# Patient Record
Sex: Female | Born: 1955
Health system: Southern US, Community
[De-identification: ages and names within clinical notes are randomized; demographics above are authoritative.]

## PROBLEM LIST (undated history)

## (undated) DIAGNOSIS — E785 Hyperlipidemia, unspecified: Secondary | ICD-10-CM

## (undated) DIAGNOSIS — I10 Essential (primary) hypertension: Secondary | ICD-10-CM

## (undated) DIAGNOSIS — M069 Rheumatoid arthritis, unspecified: Secondary | ICD-10-CM

## (undated) DIAGNOSIS — E119 Type 2 diabetes mellitus without complications: Secondary | ICD-10-CM

## (undated) HISTORY — PX: CHOLECYSTECTOMY: SHX55

## (undated) HISTORY — DX: Rheumatoid arthritis, unspecified: M06.9

## (undated) HISTORY — PX: BILATERAL OOPHORECTOMY: SHX1221

## (undated) HISTORY — PX: TUBAL LIGATION: SHX77

## (undated) HISTORY — DX: Hyperlipidemia, unspecified: E78.5

## (undated) HISTORY — DX: Essential (primary) hypertension: I10

## (undated) HISTORY — PX: BREAST CYST EXCISION: SHX579

## (undated) HISTORY — DX: Type 2 diabetes mellitus without complications: E11.9

## (undated) HISTORY — PX: TOTAL ABDOMINAL HYSTERECTOMY: SHX209

## (undated) HISTORY — PX: ABDOMINAL HYSTERECTOMY: SHX81

## (undated) HISTORY — PX: BACK SURGERY: SHX140

## (undated) HISTORY — PX: BREAST BIOPSY: SHX20

---

## 2016-06-14 LAB — LIPID PANEL
Cholesterol: 108 (ref 0–200)
HDL: 44 (ref 35–70)
LDL Cholesterol: 44
Triglycerides: 101 (ref 40–160)

## 2016-06-14 LAB — CBC AND DIFFERENTIAL
Hemoglobin: 13.7 (ref 12.0–16.0)
PLATELETS: 213 (ref 150–399)
WBC: 8.6

## 2016-06-14 LAB — MICROALBUMIN, URINE: MICROALB UR: 5.7

## 2016-06-14 LAB — HEMOGLOBIN A1C: Hemoglobin A1C: 7.7

## 2016-09-07 LAB — HEMOGLOBIN A1C
HEMOGLOBIN A1C: 6.8
HEMOGLOBIN A1C: 6.8

## 2017-02-01 ENCOUNTER — Encounter: Payer: Self-pay | Admitting: Family Medicine

## 2017-02-01 ENCOUNTER — Ambulatory Visit (INDEPENDENT_AMBULATORY_CARE_PROVIDER_SITE_OTHER): Payer: Self-pay | Admitting: Family Medicine

## 2017-02-01 VITALS — BP 135/69 | HR 79 | Ht 64.0 in | Wt 149.0 lb

## 2017-02-01 DIAGNOSIS — E785 Hyperlipidemia, unspecified: Secondary | ICD-10-CM | POA: Insufficient documentation

## 2017-02-01 DIAGNOSIS — Z86010 Personal history of colonic polyps: Secondary | ICD-10-CM

## 2017-02-01 DIAGNOSIS — F325 Major depressive disorder, single episode, in full remission: Secondary | ICD-10-CM | POA: Insufficient documentation

## 2017-02-01 DIAGNOSIS — M069 Rheumatoid arthritis, unspecified: Secondary | ICD-10-CM

## 2017-02-01 DIAGNOSIS — E119 Type 2 diabetes mellitus without complications: Secondary | ICD-10-CM

## 2017-02-01 DIAGNOSIS — Z23 Encounter for immunization: Secondary | ICD-10-CM

## 2017-02-01 DIAGNOSIS — F339 Major depressive disorder, recurrent, unspecified: Secondary | ICD-10-CM

## 2017-02-01 DIAGNOSIS — I10 Essential (primary) hypertension: Secondary | ICD-10-CM | POA: Insufficient documentation

## 2017-02-01 LAB — POCT GLYCOSYLATED HEMOGLOBIN (HGB A1C): HEMOGLOBIN A1C: 7.1

## 2017-02-01 MED ORDER — SITAGLIPTIN PHOS-METFORMIN HCL 50-1000 MG PO TABS: 1.0000 | ORAL_TABLET | Freq: Two times a day (BID) | ORAL | 5 refills | Status: DC

## 2017-02-01 MED ORDER — INSULIN NPH ISOPHANE & REGULAR (70-30) 100 UNIT/ML ~~LOC~~ SUSP: SUBCUTANEOUS | 3 refills | Status: DC

## 2017-02-01 NOTE — Progress Notes (Signed)
Subjective:    Patient ID: Ann Torres, female    DOB: 1955/07/05, 61 y.o.   MRN: [id number]  HPI Activity-year-old female here today to establish care. She is the mother of one of my current patients.  Follow-up diabetes-she would really like to come off the [place] if at all possible. Is been causing recurrent yeast infections. In fact her previous physician given her standing order to take for yeast infections but she is at the point where she would really like to take something else. She would also like to get off the glimepiride if possible. She says it doesn't make her feel well and in fact has moved to bedtime. She still feels like she's getting big swings in her blood sugar. Sometimes she'll get lows in the 30s and then other times she will have elevated blood sugars. She says in the last 5 days in fact she's been running in the mid 200s. She says she's had diabetes for almost 10 years and it's been like this where there are swings without any significant changes in diet or activity.She says when she was on Janumet a few years ago it controlled her blood sugars beautifully but unfortunately her insurance would not cover that particular medication at that time and so had to come off of it.   Review of Systems  Constitutional: Negative for diaphoresis, fever and unexpected weight change.  HENT: Positive for hearing loss and sneezing. Negative for postnasal drip and tinnitus.   Eyes: Negative for visual disturbance.  Respiratory: Positive for cough. Negative for wheezing.   Cardiovascular: Negative for chest pain and palpitations.  Genitourinary: Negative for vaginal bleeding and vaginal discharge.  Musculoskeletal: Positive for arthralgias and myalgias.  Neurological: Positive for headaches.  Hematological: Negative for adenopathy. Does not bruise/bleed easily.  Psychiatric/Behavioral: Positive for dysphoric mood.     BP 135/69   Pulse 79   Ht 5\' 4"  (1.626 m)   Wt 149 lb (67.6  kg)   SpO2 99%   BMI 25.58 kg/m     Allergies  Allergen Reactions  . Lisinopril Shortness Of Breath  . [Dapagliflozin] Other (See Comments)    Recurrent yeast infections  . Red Dye Other (See Comments)  . Methotrexate Derivatives Rash  . Plaquenil [Hydroxychloroquine Sulfate] Rash  . Tape Rash    r    History reviewed. No pertinent past medical history.  Past Surgical History:  Procedure Laterality Date  . ABDOMINAL HYSTERECTOMY    . CESAREAN SECTION     x 3  . CHOLECYSTECTOMY    . TUBAL LIGATION      Social History   Social History  . Marital status: Divorced    Spouse name: N/A  . Number of children: N/A  . Years of education: 2 yr college   Occupational History  . retired    Social History Main Topics  . Smoking status: Light Tobacco Smoker    Types: Cigarettes  . Smokeless tobacco: Never Used  . Alcohol use No  . Drug use: No  . Sexual activity: No   Other Topics Concern  . Not on file   Social History Narrative   Looks for 15 minutes daily. Denies significant caffeine intake. Currently divorced and not sexually active. Currently living with her daughter and daughter's family.    Family History  Problem Relation Age of Onset  . Colon cancer Mother   . Heart attack Father 5    Outpatient Encounter Prescriptions as of 02/01/2017  Medication Sig  . aspirin EC 81 MG tablet Take 81 mg by mouth daily.  Marland Kitchen atorvastatin (LIPITOR) 20 MG tablet Take 20 mg by mouth daily.  . citalopram (CELEXA) 40 MG tablet Take 40 mg by mouth daily.  . folic acid (FOLVITE) 1 MG tablet Take 1 mg by mouth daily.  Marland Kitchen losartan-hydrochlorothiazide (HYZAAR) 100-25 MG tablet Take 1 tablet by mouth daily.  . metFORMIN (GLUCOPHAGE) 1000 MG tablet Take 1,000 mg by mouth daily with breakfast.  . Vitamin D, Ergocalciferol, (DRISDOL) 50000 units CAPS capsule Take 50,000 Units by mouth every 7 (seven) days.  . [DISCONTINUED] dapagliflozin propanediol (FARXIGA) 10 MG TABS tablet  Take 10 mg by mouth daily.  . [DISCONTINUED] glimepiride (AMARYL) 1 MG tablet Take 1 mg by mouth daily with breakfast.  . insulin NPH-regular Human (NOVOLIN 70/30) (70-30) 100 UNIT/ML injection Given 10 units before breakfast and 8 units before evening meal. Please include syringes and needle.  . sitaGLIPtin-metformin (JANUMET) 50-1000 MG tablet Take 1 tablet by mouth 2 (two) times daily with a meal.   No facility-administered encounter medications on file as of 02/01/2017.         Objective:   Physical Exam  Constitutional: She is oriented to person, place, and time. She appears well-developed and well-nourished.  HENT:  Head: Normocephalic and atraumatic.  Neck: Neck supple. No thyromegaly present.  Cardiovascular: Normal rate, regular rhythm and normal heart sounds.   No carotid bruit.   Pulmonary/Chest: Effort normal and breath sounds normal.  Musculoskeletal: She exhibits no edema.  Neurological: She is alert and oriented to person, place, and time.  Skin: Skin is warm and dry.  Psychiatric: She has a normal mood and affect. Her behavior is normal.        Assessment & Plan:  Diabetes-uncontrolled. Hemoglobin A1c of 7.1 today. We discussed several options for her. Currently she is without health insurance or any need to stick with something is going to be reasonably affordable. For now we'll continue with metformin and add Novolin 70/30. Will start with 10 units in the morning and 8 in the evening. She is going to see how much the Janumet would cost cash price with the coupon card that I gave her today. If so then she can take that in place of the metformin and still use the insulin. She can call me after a few days and let me note her blood sugars are doing so that we can adjust the insulin if needed.  Rheumatoid arthritis-again currently without insurance is going to be very difficult to get her in with a rheumatologist at this point. For now will address this again when I see her  back in about 3 months. She was previously on methotrexate injections.  Hyperlipidemia-continue with statin.  Flu vaccine given today.

## 2017-02-01 NOTE — Patient Instructions (Signed)
Once you start the insulin, after about 3 days call me with your blood sugar numbers and we can adjust your dose.  Check your sugar before breakfast and before evening meal.

## 2017-02-03 ENCOUNTER — Telehealth: Payer: Self-pay | Admitting: *Deleted

## 2017-02-03 NOTE — Telephone Encounter (Signed)
Pt called and lvm stated that the card she was given for the janumet 50/1000 mg does not work it will cost her $784 to get this filled. She wanted to know if we can get her some samples? She also wanted to know if Dr. Linford Arnold would like her to make any changes to her current medication regimen? (i.e. Insulin dosage , metformin). Laureen Ochs, Viann Shove

## 2017-02-03 NOTE — Telephone Encounter (Signed)
Called Ann Torres and informed her that we reached out to the drug rep about getting samples for her since she was unable to get this with the card. I told her that she can go on line to [email or web address] and print off the application and fill out her part and bring that by our office and I would print off the 2nd page of the application that Dr. Linford Arnold would need to complete and we would fax this to the company and they would process the application and once she is approved she can get the medication delivered to her home. She agreed to this and will bring this by once completed.   I advised her that once she receives the Janumet she should NOT take the metformin with this medication. She should take the Janumet ONLY and the Novolin. She voiced understanding.  Ann Torres wanted to let Dr. Linford Arnold know that she did start the Novolin and she can already tell the difference. She stated that she feels better. Laureen Ochs, Viann Shove

## 2017-02-15 ENCOUNTER — Telehealth: Payer: Self-pay | Admitting: *Deleted

## 2017-02-15 NOTE — Telephone Encounter (Signed)
Called pt and informed her that samples for Janumet came in today. She stated that she is in [place] and will have either her daughter or son-in-law come by to p/u. She wanted Dr. Linford Arnold to know that her BS in the morning AC is 130 and in the evening before dinner it runs between 170-180.Ann Torres [place]

## 2017-02-16 ENCOUNTER — Encounter: Payer: Self-pay | Admitting: Family Medicine

## 2017-03-17 ENCOUNTER — Telehealth: Payer: Self-pay

## 2017-03-17 NOTE — Telephone Encounter (Signed)
Ann Torres called and states she needs a refill on Losartan-HCTZ. Historical provider.

## 2017-03-17 NOTE — Telephone Encounter (Signed)
OK to fill

## 2017-03-18 MED ORDER — LOSARTAN POTASSIUM-HCTZ 100-25 MG PO TABS: 1.0000 | ORAL_TABLET | Freq: Every day | ORAL | 1 refills | Status: DC

## 2017-03-18 NOTE — Telephone Encounter (Signed)
Left VM for Pt advising that Rx sent.

## 2017-05-06 ENCOUNTER — Ambulatory Visit: Payer: Self-pay | Admitting: Family Medicine

## 2017-05-20 ENCOUNTER — Encounter: Payer: Self-pay | Admitting: Family Medicine

## 2017-05-20 ENCOUNTER — Ambulatory Visit (INDEPENDENT_AMBULATORY_CARE_PROVIDER_SITE_OTHER): Payer: Self-pay | Admitting: Family Medicine

## 2017-05-20 VITALS — BP 134/68 | HR 81 | Ht 64.0 in | Wt 160.0 lb

## 2017-05-20 DIAGNOSIS — I10 Essential (primary) hypertension: Secondary | ICD-10-CM

## 2017-05-20 DIAGNOSIS — E119 Type 2 diabetes mellitus without complications: Secondary | ICD-10-CM

## 2017-05-20 DIAGNOSIS — M069 Rheumatoid arthritis, unspecified: Secondary | ICD-10-CM

## 2017-05-20 LAB — POCT GLYCOSYLATED HEMOGLOBIN (HGB A1C): Hemoglobin A1C: 6.9

## 2017-05-20 MED ORDER — LOSARTAN POTASSIUM-HCTZ 100-25 MG PO TABS: 1.0000 | ORAL_TABLET | Freq: Every day | ORAL | 1 refills | Status: DC

## 2017-05-20 MED ORDER — INSULIN NPH ISOPHANE & REGULAR (70-30) 100 UNIT/ML ~~LOC~~ SUSP: SUBCUTANEOUS | 3 refills | Status: DC

## 2017-05-20 MED ORDER — CITALOPRAM HYDROBROMIDE 40 MG PO TABS: 40.0000 mg | ORAL_TABLET | Freq: Every day | ORAL | 1 refills | Status: DC

## 2017-05-20 MED ORDER — ATORVASTATIN CALCIUM 20 MG PO TABS: 20.0000 mg | ORAL_TABLET | Freq: Every day | ORAL | 1 refills | Status: DC

## 2017-05-20 MED ORDER — VITAMIN D (ERGOCALCIFEROL) 1.25 MG (50000 UNIT) PO CAPS: 50000.0000 [IU] | ORAL_CAPSULE | ORAL | 0 refills | Status: DC

## 2017-05-20 MED ORDER — FOLIC ACID 1 MG PO TABS: 1.0000 mg | ORAL_TABLET | Freq: Every day | ORAL | 3 refills | Status: DC

## 2017-05-20 MED ORDER — AMBULATORY NON FORMULARY MEDICATION: 0 refills | Status: DC

## 2017-05-20 NOTE — Progress Notes (Signed)
Subjective:    CC: DM, HTN   HPI:  Diabetes - no hypoglycemic events. No wounds or sores that are not healing well. No increased thirst or urination. Checking glucose at home. Taking medications as prescribed without any side effects. Says the Janumet was causing urgent stooling.  She has to decrease her dose. She has been consistant with the insulin at 10 in AM and 8 in PM.    Hypertension- Pt denies chest pain, SOB, dizziness, or heart palpitations.  Taking meds as directed w/o problems.  Denies medication side effects.    RA - she would like a referral for Rheumatology.   She was on Enbrel years ago but says that it caused sudden hearing loss and never recovered her hearing.  She did want me to look at her feet today.  She has a few nodular areas which she thinks could be bone spurs.  They are tender at times.  She is not sure if it could also be related to her rheumatoid.  Past medical history, Surgical history, Family history not pertinant except as noted below, Social history, Allergies, and medications have been entered into the medical record, reviewed, and corrections made.   Review of Systems: No fevers, chills, night sweats, weight loss, chest pain, or shortness of breath.   Objective:    General: Well Developed, well nourished, and in no acute distress.  Neuro: Alert and oriented x3, extra-ocular muscles intact, sensation grossly intact.  HEENT: Normocephalic, atraumatic  Skin: Warm and dry, no rashes. Cardiac: Regular rate and rhythm, no murmurs rubs or gallops, no lower extremity edema.  Respiratory: Clear to auscultation bilaterally. Not using accessory muscles, speaking in full sentences. MSK: Have a little bit of hypertrophy of the bone at the proximal end of the fifth metatarsals on both feet.  She also has a knot on the outer ankle on the right side very firm like bone which could certainly be some type of spur or just bony hypertrophy.   Impression and  Recommendations:    DM-improved.  Hemoglobin A1c down to 6.9.  She really has only been able to take the Janumet once a day and sometimes twice a day due to diarrhea.  She is taking her insulin.  We will have her just stick with once a day Janumet since the medication was quite expensive and will use that up first.  In the meantime we will increase her insulin 12 units in the morning and 10 units at night.  When she runs out of the medication I would really like for her to try the extended release version.  She is currently getting her medication directly from the pharmaceutical company.  We might even be able to get samples so that she can try the XR for couple weeks just to make sure that she is going to be able to tolerate it.  HTN - Well controlled. Continue current regimen. Follow up in  3 months.  Check CMP and lipids.  She will wait probably a couple weeks until she officially gets her Medicaid card before going to the lab which is perfectly fine.  Rheumatoid arthritis-we will go ahead and place referral to get her in with rheumatology.  In the meantime I did at least like to go ahead and get a CBC on her today.  In regards to bone spurs and cysts just encouraged her to make sure that she is wearing good fitting shoes that are not too tight or rubbing the foot or  squeezing the foot.

## 2017-05-20 NOTE — Patient Instructions (Signed)
Ok to take janumet once a day.  Increase insulin to 12 units in AM and 10 units at night.

## 2017-06-13 ENCOUNTER — Telehealth: Payer: Self-pay

## 2017-06-13 MED ORDER — VITAMIN D (ERGOCALCIFEROL) 1.25 MG (50000 UNIT) PO CAPS: 50000.0000 [IU] | ORAL_CAPSULE | ORAL | 0 refills | Status: DC

## 2017-06-13 MED ORDER — OLMESARTAN MEDOXOMIL-HCTZ 40-25 MG PO TABS: 1.0000 | ORAL_TABLET | Freq: Every day | ORAL | 2 refills | Status: DC

## 2017-06-13 NOTE — Telephone Encounter (Signed)
[name] called and states Losartan-HCTZ 100-25 mg on recall/back order. Please advise.

## 2017-06-13 NOTE — Telephone Encounter (Signed)
Call patient: New prescription sent to pharmacy we can check her blood pressure again when she comes back again.  And she can always call us if she has any problems or concerns.

## 2017-06-14 NOTE — Telephone Encounter (Signed)
Pt advised.

## 2017-06-21 ENCOUNTER — Encounter: Payer: Self-pay | Admitting: Family Medicine

## 2017-06-21 MED ORDER — FOLIC ACID 1 MG PO TABS: 1.0000 mg | ORAL_TABLET | Freq: Every day | ORAL | 3 refills | Status: DC

## 2017-06-21 MED ORDER — CITALOPRAM HYDROBROMIDE 40 MG PO TABS: 40.0000 mg | ORAL_TABLET | Freq: Every day | ORAL | 1 refills | Status: DC

## 2017-07-20 ENCOUNTER — Encounter: Payer: Self-pay | Admitting: Family Medicine

## 2017-07-20 ENCOUNTER — Ambulatory Visit (INDEPENDENT_AMBULATORY_CARE_PROVIDER_SITE_OTHER): Payer: Self-pay | Admitting: Family Medicine

## 2017-07-20 VITALS — BP 126/67 | HR 77 | Ht 64.0 in | Wt 163.0 lb

## 2017-07-20 DIAGNOSIS — L237 Allergic contact dermatitis due to plants, except food: Secondary | ICD-10-CM

## 2017-07-20 MED ORDER — PREDNISONE 10 MG PO TABS: ORAL_TABLET | ORAL | 0 refills | Status: DC

## 2017-07-20 NOTE — Progress Notes (Signed)
   Subjective:    Patient ID: Ann Torres, female    DOB: 1955/06/05, 62 y.o.   MRN: [id number]  HPI pt reports that this is the 2nd time that she has broken out with the rash she stated that she has been able to use calamine lotion to help but now she is itching all over. the rash is especially bad on her face and she is worried about it getting into her eyes. She is also worried about being around her grandson. She has been working in her daughter's yard and thinks got it there.     Review of Systems     Objective:   Physical Exam  Constitutional: She is oriented to person, place, and time. She appears well-developed and well-nourished.  HENT:  Head: Normocephalic and atraumatic.  Eyes: Conjunctivae and EOM are normal.  Cardiovascular: Normal rate.  Pulmonary/Chest: Effort normal.  Neurological: She is alert and oriented to person, place, and time.  Skin: Skin is dry. No pallor.  Erythematous papules and blister on her face on both cheeks, around her eyes, and on her hands.  She has one on the mid upper chest.  She also reports a couple lesions on her thighs but did not examine those today.  Some of the rashes in a linear pattern.  Psychiatric: She has a normal mood and affect. Her behavior is normal.  Vitals reviewed.         Assessment & Plan:  Poison ivy dermatitis -gross diagnosis and discuss treatment options.  Explained that she cannot spread it but that she can still get new lesions for up to 4 weeks because of a delayed hypersensitivity reaction.  Will tx with prednisone taper.  It is still continue to use calamine if that provides some temporary relief.  Call if not improving.

## 2017-07-20 NOTE — Patient Instructions (Addendum)
Poison Ivy Dermatitis Poison ivy dermatitis is inflammation of the skin that is caused by the allergens on the leaves of the poison ivy plant. The skin reaction often involves redness, swelling, blisters, and extreme itching. What are the causes? This condition is caused by a specific chemical (urushiol) found in the sap of the poison ivy plant. This chemical is sticky and can be easily spread to people, animals, and objects. You can get poison ivy dermatitis by:  Having direct contact with a poison ivy plant.  Touching animals, other people, or objects that have come in contact with poison ivy and have the chemical on them.  What increases the risk? This condition is more likely to develop in:  People who are outdoors often.  People who go outdoors without wearing protective clothing, such as closed shoes, long pants, and a long-sleeved shirt.  What are the signs or symptoms? Symptoms of this condition include:  Redness and itching.  A rash that often includes bumps and blisters. The rash usually appears 48 hours after exposure.  Swelling. This may occur if the reaction is more severe.  Symptoms usually last for 1-2 weeks. However, the first time you develop this condition, symptoms may last 3-4 weeks. How is this diagnosed? This condition may be diagnosed based on your symptoms and a physical exam. Your health care provider may also ask you about any recent outdoor activity. How is this treated? Treatment for this condition will vary depending on how severe it is. Treatment may include:  Hydrocortisone creams or calamine lotions to relieve itching.  Oatmeal baths to soothe the skin.  Over-the-counter antihistamine tablets.  Oral steroid medicine for more severe outbreaks.  Follow these instructions at home:  Take or apply over-the-counter and prescription medicines only as told by your health care provider.  Wash exposed skin as soon as possible with soap and cold  water.  Use hydrocortisone creams or calamine lotion as needed to soothe the skin and relieve itching.  Take oatmeal baths as needed. Use colloidal oatmeal. You can get this at your local pharmacy or grocery store. Follow the instructions on the packaging.  Do not scratch or rub your skin.  While you have the rash, wash clothes right after you wear them. How is this prevented?  Learn to identify the poison ivy plant and avoid contact with the plant. This plant can be recognized by the number of leaves. Generally, poison ivy has three leaves with flowering branches on a single stem. The leaves are typically glossy, and they have jagged edges that come to a point at the front.  If you have been exposed to poison ivy, thoroughly wash with soap and water right away. You have about 30 minutes to remove the plant resin before it will cause the rash. Be sure to wash under your fingernails because any plant resin there will continue to spread the rash.  When hiking or camping, wear clothes that will help you to avoid exposure on the skin. This includes long pants, a long-sleeved shirt, tall socks, and hiking boots. You can also apply preventive lotion to your skin to help limit exposure.  If you suspect that your clothes or outdoor gear came in contact with poison ivy, rinse them off outside with a garden hose before you bring them inside your house. Contact a health care provider if:  You have open sores in the rash area.  You have more redness, swelling, or pain in the affected area.  You have   redness that spreads beyond the rash area.  You have fluid, blood, or pus coming from the affected area.  You have a fever.  You have a rash over a large area of your body.  You have a rash on your eyes, mouth, or genitals.  Your rash does not improve after a few days. Get help right away if:  Your face swells or your eyes swell shut.  You have trouble breathing.  You have trouble  swallowing. This information is not intended to replace advice given to you by your health care provider. Make sure you discuss any questions you have with your health care provider. Document Released: 04/16/2000 Document Revised: 09/25/2015 Document Reviewed: 09/25/2014 Elsevier Interactive Patient Education  2018 [employer]  

## 2017-08-23 LAB — COMPLETE METABOLIC PANEL WITH GFR
AG RATIO: 1.9 (calc) (ref 1.0–2.5)
ALT: 17 U/L (ref 6–29)
AST: 16 U/L (ref 10–35)
Albumin: 4.4 g/dL (ref 3.6–5.1)
Alkaline phosphatase (APISO): 84 U/L (ref 33–130)
BUN: 9 mg/dL (ref 7–25)
CALCIUM: 9.7 mg/dL (ref 8.6–10.4)
CO2: 28 mmol/L (ref 20–32)
CREATININE: 0.72 mg/dL (ref 0.50–0.99)
Chloride: 102 mmol/L (ref 98–110)
GFR, EST AFRICAN AMERICAN: 104 mL/min/{1.73_m2} (ref >=60)
GFR, EST NON AFRICAN AMERICAN: 90 mL/min/{1.73_m2} (ref >=60)
Globulin: 2.3 g/dL (calc) (ref 1.9–3.7)
Glucose, Bld: 181 mg/dL — ABNORMAL HIGH (ref 65–99)
Potassium: 3.5 mmol/L (ref 3.5–5.3)
Sodium: 139 mmol/L (ref 135–146)
TOTAL PROTEIN: 6.7 g/dL (ref 6.1–8.1)
Total Bilirubin: 0.5 mg/dL (ref 0.2–1.2)

## 2017-08-23 LAB — CBC
HCT: 39.1 % (ref 35.0–45.0)
Hemoglobin: 13.4 g/dL (ref 11.7–15.5)
MCH: 26.9 pg — AB (ref 27.0–33.0)
MCHC: 34.3 g/dL (ref 32.0–36.0)
MCV: 78.5 fL — ABNORMAL LOW (ref 80.0–100.0)
MPV: 9.9 fL (ref 7.5–12.5)
PLATELETS: 241 10*3/uL (ref 140–400)
RBC: 4.98 10*6/uL (ref 3.80–5.10)
RDW: 13.7 % (ref 11.0–15.0)
WBC: 9.3 10*3/uL (ref 3.8–10.8)

## 2017-08-23 LAB — LIPID PANEL
CHOL/HDL RATIO: 3.1 (calc) (ref <5.0)
Cholesterol: 154 mg/dL (ref <200)
HDL: 49 mg/dL — ABNORMAL LOW (ref >50)
LDL CHOLESTEROL (CALC): 73 mg/dL
NON-HDL CHOLESTEROL (CALC): 105 mg/dL (ref <130)
Triglycerides: 229 mg/dL — ABNORMAL HIGH (ref <150)

## 2017-08-24 ENCOUNTER — Other Ambulatory Visit: Payer: Self-pay | Admitting: Family Medicine

## 2017-08-24 LAB — SEDIMENTATION RATE: Sed Rate: 9 mm/h (ref 0–30)

## 2017-08-26 ENCOUNTER — Ambulatory Visit (INDEPENDENT_AMBULATORY_CARE_PROVIDER_SITE_OTHER): Payer: BLUE CROSS/BLUE SHIELD | Admitting: Family Medicine

## 2017-08-26 ENCOUNTER — Encounter: Payer: Self-pay | Admitting: Family Medicine

## 2017-08-26 VITALS — BP 119/73 | HR 88 | Ht 64.0 in | Wt 165.0 lb

## 2017-08-26 DIAGNOSIS — M79672 Pain in left foot: Secondary | ICD-10-CM

## 2017-08-26 DIAGNOSIS — I10 Essential (primary) hypertension: Secondary | ICD-10-CM | POA: Diagnosis not present

## 2017-08-26 DIAGNOSIS — E119 Type 2 diabetes mellitus without complications: Secondary | ICD-10-CM | POA: Diagnosis not present

## 2017-08-26 LAB — POCT GLYCOSYLATED HEMOGLOBIN (HGB A1C): Hemoglobin A1C: 7.5

## 2017-08-26 MED ORDER — HYDROCHLOROTHIAZIDE 25 MG PO TABS: 25.0000 mg | ORAL_TABLET | Freq: Every day | ORAL | 3 refills | Status: DC

## 2017-08-26 MED ORDER — SITAGLIP PHOS-METFORMIN HCL ER 50-1000 MG PO TB24: 1.0000 | ORAL_TABLET | Freq: Every day | ORAL | 5 refills | Status: DC

## 2017-08-26 MED ORDER — TELMISARTAN 80 MG PO TABS: 80.0000 mg | ORAL_TABLET | Freq: Every day | ORAL | 5 refills | Status: DC

## 2017-08-26 NOTE — Progress Notes (Signed)
Subjective:    CC: DM, HTN  HPI:  Diabetes - no hypoglycemic events. No wounds or sores that are not healing well. No increased thirst or urination. Checking glucose at home. Taking medications as prescribed without any side effects. Says the benicar is too expensive.  Would like something else.   Hypertension- Pt denies chest pain, SOB, dizziness, or heart palpitations.  Taking meds as directed w/o problems.  Denies medication side effects.    Left posterior heel pain -is noticed a hard nodule in the posterior left heel is where the Achilles attaches.  She does not member any trauma or injury but she does have rheumatoid and she is not currently on medications.  She has other similar type calcifications on her body from rheumatoid.  She says it is tender so she is been mostly wearing flip-flops.  She wants to know how she can get it to go away.    Objective:    General: Well Developed, well nourished, and in no acute distress.  Neuro: Alert and oriented x3, extra-ocular muscles intact, sensation grossly intact.  HEENT: Normocephalic, atraumatic  Skin: Warm and dry, no rashes. Cardiac: Regular rate and rhythm, no murmurs rubs or gallops, no lower extremity edema.  Respiratory: Clear to auscultation bilaterally. Not using accessory muscles, speaking in full sentences. MSK: Left posterior heel pain with a knot present.  No erythema but it is tender to touch but it is very firm much like bone with may be just a little bit of induration.   Impression and Recommendations:    HTN - Well controlled. Continue current regimen. Follow up in  6 month.    DM - not well controlled. Will change to Janumet XR.  This will actually allow her to get in the full dose which she is only currently getting half of and this should help her better control her hemoglobin A1c.  Really watch diet. Cut out the daily icecream.  Encouraged her to continue to stay active.  Left posterior heel pain/with  swelling-suspect that it may actually be a rheumatoid nodule.  Possibly a heel spur.  Recommend x-ray for further evaluation.  Right now she does not have health insurance and so wants to wait until the summer to have it done.  Imaging ordered and she can go at any time.

## 2017-08-30 ENCOUNTER — Encounter: Payer: Self-pay | Admitting: Family Medicine

## 2017-08-31 ENCOUNTER — Other Ambulatory Visit: Payer: Self-pay | Admitting: *Deleted

## 2017-08-31 MED ORDER — TELMISARTAN 80 MG PO TABS: 80.0000 mg | ORAL_TABLET | Freq: Every day | ORAL | 5 refills | Status: DC

## 2017-09-14 ENCOUNTER — Ambulatory Visit (INDEPENDENT_AMBULATORY_CARE_PROVIDER_SITE_OTHER): Payer: BLUE CROSS/BLUE SHIELD

## 2017-09-14 DIAGNOSIS — M79672 Pain in left foot: Secondary | ICD-10-CM | POA: Diagnosis not present

## 2017-10-03 ENCOUNTER — Encounter: Payer: Self-pay | Admitting: Family Medicine

## 2017-10-06 ENCOUNTER — Ambulatory Visit (INDEPENDENT_AMBULATORY_CARE_PROVIDER_SITE_OTHER): Payer: BLUE CROSS/BLUE SHIELD | Admitting: Family Medicine

## 2017-10-06 ENCOUNTER — Encounter: Payer: Self-pay | Admitting: Family Medicine

## 2017-10-06 DIAGNOSIS — M9262 Juvenile osteochondrosis of tarsus, left ankle: Secondary | ICD-10-CM

## 2017-10-06 DIAGNOSIS — M6528 Calcific tendinitis, other site: Secondary | ICD-10-CM | POA: Diagnosis not present

## 2017-10-06 MED ORDER — NITROGLYCERIN 0.2 MG/HR TD PT24: MEDICATED_PATCH | TRANSDERMAL | 1 refills | Status: DC

## 2017-10-06 NOTE — Patient Instructions (Signed)
Thank you for coming in today. Do the heel stretches.  30 reps 2-3x dialy Remember to go down slowly.  Use shoes with an open back and a bit of a heel lift. Clogs are generally helpful.  Ice is helpful for about 10 - 20 mins 1-2x daily.  Recheck with me in 1 month.  Return sooner if needed.   Nitroglycerin Protocol   Apply 1/4 nitroglycerin patch to affected area daily.  Change position of patch within the affected area every 24 hours.  You may experience a headache during the first 1-2 weeks of using the patch, these should subside.  If you experience headaches after beginning nitroglycerin patch treatment, you may take your preferred over the counter pain reliever.  Another side effect of the nitroglycerin patch is skin irritation or rash related to patch adhesive.  Please notify our office if you develop more severe headaches or rash, and stop the patch.  Tendon healing with nitroglycerin patch may require 12 to 24 weeks depending on the extent of injury.  Men should not use if taking Viagra, Cialis, or Levitra.   Do not use if you have migraines or rosacea.

## 2017-10-06 NOTE — Progress Notes (Signed)
Subjective:    I'm seeing this patient as a consultation for:  Ann Games, MD   CC: Left Achilles tendon nodule  HPI: Ann Torres notes a 43-month history of pain and swelling at the posterior aspect of her left heel.  She denies any specific injury.  She was seen by her primary care provider in April who provided some home exercise program for Achilles tendinitis and tendon nodule.  She notes this is been only minimally helpful.  She notes that she has trouble wearing normal shoes because her heel is swollen and painful.  She denies any fevers or chills.  She has a pertinent past medical history for rheumatoid arthritis not currently on any treatment.  She did not have significant treatment for rheumatoid arthritis recently because she previously lacked health insurance.  She is currently the process of reestablishing care with a rheumatologist.  Past medical history, Surgical history, Family history not pertinant except as noted below, Social history, Allergies, and medications have been entered into the medical record, reviewed, and no changes needed.   Review of Systems: No headache, visual changes, nausea, vomiting, diarrhea, constipation, dizziness, abdominal pain, skin rash, fevers, chills, night sweats, weight loss, swollen lymph nodes, body aches, joint swelling, muscle aches, chest pain, shortness of breath, mood changes, visual or auditory hallucinations.   Objective:    Vitals:   10/06/17 0948  BP: 125/65  Pulse: 73   General: Well Developed, well nourished, and in no acute distress.  Neuro/Psych: Alert and oriented x3, extra-ocular muscles intact, able to move all 4 extremities, sensation grossly intact. Skin: Warm and dry, no rashes noted.  Respiratory: Not using accessory muscles, speaking in full sentences, trachea midline.  Cardiovascular: Pulses palpable, no extremity edema. Abdomen: Does not appear distended. MSK:  Left heel significant nodule swelling at the  posterior calcaneus near the insertion of the Achilles tendon.  Tender to palpation. Ankle motion is intact over some pain with  full dorsiflexion. Pulses capillary fill and sensation are intact.  Limited musculoskeletal ultrasound of the left Achilles tendon reveals a large subcutaneous nodule with increased vascular activity on Doppler exam.  The Achilles tendon is intact however there is significant hyperechoic calcification seen at the medial portion of the distal Achilles tendon insertion.  No full-thickness Achilles tendon tear present. Impression: Chronic Achilles tendinitis with nodule and Haglund deformity.  EXAM: LEFT FOOT - COMPLETE 3+ VIEW  COMPARISON:  None.  FINDINGS: There is no evidence of fracture or dislocation. There is mild osteoarthritis of the first MTP joint. There are enthesopathic changes of the Achilles tendon insertion. Soft tissues are unremarkable.  IMPRESSION: No acute osseous injury of the left foot.   Electronically Signed   By: Elige Ko   On: 09/14/2017 14:23 I personally (independently) visualized and performed the interpretation of the images attached in this note.   Impression and Recommendations:    Assessment and Plan: 62 y.o. female with  Left Achilles tendinitis, Achilles tendon nodule, and Haglund deformity.  Patient is quite symptomatic at this point.  We discussed treatment options.  Plan for a trial of home exercise plan focus on eccentric exercises, nitroglycerin patches, ice, and appropriate footwear.  Recommend avoiding shoes with a significant heel cup.  Additionally I do recommend using a bit of a heel lift.  Clogs would be a good option. Recheck in about a month.  Return sooner if needed.  Ultimately if not better next steps would be MRI and potential surgical evaluation.  PRP  may be an option as well.   No orders of the defined types were placed in this encounter.  Meds ordered this encounter  Medications  .  nitroGLYCERIN (NITRODUR - DOSED IN MG/24 HR) 0.2 mg/hr patch    Sig: 1/4 patch to achilles tendon daily for tendonitis    Dispense:  30 patch    Refill:  1    Discussed warning signs or symptoms. Please see discharge instructions. Patient expresses understanding.

## 2017-10-10 ENCOUNTER — Encounter: Payer: Self-pay | Admitting: Family Medicine

## 2017-10-26 DIAGNOSIS — Z79899 Other long term (current) drug therapy: Secondary | ICD-10-CM | POA: Diagnosis not present

## 2017-10-26 DIAGNOSIS — M150 Primary generalized (osteo)arthritis: Secondary | ICD-10-CM | POA: Diagnosis not present

## 2017-10-26 DIAGNOSIS — M069 Rheumatoid arthritis, unspecified: Secondary | ICD-10-CM | POA: Diagnosis not present

## 2017-11-07 ENCOUNTER — Ambulatory Visit (INDEPENDENT_AMBULATORY_CARE_PROVIDER_SITE_OTHER): Payer: BLUE CROSS/BLUE SHIELD | Admitting: Family Medicine

## 2017-11-07 VITALS — BP 156/81 | HR 64 | Wt 169.0 lb

## 2017-11-07 DIAGNOSIS — M6528 Calcific tendinitis, other site: Secondary | ICD-10-CM | POA: Diagnosis not present

## 2017-11-07 DIAGNOSIS — M9262 Juvenile osteochondrosis of tarsus, left ankle: Secondary | ICD-10-CM | POA: Diagnosis not present

## 2017-11-07 NOTE — Progress Notes (Signed)
Ann Torres is a 62 y.o. female who presents to [facility] today for follow-up Achilles tendinitis.  Patient was seen about a month ago for left Achilles tendinitis and tendon nodule and Haglund deformity.  She was treated with eccentric exercises and nitroglycerin patch protocol.  She notes significant improvement in symptoms and decreasing size and pain and swelling of the Haglund deformity at the posterior left calcaneus.  At the same time additionally she got reestablished with rheumatology for rheumatoid arthritis and has been restarted on Plaquenil for the last week or so.  She is not sure if she is feeling any better on it yet because that she just got started on her medication.    She feels well with how things are going.     ROS:  As above  Exam:  BP (!) 156/81   Pulse 64   Wt 169 lb (76.7 kg)   BMI 29.01 kg/m  General: Well Developed, well nourished, and in no acute distress.  Neuro/Psych: Alert and oriented x3, extra-ocular muscles intact, able to move all 4 extremities, sensation grossly intact. Skin: Warm and dry, no rashes noted.  Respiratory: Not using accessory muscles, speaking in full sentences, trachea midline.  Cardiovascular: Pulses palpable, no extremity edema. Abdomen: Does not appear distended. MSK:  Left foot significant nodule at the posterior calcaneus mildly tender to touch.  Not particularly erythematous.  Normal foot and ankle motion.  Pulses capillary refill and sensation are intact.     Assessment and Plan: 62 y.o. female with left calcific Achilles tendinitis Haglund deformity and nodule.  Improving with nitroglycerin patches and eccentric exercises.  Plan to continue home exercise program and nitroglycerin patches for 2 more months for a 69-month course.  Discontinue nitroglycerin at that time and inform me how things are going.  Additionally appropriate treatment of rheumatoid arthritis will likely  benefit the Achilles tendinitis as well.  Patient will see me message in 2 months to let me know how she is doing.  I spent 15 minutes with this patient, greater than 50% was face-to-face time counseling regarding treatment plan.   Historical information moved to improve visibility of documentation.  No past medical history on file. Past Surgical History:  Procedure Laterality Date  . ABDOMINAL HYSTERECTOMY    . CESAREAN SECTION     x 3  . CHOLECYSTECTOMY    . TUBAL LIGATION     Social History   Tobacco Use  . Smoking status: Light Tobacco Smoker    Types: Cigarettes  . Smokeless tobacco: Never Used  Substance Use Topics  . Alcohol use: No   family history includes Colon cancer in her mother; Heart attack (age of onset: 104) in her father.  Medications: Current Outpatient Medications  Medication Sig Dispense Refill  . AMBULATORY NON FORMULARY MEDICATION Medication Name:  4 point cane dx:M06.9 1 each 0  . aspirin EC 81 MG tablet Take 81 mg by mouth daily.    Marland Kitchen atorvastatin (LIPITOR) 20 MG tablet Take 1 tablet (20 mg total) by mouth daily at 6 PM. 90 tablet 1  . citalopram (CELEXA) 40 MG tablet Take 1 tablet (40 mg total) by mouth daily. 90 tablet 1  . folic acid (FOLVITE) 1 MG tablet Take 1 tablet (1 mg total) by mouth daily. 90 tablet 3  . hydrochlorothiazide (HYDRODIURIL) 25 MG tablet Take 1 tablet (25 mg total) by mouth daily. 90 tablet 3  . insulin NPH-regular Human (NOVOLIN 70/30) (70-30)  100 UNIT/ML injection Given 10 units before breakfast and 8 units before evening meal. Please include syringes and needle. 10 mL 3  . nitroGLYCERIN (NITRODUR - DOSED IN MG/24 HR) 0.2 mg/hr patch 1/4 patch to achilles tendon daily for tendonitis 30 patch 1  . SitaGLIPtin-MetFORMIN HCl (JANUMET XR) 50-1000 MG TB24 Take 1 tablet by mouth daily. 30 tablet 5  . telmisartan (MICARDIS) 80 MG tablet Take 1 tablet (80 mg total) by mouth daily. 30 tablet 5  . Vitamin D, Ergocalciferol, (DRISDOL)  50000 units CAPS capsule Take 1 capsule (50,000 Units total) by mouth every 7 (seven) days. 12 capsule 0   No current facility-administered medications for this visit.    Allergies  Allergen Reactions  . Lisinopril Shortness Of Breath  . Marcelline Deist [Dapagliflozin] Other (See Comments)    Recurrent yeast infections  . Red Dye Other (See Comments)  . Methotrexate Derivatives Rash  . Plaquenil [Hydroxychloroquine Sulfate] Rash  . Tape Rash    r      Discussed warning signs or symptoms. Please see discharge instructions. Patient expresses understanding.

## 2017-11-07 NOTE — Patient Instructions (Signed)
Thank you for coming in today. Continue the nitro patches.  Continue the home exercises.  Return as needed.  Stop the patches in about 2 months.  Let me know how you do.    Achilles Tendinitis Achilles tendinitis is inflammation of the tough, cord-like band that attaches the lower leg muscles to the heel bone (Achilles tendon). This is usually caused by overusing the tendon and the ankle joint. Achilles tendinitis usually gets better over time with treatment and caring for yourself at home. It can take weeks or months to heal completely. What are the causes? This condition may be caused by:  A sudden increase in exercise or activity, such as running.  Doing the same exercises or activities (such as jumping) over and over.  Not warming up calf muscles before exercising.  Exercising in shoes that are worn out or not made for exercise.  Having arthritis or a bone growth (spur) on the back of the heel bone. This can rub against the tendon and hurt it.  Age-related wear and tear. Tendons become less flexible with age and more likely to be injured.  What are the signs or symptoms? Common symptoms of this condition include:  Pain in the Achilles tendon or in the back of the leg, just above the heel. The pain usually gets worse with exercise.  Stiffness or soreness in the back of the leg, especially in the morning.  Swelling of the skin over the Achilles tendon.  Thickening of the tendon.  Bone spurs at the bottom of the Achilles tendon, near the heel.  Trouble standing on tiptoe.  How is this diagnosed? This condition is diagnosed based on your symptoms and a physical exam. You may have tests, including:  X-rays.  MRI.  How is this treated? The goal of treatment is to relieve symptoms and help your injury heal. Treatment may include:  Decreasing or stopping activities that caused the tendinitis. This may mean switching to low-impact exercises like biking or  swimming.  Icing the injured area.  Doing physical therapy, including strengthening and stretching exercises.  NSAIDs to help relieve pain and swelling.  Using supportive shoes, wraps, heel lifts, or a walking boot (air cast).  Surgery. This may be done if your symptoms do not improve after 6 months.  Using high-energy shock wave impulses to stimulate the healing process (extracorporeal shock wave therapy). This is rare.  Injection of medicines to help relieve inflammation (corticosteroids). This is rare.  Follow these instructions at home: If you have an air cast:  Wear the cast as told by your health care provider. Remove it only as told by your health care provider.  Loosen the cast if your toes tingle, become numb, or turn cold and blue. Activity  Gradually return to your normal activities once your health care provider approves. Do not do activities that cause pain. ? Consider doing low-impact exercises, like cycling or swimming.  If you have an air cast, ask your health care provider when it is safe for you to drive.  If physical therapy was prescribed, do exercises as told by your health care provider or physical therapist. Managing pain, stiffness, and swelling  Raise (elevate) your foot above the level of your heart while you are sitting or lying down.  Move your toes often to avoid stiffness and to lessen swelling.  If directed, put ice on the injured area: ? Put ice in a plastic bag. ? Place a towel between your skin and the bag. ?  Leave the ice on for 20 minutes, 2-3 times a day General instructions  If directed, wrap your foot with an elastic bandage or other wrap. This can help keep your tendon from moving too much while it heals. Your health care provider will show you how to wrap your foot correctly.  Wear supportive shoes or heel lifts only as told by your health care provider.  Take over-the-counter and prescription medicines only as told by your health  care provider.  Keep all follow-up visits as told by your health care provider. This is important. Contact a health care provider if:  You have symptoms that gets worse.  You have pain that does not get better with medicine.  You develop new, unexplained symptoms.  You develop warmth and swelling in your foot.  You have a fever. Get help right away if:  You have a sudden popping sound or sensation in your Achilles tendon followed by severe pain.  You cannot move your toes or foot.  You cannot put any weight on your foot. Summary  Achilles tendinitis is inflammation of the tough, cord-like band that attaches the lower leg muscles to the heel bone (Achilles tendon).  This condition is usually caused by overusing the tendon and the ankle joint. It can also be caused by arthritis or normal aging.  The most common symptoms of this condition include pain, swelling, or stiffness in the Achilles tendon or in the back of the leg.  This condition is usually treated with rest, NSAIDs, and physical therapy. This information is not intended to replace advice given to you by your health care provider. Make sure you discuss any questions you have with your health care provider. Document Released: 01/27/2005 Document Revised: 03/08/2016 Document Reviewed: 03/08/2016 Elsevier Interactive Patient Education  2017 [employer].

## 2017-12-28 ENCOUNTER — Other Ambulatory Visit: Payer: Self-pay | Admitting: Family Medicine

## 2017-12-28 MED ORDER — ATORVASTATIN CALCIUM 20 MG PO TABS: 20.0000 mg | ORAL_TABLET | Freq: Every day | ORAL | 1 refills | Status: DC

## 2017-12-30 ENCOUNTER — Ambulatory Visit: Payer: Self-pay | Admitting: Family Medicine

## 2018-01-18 ENCOUNTER — Ambulatory Visit: Payer: Self-pay | Admitting: Family Medicine

## 2018-01-24 ENCOUNTER — Encounter: Payer: Self-pay | Admitting: Family Medicine

## 2018-01-24 ENCOUNTER — Ambulatory Visit (INDEPENDENT_AMBULATORY_CARE_PROVIDER_SITE_OTHER): Payer: BLUE CROSS/BLUE SHIELD | Admitting: Family Medicine

## 2018-01-24 VITALS — BP 129/69 | HR 95 | Ht 64.0 in | Wt 165.0 lb

## 2018-01-24 DIAGNOSIS — E119 Type 2 diabetes mellitus without complications: Secondary | ICD-10-CM | POA: Diagnosis not present

## 2018-01-24 DIAGNOSIS — R1013 Epigastric pain: Secondary | ICD-10-CM

## 2018-01-24 DIAGNOSIS — I10 Essential (primary) hypertension: Secondary | ICD-10-CM | POA: Diagnosis not present

## 2018-01-24 DIAGNOSIS — Z23 Encounter for immunization: Secondary | ICD-10-CM

## 2018-01-24 LAB — POCT GLYCOSYLATED HEMOGLOBIN (HGB A1C): Hemoglobin A1C: 8 % — AB (ref 4.0–5.6)

## 2018-01-24 MED ORDER — DULAGLUTIDE 0.75 MG/0.5ML ~~LOC~~ SOAJ: 0.7500 mg | SUBCUTANEOUS | 3 refills | Status: DC

## 2018-01-24 MED ORDER — OMEPRAZOLE 20 MG PO CPDR: 20.0000 mg | DELAYED_RELEASE_CAPSULE | Freq: Every day | ORAL | 3 refills | Status: DC

## 2018-01-24 NOTE — Progress Notes (Signed)
Subjective:    CC: DM  HPI:  Diabetes - no hypoglycemic events. No wounds or sores that are not healing well. No increased thirst or urination. Checking glucose at home. Taking medications as prescribed without any side effects.  She was not able to tolerate the Janumet XR.  We decided to try the XR when I saw her earlier this year thinking that the XR metformin might be tolerated better but unfortunately caused diarrhea and vomiting so she discontinued it.  She has been using her NPH 12 units before breakfast and 10 units before her evening meal.  Hypertension- Pt denies chest pain, SOB, dizziness, or heart palpitations.  Taking meds as directed w/o problems.  Denies medication side effects.    Complains of epigastric pain that has been going on really for months.  Sometimes she has vomiting and sometimes she has diarrhea.  She feels like a lot of it was medication side effect but right now she is really not on much medication and she is still having pain in her epigastric area after she eats.  She does have a prior history of bleeding ulcers when she was in her 46s.  She has not been taking any type of NSAIDs or stomach irritant recently.  Rheumatoid arthritis-her rheumatologist, Dr. Allena Katz is trying to get her approved for Orencia since she has not tolerated methotrexate, or Plaquenil or Enbrel.  Past medical history, Surgical history, Family history not pertinant except as noted below, Social history, Allergies, and medications have been entered into the medical record, reviewed, and corrections made.   Review of Systems: No fevers, chills, night sweats, weight loss, chest pain, or shortness of breath.   Objective:    General: Well Developed, well nourished, and in no acute distress.  Neuro: Alert and oriented x3, extra-ocular muscles intact, sensation grossly intact.  HEENT: Normocephalic, atraumatic  Skin: Warm and dry, no rashes. Cardiac: Regular rate and rhythm, no murmurs rubs or  gallops, no lower extremity edema.  Respiratory: Clear to auscultation bilaterally. Not using accessory muscles, speaking in full sentences. Abd: Normal bowel sounds, soft, tender in the epigastric area and left upper quadrant.  No rebound, guarding, or mass palpable.  Impression and Recommendations:    DM - uncontrolled.  Gust options.  We had previously discussed Trulicity as an option she is willing to try it.  New prescription sent to pharmacy we will add metformin to her intolerance list.  We may be able to consider adding the Januvia back separately.  HTN - Well controlled. Continue current regimen. Follow up in  6 months.    Epigastric pain with nausea-most consistent with a gastric ulcer based on her symptoms.  She was tender in the left upper quadrant and epigastric area on exam today.  We will get some additional labs just to rule out infection, pancreatitis and hepatitis I am to go ahead and start her on a PPI.  If that she is noticing improvement after 10 days and will continue therapy for minimum of 6 weeks.  If she is not noticing any improvement after 10 days and she will give me a call back and we will refer her to GI for further work-up.  Rheumatoid t arthritis-working with Dr. Allena Katz and try to get the rinse yet approved.

## 2018-01-24 NOTE — Patient Instructions (Signed)
Call me back if your stomach isn't better in 10 days on the omeprazole.   You can also take TUMS if needed after meals.

## 2018-01-25 ENCOUNTER — Encounter: Payer: Self-pay | Admitting: Family Medicine

## 2018-01-30 ENCOUNTER — Encounter: Payer: Self-pay | Admitting: Family Medicine

## 2018-01-30 DIAGNOSIS — R1013 Epigastric pain: Secondary | ICD-10-CM | POA: Diagnosis not present

## 2018-01-30 DIAGNOSIS — E119 Type 2 diabetes mellitus without complications: Secondary | ICD-10-CM | POA: Diagnosis not present

## 2018-01-30 DIAGNOSIS — Z23 Encounter for immunization: Secondary | ICD-10-CM | POA: Diagnosis not present

## 2018-01-30 DIAGNOSIS — I10 Essential (primary) hypertension: Secondary | ICD-10-CM | POA: Diagnosis not present

## 2018-01-31 LAB — CBC WITH DIFFERENTIAL/PLATELET
BASOS PCT: 0.4 %
Basophils Absolute: 42 cells/uL (ref 0–200)
Eosinophils Absolute: 105 cells/uL (ref 15–500)
Eosinophils Relative: 1 %
HCT: 42.4 % (ref 35.0–45.0)
HEMOGLOBIN: 14.4 g/dL (ref 11.7–15.5)
Lymphs Abs: 4263 cells/uL — ABNORMAL HIGH (ref 850–3900)
MCH: 26.2 pg — ABNORMAL LOW (ref 27.0–33.0)
MCHC: 34 g/dL (ref 32.0–36.0)
MCV: 77.2 fL — AB (ref 80.0–100.0)
MPV: 10.4 fL (ref 7.5–12.5)
Monocytes Relative: 6.4 %
NEUTROS ABS: 5418 {cells}/uL (ref 1500–7800)
Neutrophils Relative %: 51.6 %
Platelets: 277 10*3/uL (ref 140–400)
RBC: 5.49 10*6/uL — ABNORMAL HIGH (ref 3.80–5.10)
RDW: 13.6 % (ref 11.0–15.0)
Total Lymphocyte: 40.6 %
WBC: 10.5 10*3/uL (ref 3.8–10.8)
WBCMIX: 672 {cells}/uL (ref 200–950)

## 2018-01-31 LAB — COMPLETE METABOLIC PANEL WITH GFR
AG Ratio: 1.6 (calc) (ref 1.0–2.5)
ALT: 18 U/L (ref 6–29)
AST: 17 U/L (ref 10–35)
Albumin: 4.5 g/dL (ref 3.6–5.1)
Alkaline phosphatase (APISO): 85 U/L (ref 33–130)
BUN: 13 mg/dL (ref 7–25)
CALCIUM: 9.4 mg/dL (ref 8.6–10.4)
CO2: 25 mmol/L (ref 20–32)
CREATININE: 0.77 mg/dL (ref 0.50–0.99)
Chloride: 97 mmol/L — ABNORMAL LOW (ref 98–110)
GFR, EST NON AFRICAN AMERICAN: 82 mL/min/{1.73_m2} (ref >=60)
GFR, Est African American: 95 mL/min/{1.73_m2} (ref >=60)
Globulin: 2.9 g/dL (calc) (ref 1.9–3.7)
Glucose, Bld: 129 mg/dL — ABNORMAL HIGH (ref 65–99)
Potassium: 3.4 mmol/L — ABNORMAL LOW (ref 3.5–5.3)
Sodium: 135 mmol/L (ref 135–146)
TOTAL PROTEIN: 7.4 g/dL (ref 6.1–8.1)
Total Bilirubin: 0.8 mg/dL (ref 0.2–1.2)

## 2018-01-31 LAB — SPECIMEN COMPROMISED

## 2018-01-31 LAB — AMYLASE: Amylase: 34 U/L (ref 21–101)

## 2018-01-31 LAB — LIPASE: LIPASE: 19 U/L (ref 7–60)

## 2018-02-06 DIAGNOSIS — M0579 Rheumatoid arthritis with rheumatoid factor of multiple sites without organ or systems involvement: Secondary | ICD-10-CM | POA: Diagnosis not present

## 2018-02-06 DIAGNOSIS — Z79899 Other long term (current) drug therapy: Secondary | ICD-10-CM | POA: Insufficient documentation

## 2018-02-16 ENCOUNTER — Encounter: Payer: Self-pay | Admitting: Family Medicine

## 2018-02-28 ENCOUNTER — Ambulatory Visit: Payer: BLUE CROSS/BLUE SHIELD | Admitting: Family Medicine

## 2018-02-28 ENCOUNTER — Encounter: Payer: Self-pay | Admitting: Family Medicine

## 2018-03-01 MED ORDER — DULAGLUTIDE 1.5 MG/0.5ML ~~LOC~~ SOAJ: 1.5000 mg | SUBCUTANEOUS | 3 refills | Status: DC

## 2018-03-22 DIAGNOSIS — H2512 Age-related nuclear cataract, left eye: Secondary | ICD-10-CM | POA: Diagnosis not present

## 2018-03-22 LAB — HM DIABETES EYE EXAM

## 2018-03-23 ENCOUNTER — Encounter: Payer: Self-pay | Admitting: Family Medicine

## 2018-03-23 ENCOUNTER — Ambulatory Visit (INDEPENDENT_AMBULATORY_CARE_PROVIDER_SITE_OTHER): Payer: BLUE CROSS/BLUE SHIELD | Admitting: Family Medicine

## 2018-03-23 VITALS — BP 127/77 | HR 64 | Ht 64.0 in | Wt 167.0 lb

## 2018-03-23 DIAGNOSIS — E119 Type 2 diabetes mellitus without complications: Secondary | ICD-10-CM | POA: Diagnosis not present

## 2018-03-23 DIAGNOSIS — F339 Major depressive disorder, recurrent, unspecified: Secondary | ICD-10-CM | POA: Diagnosis not present

## 2018-03-23 DIAGNOSIS — F5101 Primary insomnia: Secondary | ICD-10-CM | POA: Insufficient documentation

## 2018-03-23 MED ORDER — ZOLPIDEM TARTRATE 5 MG PO TABS: 2.5000 mg | ORAL_TABLET | Freq: Every evening | ORAL | 0 refills | Status: DC | PRN

## 2018-03-23 MED ORDER — CITALOPRAM HYDROBROMIDE 20 MG PO TABS: ORAL_TABLET | ORAL | 1 refills | Status: DC

## 2018-03-23 MED ORDER — SITAGLIPTIN PHOSPHATE 100 MG PO TABS: 100.0000 mg | ORAL_TABLET | Freq: Every day | ORAL | 5 refills | Status: DC

## 2018-03-23 NOTE — Progress Notes (Addendum)
Subjective:    CC: DM and sleep issues  HPI:  Diabetes - no hypoglycemic events. No wounds or sores that are not healing well. No increased thirst or urination. Checking glucose at home, fasting 120. Taking medications as prescribed without any side effects.  He increase to the higher dose Trulicity pen about 3 weeks ago. Her sugars are doing a little better.  Her fastings are normally around 120.  Before her first dose of insulin sugars are normally running around 160 and in the evenings around 190.  F/U Insomnia -she says for quite some time she is had difficulty sleeping.  She is really only getting about 2 hours of sleep at night she is been falling asleep easily during the daytime.  She says often she will fall asleep watching TV around 8:00 at night and then wake up and try to go to bed and then cannot fall back asleep until about 2 AM and then may be gets about 2 hours.  She says this been quite some time that she is had difficulty falling asleep and staying asleep.  She wonders if she could try medication such as Ambien she is never really taken anything like that before.  She reports that her bedroom is dark and cool and quiet.  She does not consume caffeine.  Not watch TV an hour before bedtime.  She also would like to restart her citalopram. She feels like her depressive symptoms are really turning into anxiety symptoms.    Past medical history, Surgical history, Family history not pertinant except as noted below, Social history, Allergies, and medications have been entered into the medical record, reviewed, and corrections made.   Review of Systems: No fevers, chills, night sweats, weight loss, chest pain, or shortness of breath.   Objective:    General: Well Developed, well nourished, and in no acute distress.  Neuro: Alert and oriented x3, extra-ocular muscles intact, sensation grossly intact.  HEENT: Normocephalic, atraumatic  Skin: Warm and dry, no rashes. Cardiac: Regular rate  and rhythm, no murmurs rubs or gallops, no lower extremity edema.  Respiratory: Clear to auscultation bilaterally. Not using accessory muscles, speaking in full sentences.   Impression and Recommendations:    DM -last A1c was uncontrolled but she is doing much better.  We discussed that I do not think the Trulicity alone will keep her A1c at goal so if her ultimate goal is to get off of insulin then I really think we need to add an oral medication.  She is willing to try Januvia as long as it does not have the metformin component.  She is intolerant to metformin and [place]. Lab Results  Component Value Date   HGBA1C 8.0 (A) 01/24/2018    Insomnia -we discussed hygiene for sleep including keeping the bedroom conducive to sleep.  Avoiding screen time, avoiding caffeine, and working on relaxation techniques specifically for sleep.  We also discussed medications as an options and pros and cons.  We discussed a trial of Ambien for as needed use not nightly use.  I did warn about potential for dependency as well as sedation grogginess and sleepwalking.  She will start with half of a 5 mg tab.  And can go up to a whole tab if needed.  30 tabs sent and this should last her a couple months.  Depression/anxiety-PHQ 9 score 13 and gad 7 score of 12.  We restart her citalopram.  New prescription sent to pharmacy.

## 2018-03-24 ENCOUNTER — Telehealth: Payer: Self-pay | Admitting: *Deleted

## 2018-03-24 ENCOUNTER — Encounter: Payer: Self-pay | Admitting: Family Medicine

## 2018-03-24 ENCOUNTER — Telehealth: Payer: Self-pay | Admitting: Family Medicine

## 2018-03-24 ENCOUNTER — Other Ambulatory Visit: Payer: Self-pay | Admitting: *Deleted

## 2018-03-24 MED ORDER — SITAGLIPTIN PHOSPHATE 100 MG PO TABS: 100.0000 mg | ORAL_TABLET | Freq: Every day | ORAL | 3 refills | Status: DC

## 2018-03-24 NOTE — Telephone Encounter (Signed)
I will call the representative and see about samples. Waiting on response.

## 2018-03-24 NOTE — Telephone Encounter (Signed)
Patient is applying for patient assistance through [employer] for her Januvia.  We got the forms today.  Can you call the rep and see if we can get enough samples may be for a couple weeks to see if she ends up qualifying.

## 2018-03-24 NOTE — Telephone Encounter (Signed)
[employer] Patient assistance program [address] [place] [address]   form completed, mailed and scanned into patient's chart.

## 2018-03-27 NOTE — Telephone Encounter (Signed)
At this time no samples are available.

## 2018-04-13 DIAGNOSIS — H2512 Age-related nuclear cataract, left eye: Secondary | ICD-10-CM | POA: Diagnosis not present

## 2018-04-13 DIAGNOSIS — H25812 Combined forms of age-related cataract, left eye: Secondary | ICD-10-CM | POA: Diagnosis not present

## 2018-04-20 DIAGNOSIS — H25811 Combined forms of age-related cataract, right eye: Secondary | ICD-10-CM | POA: Diagnosis not present

## 2018-04-20 DIAGNOSIS — H2511 Age-related nuclear cataract, right eye: Secondary | ICD-10-CM | POA: Diagnosis not present

## 2018-04-20 LAB — HM DIABETES EYE EXAM

## 2018-04-24 ENCOUNTER — Other Ambulatory Visit: Payer: Self-pay | Admitting: *Deleted

## 2018-04-24 MED ORDER — OMEPRAZOLE 20 MG PO CPDR: 20.0000 mg | DELAYED_RELEASE_CAPSULE | Freq: Every day | ORAL | 11 refills | Status: DC

## 2018-05-04 ENCOUNTER — Encounter: Payer: Self-pay | Admitting: Family Medicine

## 2018-05-18 ENCOUNTER — Ambulatory Visit (INDEPENDENT_AMBULATORY_CARE_PROVIDER_SITE_OTHER): Payer: BLUE CROSS/BLUE SHIELD | Admitting: Family Medicine

## 2018-05-18 ENCOUNTER — Encounter: Payer: Self-pay | Admitting: Family Medicine

## 2018-05-18 VITALS — BP 128/79 | HR 81 | Ht 64.0 in | Wt 168.0 lb

## 2018-05-18 DIAGNOSIS — E119 Type 2 diabetes mellitus without complications: Secondary | ICD-10-CM

## 2018-05-18 DIAGNOSIS — Z1239 Encounter for other screening for malignant neoplasm of breast: Secondary | ICD-10-CM | POA: Diagnosis not present

## 2018-05-18 DIAGNOSIS — M069 Rheumatoid arthritis, unspecified: Secondary | ICD-10-CM

## 2018-05-18 DIAGNOSIS — I10 Essential (primary) hypertension: Secondary | ICD-10-CM

## 2018-05-18 DIAGNOSIS — F339 Major depressive disorder, recurrent, unspecified: Secondary | ICD-10-CM

## 2018-05-18 LAB — POCT GLYCOSYLATED HEMOGLOBIN (HGB A1C): HEMOGLOBIN A1C: 6.9 % — AB (ref 4.0–5.6)

## 2018-05-18 MED ORDER — HYDROCHLOROTHIAZIDE 25 MG PO TABS: 25.0000 mg | ORAL_TABLET | Freq: Every day | ORAL | 3 refills | Status: DC

## 2018-05-18 MED ORDER — TELMISARTAN 80 MG PO TABS: 80.0000 mg | ORAL_TABLET | Freq: Every day | ORAL | 5 refills | Status: DC

## 2018-05-18 MED ORDER — CITALOPRAM HYDROBROMIDE 20 MG PO TABS: 20.0000 mg | ORAL_TABLET | Freq: Every day | ORAL | 3 refills | Status: DC

## 2018-05-18 NOTE — Progress Notes (Signed)
Subjective:    CC: DM  HPI:  Diabetes - no hypoglycemic events. No wounds or sores that are not healing well. No increased thirst or urination. Checking glucose at home. Taking medications as prescribed without any side effects. She wasn't able to get Januvia.  Still waiting on approval through the insurance company.  But she has been doing the Trulicity and the insulin.  He is on the insulin though.  Hypertension- Pt denies chest pain, SOB, dizziness, or heart palpitations.  Taking meds as directed w/o problems.  Denies medication side effects.   Follow up insomnia-has been using her Ambien sparingly.  But has been working well when she does use it.  Follow-up depression/anxiety-we decided to restart her citalopram at the last office visit.  She was on the citalopram for almost 20 years but decided to attempt to wean off.  She became very emotional very tearful and we restarted it.  She feels like things are much better balance since being back on it.  Rheumatoid arthritis-she has been on the Orencia for couple of months now.  She has gotten some relief with her shoulder and her hip but her hands are still hurting.  Most day her pain is about 8 out of 10 but she does have a follow-up with her rheumatologist coming up in a few weeks.  Past medical history, Surgical history, Family history not pertinant except as noted below, Social history, Allergies, and medications have been entered into the medical record, reviewed, and corrections made.   Review of Systems: No fevers, chills, night sweats, weight loss, chest pain, or shortness of breath.   Objective:    General: Well Developed, well nourished, and in no acute distress.  Neuro: Alert and oriented x3, extra-ocular muscles intact, sensation grossly intact.  HEENT: Normocephalic, atraumatic  Skin: Warm and dry, no rashes. Cardiac: Regular rate and rhythm, no murmurs rubs or gallops, no lower extremity edema.  Respiratory: Clear to  auscultation bilaterally. Not using accessory muscles, speaking in full sentences.   Impression and Recommendations:    DM -  Much improved. controlled. Continue current regimen.  We will be able to get the Januvia approved in which case we might even be able to decrease her insulin dosing which would be fantastic it and ultimately that would be her goal she really wants to be able to come off of insulin.  Follow up in  3 months.    HTN - Well controlled. Continue current regimen. Follow up in  3 months.    Depression/anxiety-PHQ 9 score of 5 today which is down from previous of 13.  And gad 7 score of 4 today which is down from previous of 12.  Doing much better back on the citalopram.   Rhuematoid arthritis-has follow-up coming up in about a month.  Unfortunately has not been getting some good relief with her hand joint pain with the Orencia.

## 2018-05-19 ENCOUNTER — Encounter: Payer: Self-pay | Admitting: Family Medicine

## 2018-05-19 MED ORDER — TELMISARTAN 80 MG PO TABS: 80.0000 mg | ORAL_TABLET | Freq: Every day | ORAL | 5 refills | Status: DC

## 2018-05-25 ENCOUNTER — Encounter: Payer: Self-pay | Admitting: Family Medicine

## 2018-06-17 ENCOUNTER — Other Ambulatory Visit: Payer: Self-pay | Admitting: Adult Health

## 2018-06-17 MED ORDER — OSELTAMIVIR PHOSPHATE 75 MG PO CAPS: 75.0000 mg | ORAL_CAPSULE | Freq: Every day | ORAL | 0 refills | Status: DC

## 2018-06-17 NOTE — Progress Notes (Signed)
Tamiflu Prophylaxis course sent in due to grandchildren tested positive for Flu A today. She lives with ill grandchildren. She has T2D, RA and reports that she "gets sick very easily" She also states that the children's pediatrician advised that she start Tamiflu. She reports nasal drainage and loose stools, however does not have fever- that is why On -Call Service contacted me Last CMP 01/2018- GFR well above 60 Spoke with pt and she stated that Red Dye Allergy isn't a true allergy and that she takes medications with Red Dye and has never had reactions.

## 2018-06-21 ENCOUNTER — Encounter: Payer: Self-pay | Admitting: Family Medicine

## 2018-06-26 DIAGNOSIS — Z79899 Other long term (current) drug therapy: Secondary | ICD-10-CM | POA: Diagnosis not present

## 2018-06-26 DIAGNOSIS — M0579 Rheumatoid arthritis with rheumatoid factor of multiple sites without organ or systems involvement: Secondary | ICD-10-CM | POA: Diagnosis not present

## 2018-07-19 ENCOUNTER — Encounter: Payer: Self-pay | Admitting: Family Medicine

## 2018-07-19 MED ORDER — ZOLPIDEM TARTRATE 5 MG PO TABS: 2.5000 mg | ORAL_TABLET | Freq: Every evening | ORAL | 0 refills | Status: DC | PRN

## 2018-07-19 MED ORDER — DULAGLUTIDE 1.5 MG/0.5ML ~~LOC~~ SOAJ: 1.5000 mg | SUBCUTANEOUS | 3 refills | Status: DC

## 2018-07-19 MED ORDER — CITALOPRAM HYDROBROMIDE 40 MG PO TABS: 40.0000 mg | ORAL_TABLET | Freq: Every day | ORAL | 1 refills | Status: DC

## 2018-07-19 MED ORDER — INSULIN NPH ISOPHANE & REGULAR (70-30) 100 UNIT/ML ~~LOC~~ SUSP: SUBCUTANEOUS | 3 refills | Status: DC

## 2018-07-19 MED ORDER — ATORVASTATIN CALCIUM 20 MG PO TABS: 20.0000 mg | ORAL_TABLET | Freq: Every day | ORAL | 1 refills | Status: DC

## 2018-07-19 MED ORDER — OMEPRAZOLE 20 MG PO CPDR: 20.0000 mg | DELAYED_RELEASE_CAPSULE | Freq: Every day | ORAL | 1 refills | Status: DC

## 2018-07-19 MED ORDER — HYDROCHLOROTHIAZIDE 25 MG PO TABS: 25.0000 mg | ORAL_TABLET | Freq: Every day | ORAL | 1 refills | Status: DC

## 2018-07-19 MED ORDER — TELMISARTAN 80 MG PO TABS: 80.0000 mg | ORAL_TABLET | Freq: Every day | ORAL | 1 refills | Status: DC

## 2018-07-19 MED ORDER — SITAGLIPTIN PHOSPHATE 100 MG PO TABS: 100.0000 mg | ORAL_TABLET | Freq: Every day | ORAL | 1 refills | Status: DC

## 2018-08-10 ENCOUNTER — Telehealth: Payer: Self-pay | Admitting: Family Medicine

## 2018-08-10 NOTE — Telephone Encounter (Signed)
error 

## 2018-08-21 ENCOUNTER — Ambulatory Visit (INDEPENDENT_AMBULATORY_CARE_PROVIDER_SITE_OTHER): Payer: Medicare Other | Admitting: Family Medicine

## 2018-08-21 ENCOUNTER — Encounter: Payer: Self-pay | Admitting: Family Medicine

## 2018-08-21 VITALS — BP 132/80 | HR 72 | Temp 97.4°F | Resp 18 | Ht 64.0 in | Wt 164.4 lb

## 2018-08-21 DIAGNOSIS — E119 Type 2 diabetes mellitus without complications: Secondary | ICD-10-CM

## 2018-08-21 DIAGNOSIS — I10 Essential (primary) hypertension: Secondary | ICD-10-CM

## 2018-08-21 DIAGNOSIS — F339 Major depressive disorder, recurrent, unspecified: Secondary | ICD-10-CM | POA: Diagnosis not present

## 2018-08-21 DIAGNOSIS — M069 Rheumatoid arthritis, unspecified: Secondary | ICD-10-CM | POA: Diagnosis not present

## 2018-08-21 MED ORDER — INSULIN LISPRO (1 UNIT DIAL) 100 UNIT/ML (KWIKPEN): 5.0000 [IU] | PEN_INJECTOR | Freq: Two times a day (BID) | SUBCUTANEOUS | 1 refills | Status: DC

## 2018-08-21 MED ORDER — INSULIN DETEMIR 100 UNIT/ML FLEXPEN: 12.0000 [IU] | PEN_INJECTOR | Freq: Every day | SUBCUTANEOUS | 1 refills | Status: DC

## 2018-08-21 NOTE — Progress Notes (Signed)
Virtual Visit via Video Note  I connected with Ann Torres on 08/21/18 at  9:10 AM EDT by a video enabled telemedicine application and verified that I am speaking with the correct person using two identifiers.   I discussed the limitations of evaluation and management by telemedicine and the availability of in person appointments. The patient expressed understanding and agreed to proceed.  Subjective:    CC: DM  HPI:  Hypertension- Pt denies chest pain, SOB, dizziness, or heart palpitations.  Taking meds as directed w/o problems.  Denies medication side effects.    Diabetes - no hypoglycemic events. No wounds or sores that are not healing well. No increased thirst or urination. Checking glucose at home. Taking medications as prescribed without any side effects.fasting sugar this AM is 121. After meals are running 130-140s. Has been getting some exercise.   F/U depression - Mood has been OK. Some increased stress.   RA- she recently started on Imuran bc the Orencia wasn't helping her much.    Past medical history, Surgical history, Family history not pertinant except as noted below, Social history, Allergies, and medications have been entered into the medical record, reviewed, and corrections made.   Review of Systems: No fevers, chills, night sweats, weight loss, chest pain, or shortness of breath.   Objective:    General: Speaking clearly in complete sentences without any shortness of breath.  Alert and oriented x3.  Normal judgment. No apparent acute distress.    Impression and Recommendations:    HTN - Well controlled. Continue current regimen. Follow up in  3-4 months.   DM - Well controlled. She will check on insulin coverage with her Medicare right now she is just been paying cash for vials but would prefer to switch to pens if at all possible.  She is been tolerating the Trulicity well.  Continue current regimen. Follow up in 3-4 months.    Depression - Mood has been Ok  but some increased stressed.  She finally got approved for an apartment on her own but will not be able to get the furniture from [place] for several weeks because of COVID.  But she is excited about getting a chance to move into her own place.  RA - Now on Imuran.  Follows with Dr. Allena Katz in a week.  Will get labs then.      I discussed the assessment and treatment plan with the patient. The patient was provided an opportunity to ask questions and all were answered. The patient agreed with the plan and demonstrated an understanding of the instructions.   The patient was advised to call back or seek an in-person evaluation if the symptoms worsen or if the condition fails to improve as anticipated.   Nani Gasser, MD

## 2018-08-24 LAB — HEMOGLOBIN A1C
Est. average glucose Bld gHb Est-mCnc: 146 mg/dL
Hgb A1c MFr Bld: 6.7 % — ABNORMAL HIGH (ref 4.8–5.6)

## 2018-08-24 LAB — CBC WITH DIFFERENTIAL/PLATELET
Basophils Absolute: 0.1 10*3/uL (ref 0.0–0.2)
Basos: 1 %
EOS (ABSOLUTE): 0.1 10*3/uL (ref 0.0–0.4)
Eos: 1 %
Hematocrit: 40.5 % (ref 34.0–46.6)
Hemoglobin: 13.6 g/dL (ref 11.1–15.9)
Immature Grans (Abs): 0.1 10*3/uL (ref 0.0–0.1)
Immature Granulocytes: 1 %
Lymphocytes Absolute: 3.9 10*3/uL — ABNORMAL HIGH (ref 0.7–3.1)
Lymphs: 37 %
MCH: 25.9 pg — ABNORMAL LOW (ref 26.6–33.0)
MCHC: 33.6 g/dL (ref 31.5–35.7)
MCV: 77 fL — ABNORMAL LOW (ref 79–97)
Monocytes Absolute: 0.8 10*3/uL (ref 0.1–0.9)
Monocytes: 7 %
Neutrophils Absolute: 5.6 10*3/uL (ref 1.4–7.0)
Neutrophils: 53 %
Platelets: 282 10*3/uL (ref 150–450)
RBC: 5.25 x10E6/uL (ref 3.77–5.28)
RDW: 13.5 % (ref 11.7–15.4)
WBC: 10.4 10*3/uL (ref 3.4–10.8)

## 2018-08-24 LAB — COMPREHENSIVE METABOLIC PANEL
ALT: 12 IU/L (ref 0–32)
AST: 14 IU/L (ref 0–40)
Albumin/Globulin Ratio: 2.1 (ref 1.2–2.2)
Albumin: 4.6 g/dL (ref 3.8–4.8)
Alkaline Phosphatase: 113 IU/L (ref 39–117)
BUN/Creatinine Ratio: 21 (ref 12–28)
BUN: 14 mg/dL (ref 8–27)
Bilirubin Total: 0.5 mg/dL (ref 0.0–1.2)
CO2: 23 mmol/L (ref 20–29)
Calcium: 9.9 mg/dL (ref 8.7–10.3)
Chloride: 100 mmol/L (ref 96–106)
Creatinine, Ser: 0.66 mg/dL (ref 0.57–1.00)
GFR calc Af Amer: 109 mL/min/{1.73_m2} (ref >59)
GFR calc non Af Amer: 94 mL/min/{1.73_m2} (ref >59)
Globulin, Total: 2.2 g/dL (ref 1.5–4.5)
Glucose: 144 mg/dL — ABNORMAL HIGH (ref 65–99)
Potassium: 3.9 mmol/L (ref 3.5–5.2)
Sodium: 140 mmol/L (ref 134–144)
Total Protein: 6.8 g/dL (ref 6.0–8.5)

## 2018-08-24 LAB — LIPID PANEL
Chol/HDL Ratio: 4 ratio (ref 0.0–4.4)
Cholesterol, Total: 174 mg/dL (ref 100–199)
HDL: 43 mg/dL (ref >39)
LDL Calculated: 95 mg/dL (ref 0–99)
Triglycerides: 182 mg/dL — ABNORMAL HIGH (ref 0–149)
VLDL Cholesterol Cal: 36 mg/dL (ref 5–40)

## 2018-08-24 LAB — VITAMIN B12: Vitamin B-12: 410 pg/mL (ref 232–1245)

## 2018-08-28 MED ORDER — PEN NEEDLES 31G X 6 MM MISC: 3 refills | Status: DC

## 2018-08-28 NOTE — Addendum Note (Signed)
Addended by: Mallie Snooks R on: 08/28/2018 11:18 AM   Modules accepted: Orders

## 2018-09-06 LAB — FERRITIN: Ferritin: 150 ng/mL (ref 15–150)

## 2018-09-06 LAB — SPECIMEN STATUS REPORT

## 2018-10-03 ENCOUNTER — Encounter: Payer: Self-pay | Admitting: Family Medicine

## 2018-10-05 ENCOUNTER — Ambulatory Visit (INDEPENDENT_AMBULATORY_CARE_PROVIDER_SITE_OTHER): Payer: Medicare Other | Admitting: Family Medicine

## 2018-10-05 ENCOUNTER — Encounter: Payer: Self-pay | Admitting: Family Medicine

## 2018-10-05 ENCOUNTER — Telehealth: Payer: Self-pay | Admitting: Family Medicine

## 2018-10-05 VITALS — BP 123/65 | HR 78 | Temp 98.3°F | Ht 64.0 in | Wt 163.0 lb

## 2018-10-05 DIAGNOSIS — R42 Dizziness and giddiness: Secondary | ICD-10-CM | POA: Diagnosis not present

## 2018-10-05 DIAGNOSIS — I10 Essential (primary) hypertension: Secondary | ICD-10-CM | POA: Diagnosis not present

## 2018-10-05 DIAGNOSIS — F439 Reaction to severe stress, unspecified: Secondary | ICD-10-CM

## 2018-10-05 DIAGNOSIS — R51 Headache: Secondary | ICD-10-CM | POA: Diagnosis not present

## 2018-10-05 DIAGNOSIS — R519 Headache, unspecified: Secondary | ICD-10-CM

## 2018-10-05 DIAGNOSIS — R112 Nausea with vomiting, unspecified: Secondary | ICD-10-CM | POA: Diagnosis not present

## 2018-10-05 DIAGNOSIS — R10826 Epigastric rebound abdominal tenderness: Secondary | ICD-10-CM

## 2018-10-05 NOTE — Telephone Encounter (Signed)
Please call patient and let her know that upon further thought I would like to get some additional labs even though she just had some done in April.  I would like to recheck her CBC to look for infection even though she has not run a fever.  And I would like to check a lipase just to make sure that her pancreas is okay especially with the vomiting.
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
## 2018-10-05 NOTE — Patient Instructions (Signed)
Cut your hctz in half and take a half a tab daily for the next week. If BPS start to go back up then ok to go up to whole tab if needed.

## 2018-10-05 NOTE — Telephone Encounter (Signed)
Pt advised. She will go tomorrow.Heath Gold, CMA

## 2018-10-05 NOTE — Addendum Note (Signed)
Addended by: Nani Gasser D on: 10/05/2018 10:30 AM   Modules accepted: Orders

## 2018-10-05 NOTE — Progress Notes (Signed)
She reports that her sxs started, dizziness.Thursday when she did her weekly Trulicity injection by Saturday she felt that she was getting some better.  Pt is taking 15 U of the Levemir at bedtime she wanted to discuss this w/Dr. Linford Arnold. Laureen Ochs, Viann Shove, CMA

## 2018-10-09 ENCOUNTER — Encounter: Payer: Self-pay | Admitting: Family Medicine

## 2018-10-09 LAB — COMPLETE METABOLIC PANEL WITH GFR
AG Ratio: 2 (calc) (ref 1.0–2.5)
ALT: 11 U/L (ref 6–29)
AST: 13 U/L (ref 10–35)
Albumin: 4.1 g/dL (ref 3.6–5.1)
Alkaline phosphatase (APISO): 99 U/L (ref 37–153)
BUN: 11 mg/dL (ref 7–25)
CO2: 28 mmol/L (ref 20–32)
Calcium: 9.1 mg/dL (ref 8.6–10.4)
Chloride: 103 mmol/L (ref 98–110)
Creat: 0.66 mg/dL (ref 0.50–0.99)
GFR, Est African American: 109 mL/min/{1.73_m2} (ref >=60)
GFR, Est Non African American: 94 mL/min/{1.73_m2} (ref >=60)
Globulin: 2.1 g/dL (calc) (ref 1.9–3.7)
Glucose, Bld: 135 mg/dL — ABNORMAL HIGH (ref 65–99)
Potassium: 3.6 mmol/L (ref 3.5–5.3)
Sodium: 139 mmol/L (ref 135–146)
Total Bilirubin: 0.6 mg/dL (ref 0.2–1.2)
Total Protein: 6.2 g/dL (ref 6.1–8.1)

## 2018-10-09 LAB — CBC WITH DIFFERENTIAL/PLATELET
Absolute Monocytes: 426 cells/uL (ref 200–950)
Basophils Absolute: 53 cells/uL (ref 0–200)
Basophils Relative: 0.7 %
Eosinophils Absolute: 213 cells/uL (ref 15–500)
Eosinophils Relative: 2.8 %
HCT: 38.3 % (ref 35.0–45.0)
Hemoglobin: 12.8 g/dL (ref 11.7–15.5)
Lymphs Abs: 2926 cells/uL (ref 850–3900)
MCH: 25.9 pg — ABNORMAL LOW (ref 27.0–33.0)
MCHC: 33.4 g/dL (ref 32.0–36.0)
MCV: 77.5 fL — ABNORMAL LOW (ref 80.0–100.0)
MPV: 10.1 fL (ref 7.5–12.5)
Monocytes Relative: 5.6 %
Neutro Abs: 3982 cells/uL (ref 1500–7800)
Neutrophils Relative %: 52.4 %
Platelets: 260 10*3/uL (ref 140–400)
RBC: 4.94 10*6/uL (ref 3.80–5.10)
RDW: 13.7 % (ref 11.0–15.0)
Total Lymphocyte: 38.5 %
WBC: 7.6 10*3/uL (ref 3.8–10.8)

## 2018-10-09 LAB — IRON: Iron: 76 ug/dL (ref 45–160)

## 2018-10-09 LAB — LIPASE: Lipase: 28 U/L (ref 7–60)

## 2018-10-19 ENCOUNTER — Other Ambulatory Visit: Payer: Self-pay | Admitting: Family Medicine

## 2018-12-27 ENCOUNTER — Telehealth: Payer: Self-pay | Admitting: Family Medicine

## 2018-12-27 NOTE — Telephone Encounter (Signed)
Please call patient and remind her to please go for her mammogram.  The order was placed in January so all she has to do is call downstairs to get an appointment.  If she has a different location that she would like to be seen at the not perfectly fine as well.

## 2018-12-28 ENCOUNTER — Encounter: Payer: Self-pay | Admitting: Family Medicine

## 2018-12-28 ENCOUNTER — Other Ambulatory Visit: Payer: Self-pay | Admitting: Family Medicine

## 2018-12-28 NOTE — Telephone Encounter (Signed)
Pt advised, she is contacting imaging to schedule.

## 2018-12-28 NOTE — Telephone Encounter (Signed)
Needs appointment

## 2019-01-01 MED ORDER — INSULIN LISPRO PROT & LISPRO (75-25 MIX) 100 UNIT/ML KWIKPEN: 10.0000 [IU] | PEN_INJECTOR | Freq: Two times a day (BID) | SUBCUTANEOUS | 1 refills | Status: DC

## 2019-01-01 MED ORDER — INSULIN LISPRO PROT & LISPRO (75-25 MIX) 100 UNIT/ML KWIKPEN: 10.0000 [IU] | PEN_INJECTOR | Freq: Two times a day (BID) | SUBCUTANEOUS | 3 refills | Status: DC

## 2019-01-01 NOTE — Addendum Note (Signed)
Addended by: Beatrice Lecher D on: 01/01/2019 04:32 PM   Modules accepted: Orders

## 2019-01-01 NOTE — Telephone Encounter (Signed)
This was evidently sent to [facility].  I resent it to [facility].  Please call [facility] to cancel the prescription.

## 2019-01-02 NOTE — Telephone Encounter (Signed)
rx cancelled at [facility]  

## 2019-01-04 ENCOUNTER — Encounter: Payer: Self-pay | Admitting: Family Medicine

## 2019-01-05 MED ORDER — TRULICITY 1.5 MG/0.5ML ~~LOC~~ SOAJ: SUBCUTANEOUS | 3 refills | Status: DC

## 2019-01-15 ENCOUNTER — Ambulatory Visit (INDEPENDENT_AMBULATORY_CARE_PROVIDER_SITE_OTHER): Payer: Medicare Other | Admitting: Family Medicine

## 2019-01-15 ENCOUNTER — Other Ambulatory Visit: Payer: Self-pay

## 2019-01-15 ENCOUNTER — Encounter: Payer: Self-pay | Admitting: Family Medicine

## 2019-01-15 DIAGNOSIS — Z1211 Encounter for screening for malignant neoplasm of colon: Secondary | ICD-10-CM

## 2019-01-15 DIAGNOSIS — Z23 Encounter for immunization: Secondary | ICD-10-CM | POA: Diagnosis not present

## 2019-01-15 DIAGNOSIS — K6289 Other specified diseases of anus and rectum: Secondary | ICD-10-CM

## 2019-01-30 ENCOUNTER — Encounter: Payer: Self-pay | Admitting: Family Medicine

## 2019-02-01 ENCOUNTER — Encounter: Payer: Self-pay | Admitting: Family Medicine

## 2019-02-01 ENCOUNTER — Ambulatory Visit (INDEPENDENT_AMBULATORY_CARE_PROVIDER_SITE_OTHER): Payer: Medicare Other

## 2019-02-01 ENCOUNTER — Other Ambulatory Visit: Payer: Self-pay

## 2019-02-01 ENCOUNTER — Ambulatory Visit (INDEPENDENT_AMBULATORY_CARE_PROVIDER_SITE_OTHER): Payer: Medicare Other | Admitting: Family Medicine

## 2019-02-01 VITALS — BP 139/80 | HR 84 | Ht 64.0 in | Wt 163.0 lb

## 2019-02-01 DIAGNOSIS — Z1239 Encounter for other screening for malignant neoplasm of breast: Secondary | ICD-10-CM

## 2019-02-01 DIAGNOSIS — E119 Type 2 diabetes mellitus without complications: Secondary | ICD-10-CM | POA: Diagnosis not present

## 2019-02-01 DIAGNOSIS — R718 Other abnormality of red blood cells: Secondary | ICD-10-CM | POA: Diagnosis not present

## 2019-02-01 LAB — POCT GLYCOSYLATED HEMOGLOBIN (HGB A1C): Hemoglobin A1C: 6.6 % — AB (ref 4.0–5.6)

## 2019-02-01 MED ORDER — INSULIN LISPRO PROT & LISPRO (75-25 MIX) 100 UNIT/ML KWIKPEN: 14.0000 [IU] | PEN_INJECTOR | Freq: Two times a day (BID) | SUBCUTANEOUS | 2 refills | Status: DC

## 2019-02-01 NOTE — Patient Instructions (Signed)
Okay to increase the Humalog to 14 units in the morning and continue 12 units before your evening meal.  Try that for 3 consecutive days.  If you are still running high before your evening meal and first thing in the morning then go ahead and increase your evening dose to 14 units as well and then when she has been on that regimen for 3 to 4 days send me a MyChart note and let me know how you are doing.

## 2019-02-01 NOTE — Progress Notes (Signed)
Established Patient Office Visit  Subjective:  Patient ID: Ann Torres, female    DOB: 1955-12-10  Age: 63 y.o. MRN: [id number]  CC:  Chief Complaint  Patient presents with  . Diabetes    HPI Ann Torres presents for uncontrolled sugars x 2 weeks.  Fasting blood sugars in the morning are have jumped to 140-150s.  And 2 hour after meals (and taking Humalog before I eat, which I went back up to taking the 10 units) have been in the 160-170s.  she has been feeling dizzy and tired.  She is actually now up to 12 units.  She usually eats peanut butter with toast and milk for breakfast and then after lunch today for example she had a small English muffin that she had made into a pizza.  2 hours post meal her blood sugar was 160.  And then oftentimes before her evening meal her sugar is running in the 200s.  Patient uses protein drinks as a meal replacement.  No longer living with her daughter and son-in-law and so has been eating a little bit differently but does not feel like it is been dramatically different over the last couple weeks which is when she started to notice the shift in her blood sugars.  Particularly over the last week. She has been eating more sandwiches since has moved out of her daughters house.   Last time she was here she also did blood work in the summer and her MCV was low and it has been low over the last year.  Even though her hemoglobin is been in the normal range at 12.8.  We actually added a iron level this last time it was normal so no great explanation for the low MCV.  She is never been evaluated for thalassemia.  No family history that she is aware of.  She did go for her mammogram earlier today.  History reviewed. No pertinent past medical history.  Past Surgical History:  Procedure Laterality Date  . ABDOMINAL HYSTERECTOMY    . BREAST BIOPSY     in pts 30s  . BREAST CYST EXCISION    . CESAREAN SECTION     x 3  . CHOLECYSTECTOMY    . TUBAL LIGATION       Family History  Problem Relation Age of Onset  . Colon cancer Mother   . Heart attack Father 53    Social History   Socioeconomic History  . Marital status: Divorced    Spouse name: Not on file  . Number of children: Not on file  . Years of education: 2 yr college  . Highest education level: Not on file  Occupational History  . Occupation: retired  Engineer, production  . Financial resource strain: Not on file  . Food insecurity    Worry: Not on file    Inability: Not on file  . Transportation needs    Medical: Not on file    Non-medical: Not on file  Tobacco Use  . Smoking status: Former Smoker    Types: Cigarettes    Quit date: 08/01/2017    Years since quitting: 1.5  . Smokeless tobacco: Never Used  Substance and Sexual Activity  . Alcohol use: No  . Drug use: No  . Sexual activity: Never  Lifestyle  . Physical activity    Days per week: Not on file    Minutes per session: Not on file  . Stress: Not on file  Relationships  .  Social Musician on phone: Not on file    Gets together: Not on file    Attends religious service: Not on file    Active member of club or organization: Not on file    Attends meetings of clubs or organizations: Not on file    Relationship status: Not on file  . Intimate partner violence    Fear of current or ex partner: Not on file    Emotionally abused: Not on file    Physically abused: Not on file    Forced sexual activity: Not on file  Other Topics Concern  . Not on file  Social History Narrative   Looks for 15 minutes daily. Denies significant caffeine intake. Currently divorced and not sexually active. Currently living with her daughter and daughter's family.    Outpatient Medications Prior to Visit  Medication Sig Dispense Refill  . Abatacept (ORENCIA CLICKJECT) 125 MG/ML SOAJ Inject into the skin.    Marland Kitchen atorvastatin (LIPITOR) 20 MG tablet TAKE 1 TABLET AT BEDTIME 90 tablet 0  . citalopram (CELEXA) 40 MG tablet TAKE 1  TABLET DAILY 90 tablet 0  . Dulaglutide (TRULICITY) 1.5 MG/0.5ML SOPN INJECT 1.5 MG UNDER THE SKIN EVERY 7 DAYS. Send 90 day Rx 6 mL 3  . Insulin Pen Needle (EASY TOUCH PEN NEEDLES) 31G X 6 MM MISC USE TO INJECT LEVEMIR AND HUMALOG DAILY 100 each 0  . omeprazole (PRILOSEC) 20 MG capsule TAKE 1 CAPSULE DAILY 90 capsule 0  . telmisartan (MICARDIS) 80 MG tablet Take 1 tablet (80 mg total) by mouth daily. Needs appointment 90 tablet 0  . zolpidem (AMBIEN) 5 MG tablet Take 0.5-1 tablets (2.5-5 mg total) by mouth at bedtime as needed for sleep. 90 tablet 0  . Insulin Lispro Prot & Lispro (HUMALOG MIX 75/25 KWIKPEN) (75-25) 100 UNIT/ML Kwikpen Inject 10 Units into the skin 2 (two) times daily. Before breakfast and supper. 12 mL 1   No facility-administered medications prior to visit.     Allergies  Allergen Reactions  . Lisinopril Shortness Of Breath  . Azathioprine Nausea And Vomiting  . Marcelline Deist [Dapagliflozin] Other (See Comments)    Recurrent yeast infections  . Metformin And Related Other (See Comments)    Diarrhea and vomiting  . Red Dye Other (See Comments)  . Hydroxychloroquine Rash  . Methotrexate Derivatives Rash  . Tape Rash    r    ROS Review of Systems    Objective:    Physical Exam  BP 139/80   Pulse 84   Ht 5\' 4"  (1.626 m)   Wt 163 lb (73.9 kg)   SpO2 99%   BMI 27.98 kg/m  Wt Readings from Last 3 Encounters:  02/01/19 163 lb (73.9 kg)  10/05/18 163 lb (73.9 kg)  08/21/18 164 lb 6.4 oz (74.6 kg)     There are no preventive care reminders to display for this patient.  There are no preventive care reminders to display for this patient.  No results found for: TSH Lab Results  Component Value Date   WBC 7.6 10/06/2018   HGB 12.8 10/06/2018   HCT 38.3 10/06/2018   MCV 77.5 (L) 10/06/2018   PLT 260 10/06/2018   Lab Results  Component Value Date   NA 139 10/06/2018   K 3.6 10/06/2018   CO2 28 10/06/2018   GLUCOSE 135 (H) 10/06/2018   BUN 11 10/06/2018    CREATININE 0.66 10/06/2018   BILITOT 0.6 10/06/2018   ALKPHOS  113 08/23/2018   AST 13 10/06/2018   ALT 11 10/06/2018   PROT 6.2 10/06/2018   ALBUMIN 4.6 08/23/2018   CALCIUM 9.1 10/06/2018   Lab Results  Component Value Date   CHOL 174 08/23/2018   Lab Results  Component Value Date   HDL 43 08/23/2018   Lab Results  Component Value Date   LDLCALC 95 08/23/2018   Lab Results  Component Value Date   TRIG 182 (H) 08/23/2018   Lab Results  Component Value Date   CHOLHDL 4.0 08/23/2018   Lab Results  Component Value Date   HGBA1C 6.6 (A) 02/01/2019      Assessment & Plan:   Problem List Items Addressed This Visit      Endocrine   Diabetes mellitus without complication ([place]) - Primary    Okay to increase the Humalog to 14 units in the morning and continue 12 units before your evening meal.  Try that for 3 consecutive days.  If you are still running high before your evening meal and first thing in the morning then go ahead and increase your evening dose to 14 units as well and then when she has been on that regimen for 3 to 4 days send me a MyChart note and let me know how you are doing.  For now we will stick with current regimen but we can always switch back to her previous regimen which I believe with Levemir and short acting.  We did discuss some strategies around healthy eating and probably doing a little bit more cooking and then saving the food to eat leftover meals.       Relevant Medications   Insulin Lispro Prot & Lispro (HUMALOG MIX 75/25 KWIKPEN) (75-25) 100 UNIT/ML Kwikpen   Other Relevant Orders   POCT glycosylated hemoglobin (Hb A1C) (Completed)   Thalassemia and Hemoglobinopathy Comprehensive Evaluation    Other Visit Diagnoses    Low mean corpuscular volume (MCV)       Relevant Orders   Thalassemia and Hemoglobinopathy Comprehensive Evaluation      Meds ordered this encounter  Medications  . Insulin Lispro Prot & Lispro (HUMALOG MIX 75/25  KWIKPEN) (75-25) 100 UNIT/ML Kwikpen    Sig: Inject 14-20 Units into the skin 2 (two) times daily. Before breakfast and supper.    Dispense:  15 mL    Refill:  2    Follow-up: Return in about 3 months (around 05/04/2019) for Diabetes follow-up.    Beatrice Lecher, MD

## 2019-02-01 NOTE — Assessment & Plan Note (Addendum)
Okay to increase the Humalog to 14 units in the morning and continue 12 units before your evening meal.  Try that for 3 consecutive days.  If you are still running high before your evening meal and first thing in the morning then go ahead and increase your evening dose to 14 units as well and then when she has been on that regimen for 3 to 4 days send me a MyChart note and let me know how you are doing.  For now we will stick with current regimen but we can always switch back to her previous regimen which I believe with Levemir and short acting.  We did discuss some strategies around healthy eating and probably doing a little bit more cooking and then saving the food to eat leftover meals.

## 2019-02-07 ENCOUNTER — Encounter: Payer: Self-pay | Admitting: Family Medicine

## 2019-02-08 ENCOUNTER — Other Ambulatory Visit: Payer: Self-pay | Admitting: Family Medicine

## 2019-03-13 ENCOUNTER — Other Ambulatory Visit: Payer: Self-pay | Admitting: Family Medicine

## 2019-03-26 ENCOUNTER — Encounter: Payer: Self-pay | Admitting: Family Medicine

## 2019-03-26 MED ORDER — INSULIN LISPRO PROT & LISPRO (75-25 MIX) 100 UNIT/ML KWIKPEN: 15.0000 [IU] | PEN_INJECTOR | Freq: Two times a day (BID) | SUBCUTANEOUS | 2 refills | Status: DC

## 2019-03-28 ENCOUNTER — Other Ambulatory Visit: Payer: Self-pay | Admitting: Family Medicine

## 2019-04-16 LAB — THALASSEMIA AND HEMOGLOBINOPATHY COMPREHENSIVE EVALUATION
Ferritin: 75 ng/mL (ref 16–288)
Fetal Hemoglobin Testing: 0.2 % (ref <2.0)
HCT: 40.4 % (ref 35.0–45.0)
Hemoglobin A2 - HGBRFX: 2.5 % (ref 1.8–3.5)
Hemoglobin: 13.4 g/dL (ref 11.7–15.5)
Hgb A: 97.3 % (ref >96.0)
MCH: 26.1 pg — ABNORMAL LOW (ref 27.0–33.0)
MCHC: 33.2 g/dL (ref 32.0–36.0)
MCV: 78.6 FL — ABNORMAL LOW (ref 80.0–100.0)
RBC: 5.14 10*6/uL — ABNORMAL HIGH (ref 3.80–5.10)
RDW: 15.2 % — ABNORMAL HIGH (ref 11.0–15.0)

## 2019-04-16 LAB — GENO PHENO REVIEW

## 2019-04-16 LAB — ALPHA THAL MUT ANALYSIS

## 2019-04-28 ENCOUNTER — Telehealth: Payer: Self-pay | Admitting: Sports Medicine

## 2019-04-28 DIAGNOSIS — K047 Periapical abscess without sinus: Secondary | ICD-10-CM | POA: Insufficient documentation

## 2019-04-28 MED ORDER — AMOXICILLIN-POT CLAVULANATE 875-125 MG PO TABS: 1.0000 | ORAL_TABLET | Freq: Two times a day (BID) | ORAL | 0 refills | Status: AC

## 2019-04-28 NOTE — Telephone Encounter (Signed)
Ann Torres calls in with what she feels is an abscessed right third maxillary molar.  She, she called her dentist, not surprisingly they are not in the office for several more days and for some reason were not comfortable calling in an antibiotic for her.  She has no allergies to penicillin, so we will use Augmentin twice a day for 10 days, analgesics offered but she prefers to simply use topical lidocaine that she already has at home.  No fevers, chills, no diplopia, able to take oral fluids and food.

## 2019-04-28 NOTE — Assessment & Plan Note (Signed)
Ann Torres calls in with what she feels is an abscessed right third maxillary molar.  She, she called her dentist, not surprisingly they are not in the office for several more days and for some reason were not comfortable calling in an antibiotic for her.  She has no allergies to penicillin, so we will use Augmentin twice a day for 10 days, analgesics offered but she prefers to simply use topical lidocaine that she already has at home.  No fevers, chills, no diplopia, able to take oral fluids and food. 

## 2019-05-08 ENCOUNTER — Other Ambulatory Visit: Payer: Self-pay | Admitting: Family Medicine

## 2019-05-08 MED ORDER — INSULIN LISPRO PROT & LISPRO (75-25 MIX) 100 UNIT/ML KWIKPEN: PEN_INJECTOR | SUBCUTANEOUS | 0 refills | Status: DC

## 2019-05-08 MED ORDER — EASY TOUCH PEN NEEDLES 31G X 6 MM MISC: 10 refills | Status: DC

## 2019-05-08 MED ORDER — OMEPRAZOLE 20 MG PO CPDR: 20.0000 mg | DELAYED_RELEASE_CAPSULE | Freq: Every day | ORAL | 0 refills | Status: DC

## 2019-05-08 MED ORDER — TRULICITY 1.5 MG/0.5ML ~~LOC~~ SOAJ: SUBCUTANEOUS | 0 refills | Status: DC

## 2019-05-08 MED ORDER — ATORVASTATIN CALCIUM 20 MG PO TABS: 20.0000 mg | ORAL_TABLET | Freq: Every day | ORAL | 0 refills | Status: DC

## 2019-05-08 MED ORDER — CITALOPRAM HYDROBROMIDE 40 MG PO TABS: 40.0000 mg | ORAL_TABLET | Freq: Every day | ORAL | 0 refills | Status: DC

## 2019-05-08 MED ORDER — TELMISARTAN 80 MG PO TABS: 80.0000 mg | ORAL_TABLET | Freq: Every day | ORAL | 0 refills | Status: DC

## 2019-05-08 NOTE — Telephone Encounter (Signed)
Ann Torres from [facility] called to let the office know that the patient was changing pharmacies. I have sent in her maintenance medications.

## 2019-05-18 ENCOUNTER — Telehealth: Payer: Self-pay

## 2019-05-18 LAB — HM COLONOSCOPY

## 2019-05-18 NOTE — Telephone Encounter (Signed)
Ann Torres called and left a message stating she has been exposed to COVID-19 by her son-in-law. She was wanting to get tested. I tried to call patient for more information, no answer and then the call disconnected.

## 2019-08-07 ENCOUNTER — Other Ambulatory Visit: Payer: Self-pay

## 2019-08-07 ENCOUNTER — Encounter: Payer: Self-pay | Admitting: Family Medicine

## 2019-08-07 ENCOUNTER — Ambulatory Visit (INDEPENDENT_AMBULATORY_CARE_PROVIDER_SITE_OTHER): Payer: Medicare Other | Admitting: Family Medicine

## 2019-08-07 VITALS — BP 129/76 | HR 84 | Ht 64.0 in | Wt 168.0 lb

## 2019-08-07 DIAGNOSIS — M545 Low back pain, unspecified: Secondary | ICD-10-CM

## 2019-08-07 DIAGNOSIS — M6530 Trigger finger, unspecified finger: Secondary | ICD-10-CM | POA: Diagnosis not present

## 2019-08-07 DIAGNOSIS — E119 Type 2 diabetes mellitus without complications: Secondary | ICD-10-CM

## 2019-08-07 LAB — POCT GLYCOSYLATED HEMOGLOBIN (HGB A1C): Hemoglobin A1C: 6.6 % — AB (ref 4.0–5.6)

## 2019-08-07 MED ORDER — PREDNISONE 20 MG PO TABS: 40.0000 mg | ORAL_TABLET | Freq: Every day | ORAL | 0 refills | Status: DC

## 2019-08-07 MED ORDER — TIZANIDINE HCL 4 MG PO TABS: 4.0000 mg | ORAL_TABLET | Freq: Three times a day (TID) | ORAL | 0 refills | Status: DC | PRN

## 2019-08-07 MED ORDER — KETOROLAC TROMETHAMINE 60 MG/2ML IM SOLN
60.0000 mg | Freq: Once | INTRAMUSCULAR | Status: AC
  Administered 2019-08-07: 60 mg via INTRAMUSCULAR

## 2019-08-07 NOTE — Progress Notes (Signed)
Acute Office Visit  Subjective:    Patient ID: Ann Torres, female    DOB: [birth year], 64 y.o.   MRN: [id number]  No chief complaint on file.   HPI Patient is in today for Back Pain.  She has a history of rheumatoid arthritis.  She says the pain started last Thursday she does not member any specific trauma or injury.  She denies any recent heavy lifting it just ordered gradually hurting over her low mid back.  She says that she goes to stand up it feels a little bit worse on the left.  No radiation down into the legs.  Though she years ago she did have sciatica and actually saw chiropractor.  She reports her pain has been 15 out of 10 until today.  Today it has been a little bit better at an 8 out of 10.  She is tried heating and icing alternating she is tried Tylenol and ibuprofen alternating she says he finally just backed off on the Tylenol because she was worried she was actually taking too much.   Is also been experiencing trigger finger particularly on the left hand on the fourth finger and on the third and fourth fingers on the right hand she said the ones on the right hand have been actually been going on for about a year the 1 on the left hand has been more recent.  No past medical history on file.  Past Surgical History:  Procedure Laterality Date  . ABDOMINAL HYSTERECTOMY    . BREAST BIOPSY     in pts 30s  . BREAST CYST EXCISION    . CESAREAN SECTION     x 3  . CHOLECYSTECTOMY    . TUBAL LIGATION      Family History  Problem Relation Age of Onset  . Colon cancer Mother   . Heart attack Father 80    Social History   Socioeconomic History  . Marital status: Divorced    Spouse name: Not on file  . Number of children: Not on file  . Years of education: 2 yr college  . Highest education level: Not on file  Occupational History  . Occupation: retired  Tobacco Use  . Smoking status: Former Smoker    Types: Cigarettes    Quit date: 08/01/2017    Years since  quitting: 2.0  . Smokeless tobacco: Never Used  Substance and Sexual Activity  . Alcohol use: No  . Drug use: No  . Sexual activity: Never  Other Topics Concern  . Not on file  Social History Narrative   Looks for 15 minutes daily. Denies significant caffeine intake. Currently divorced and not sexually active. Currently living with her daughter and daughter's family.   Social Determinants of Health   Financial Resource Strain:   . Difficulty of Paying Living Expenses:   Food Insecurity:   . Worried About Programme researcher, broadcasting/film/video in the Last Year:   . Barista in the Last Year:   Transportation Needs:   . Freight forwarder (Medical):   Marland Kitchen Lack of Transportation (Non-Medical):   Physical Activity:   . Days of Exercise per Week:   . Minutes of Exercise per Session:   Stress:   . Feeling of Stress :   Social Connections:   . Frequency of Communication with Friends and Family:   . Frequency of Social Gatherings with Friends and Family:   . Attends Religious Services:   . Active Member  of Clubs or Organizations:   . Attends Archivist Meetings:   Marland Kitchen Marital Status:   Intimate Partner Violence:   . Fear of Current or Ex-Partner:   . Emotionally Abused:   Marland Kitchen Physically Abused:   . Sexually Abused:     Outpatient Medications Prior to Visit  Medication Sig Dispense Refill  . Abatacept (ORENCIA CLICKJECT) 144 MG/ML SOAJ Inject into the skin.    Marland Kitchen atorvastatin (LIPITOR) 20 MG tablet Take 1 tablet (20 mg total) by mouth at bedtime. 90 tablet 0  . citalopram (CELEXA) 40 MG tablet Take 1 tablet (40 mg total) by mouth daily. 90 tablet 0  . Dulaglutide (TRULICITY) 1.5 RX/5.4MG SOPN INJECT 1.5 MG UNDER THE SKIN EVERY 7 DAYS. Send 90 day Rx 6 mL 0  . Insulin Lispro Prot & Lispro (HUMALOG MIX 75/25 KWIKPEN) (75-25) 100 UNIT/ML Kwikpen Inject 15 units under skin twice a day, before breakfast and supper 15 mL 0  . Insulin Pen Needle (EASY TOUCH PEN NEEDLES) 31G X 6 MM MISC Use  to inject insulin. 100 each 10  . omeprazole (PRILOSEC) 20 MG capsule Take 1 capsule (20 mg total) by mouth daily. 90 capsule 0  . telmisartan (MICARDIS) 80 MG tablet Take 1 tablet (80 mg total) by mouth daily. 90 tablet 0  . zolpidem (AMBIEN) 5 MG tablet Take 0.5-1 tablets (2.5-5 mg total) by mouth at bedtime as needed for sleep. 90 tablet 0   No facility-administered medications prior to visit.    Allergies  Allergen Reactions  . Lisinopril Shortness Of Breath  . Azathioprine Nausea And Vomiting  . Wilder Glade [Dapagliflozin] Other (See Comments)    Recurrent yeast infections  . Metformin And Related Other (See Comments)    Diarrhea and vomiting  . Red Dye Other (See Comments)  . Hydroxychloroquine Rash  . Methotrexate Derivatives Rash  . Tape Rash    r    Review of Systems     Objective:    Physical Exam Constitutional:      Appearance: She is well-developed.  HENT:     Head: Normocephalic and atraumatic.  Cardiovascular:     Rate and Rhythm: Normal rate and regular rhythm.     Heart sounds: Normal heart sounds.  Pulmonary:     Effort: Pulmonary effort is normal.     Breath sounds: Normal breath sounds.  Musculoskeletal:     Comments: Tender over mid low back with tenderness over both SI joints.  Negative straight leg raise bilaterally.  Hip, knee, ankle strength is 5-5 bilaterally.  Patellar reflexes 1+ bilaterally  Skin:    General: Skin is warm and dry.  Neurological:     Mental Status: She is alert and oriented to person, place, and time.     Comments: Patellar reflexes 1+ bilaterally.    Psychiatric:        Behavior: Behavior normal.     BP 129/76   Pulse 84   Ht 5\' 4"  (1.626 m)   Wt 168 lb (76.2 kg)   SpO2 96%   BMI 28.84 kg/m  Wt Readings from Last 3 Encounters:  08/07/19 168 lb (76.2 kg)  02/01/19 163 lb (73.9 kg)  10/05/18 163 lb (73.9 kg)    Health Maintenance Due  Topic Date Due  . OPHTHALMOLOGY EXAM  04/21/2019    There are no preventive  care reminders to display for this patient.   No results found for: TSH Lab Results  Component Value Date   WBC  7.6 10/06/2018   HGB 13.4 03/14/2019   HCT 40.4 03/14/2019   MCV 78.6 (L) 03/14/2019   PLT 260 10/06/2018   Lab Results  Component Value Date   NA 139 10/06/2018   K 3.6 10/06/2018   CO2 28 10/06/2018   GLUCOSE 135 (H) 10/06/2018   BUN 11 10/06/2018   CREATININE 0.66 10/06/2018   BILITOT 0.6 10/06/2018   ALKPHOS 113 08/23/2018   AST 13 10/06/2018   ALT 11 10/06/2018   PROT 6.2 10/06/2018   ALBUMIN 4.6 08/23/2018   CALCIUM 9.1 10/06/2018   Lab Results  Component Value Date   CHOL 174 08/23/2018   Lab Results  Component Value Date   HDL 43 08/23/2018   Lab Results  Component Value Date   LDLCALC 95 08/23/2018   Lab Results  Component Value Date   TRIG 182 (H) 08/23/2018   Lab Results  Component Value Date   CHOLHDL 4.0 08/23/2018   Lab Results  Component Value Date   HGBA1C 6.6 (A) 08/07/2019       Assessment & Plan:   Problem List Items Addressed This Visit      Endocrine   Diabetes mellitus without complication (HCC)   Relevant Orders   POCT glycosylated hemoglobin (Hb A1C) (Completed)    Other Visit Diagnoses    Acute midline low back pain without sciatica    -  Primary   Relevant Medications   predniSONE (DELTASONE) 20 MG tablet   tiZANidine (ZANAFLEX) 4 MG tablet   ketorolac (TORADOL) injection 60 mg (Completed)   Trigger finger, unspecified finger, unspecified laterality         Trigger fingers-recommend that she make an appoint with Dr. Benjamin Stain for more definitive treatment for injection.  Midline low back pain-unclear etiology.  Though it does sound like she is had a prior issue with her back years ago and actually had sciatica she is not having any radicular symptoms today.  We will treat with a muscle relaxer as well as Toradol injection given stretches to do on her own at home if she is not improving over the next few  weeks then please let me know.  Did give her some prednisone to fill in the next couple days if she is not starting to feel better.  Also offered more formal physical therapy today but she declined.  She could also consider chiropractic care.  If pain persist then will get x-rays and further work-up.  Meds ordered this encounter  Medications  . predniSONE (DELTASONE) 20 MG tablet    Sig: Take 2 tablets (40 mg total) by mouth daily with breakfast.    Dispense:  10 tablet    Refill:  0  . tiZANidine (ZANAFLEX) 4 MG tablet    Sig: Take 1 tablet (4 mg total) by mouth every 8 (eight) hours as needed for muscle spasms.    Dispense:  30 tablet    Refill:  0  . ketorolac (TORADOL) injection 60 mg     Nani Gasser, MD

## 2019-08-07 NOTE — Patient Instructions (Signed)
Call if improving over the next week.

## 2019-08-31 ENCOUNTER — Other Ambulatory Visit: Payer: Self-pay | Admitting: Family Medicine

## 2019-09-17 ENCOUNTER — Other Ambulatory Visit: Payer: Self-pay | Admitting: Family Medicine

## 2019-10-18 ENCOUNTER — Other Ambulatory Visit: Payer: Self-pay | Admitting: Family Medicine

## 2019-10-23 DIAGNOSIS — Z79899 Other long term (current) drug therapy: Secondary | ICD-10-CM | POA: Diagnosis not present

## 2019-10-23 DIAGNOSIS — M19042 Primary osteoarthritis, left hand: Secondary | ICD-10-CM | POA: Diagnosis not present

## 2019-10-23 DIAGNOSIS — M0579 Rheumatoid arthritis with rheumatoid factor of multiple sites without organ or systems involvement: Secondary | ICD-10-CM | POA: Diagnosis not present

## 2019-10-23 DIAGNOSIS — M19041 Primary osteoarthritis, right hand: Secondary | ICD-10-CM | POA: Diagnosis not present

## 2019-11-05 DIAGNOSIS — Z03818 Encounter for observation for suspected exposure to other biological agents ruled out: Secondary | ICD-10-CM | POA: Diagnosis not present

## 2019-11-16 ENCOUNTER — Telehealth: Payer: Self-pay | Admitting: Family Medicine

## 2019-11-16 NOTE — Telephone Encounter (Signed)
Patient is with her grandchildren and does not have her schedule. Will call us back once she knows. No further questions at this time.

## 2019-11-16 NOTE — Telephone Encounter (Signed)
Please call patient she is due for a Pap smear.  Now that she is 33 but this would be her last one assuming that it was normal.  Please have her schedule this summer.  Or if she has a GYN and already go somewhere then please let us know.

## 2019-11-21 ENCOUNTER — Encounter: Payer: Self-pay | Admitting: Family Medicine

## 2019-12-06 ENCOUNTER — Ambulatory Visit (INDEPENDENT_AMBULATORY_CARE_PROVIDER_SITE_OTHER): Payer: Medicare Other | Admitting: Family Medicine

## 2019-12-06 ENCOUNTER — Other Ambulatory Visit: Payer: Self-pay

## 2019-12-06 ENCOUNTER — Other Ambulatory Visit (HOSPITAL_COMMUNITY)
Admission: RE | Admit: 2019-12-06 | Discharge: 2019-12-06 | Disposition: A | Discharge status: Home / Self Care | Payer: Medicare Other | Source: Ambulatory Visit | Attending: Family Medicine | Admitting: Family Medicine

## 2019-12-06 ENCOUNTER — Encounter: Payer: Self-pay | Admitting: Family Medicine

## 2019-12-06 VITALS — BP 130/87 | HR 75 | Ht 64.0 in | Wt 166.0 lb

## 2019-12-06 DIAGNOSIS — R413 Other amnesia: Secondary | ICD-10-CM

## 2019-12-06 DIAGNOSIS — M6530 Trigger finger, unspecified finger: Secondary | ICD-10-CM

## 2019-12-06 DIAGNOSIS — I10 Essential (primary) hypertension: Secondary | ICD-10-CM

## 2019-12-06 DIAGNOSIS — Z1151 Encounter for screening for human papillomavirus (HPV): Secondary | ICD-10-CM | POA: Diagnosis not present

## 2019-12-06 DIAGNOSIS — Z124 Encounter for screening for malignant neoplasm of cervix: Secondary | ICD-10-CM | POA: Insufficient documentation

## 2019-12-06 DIAGNOSIS — Z0000 Encounter for general adult medical examination without abnormal findings: Secondary | ICD-10-CM | POA: Diagnosis not present

## 2019-12-06 DIAGNOSIS — E119 Type 2 diabetes mellitus without complications: Secondary | ICD-10-CM | POA: Diagnosis not present

## 2019-12-06 DIAGNOSIS — F5101 Primary insomnia: Secondary | ICD-10-CM

## 2019-12-06 LAB — POCT GLYCOSYLATED HEMOGLOBIN (HGB A1C): Hemoglobin A1C: 6.9 % — AB (ref 4.0–5.6)

## 2019-12-06 MED ORDER — TRULICITY 1.5 MG/0.5ML ~~LOC~~ SOAJ: SUBCUTANEOUS | 4 refills | Status: DC

## 2019-12-06 MED ORDER — ATORVASTATIN CALCIUM 20 MG PO TABS: 20.0000 mg | ORAL_TABLET | Freq: Every day | ORAL | 3 refills | Status: DC

## 2019-12-06 MED ORDER — OMEPRAZOLE 20 MG PO CPDR: 20.0000 mg | DELAYED_RELEASE_CAPSULE | Freq: Every day | ORAL | 3 refills | Status: DC

## 2019-12-06 MED ORDER — TELMISARTAN 80 MG PO TABS: 80.0000 mg | ORAL_TABLET | Freq: Every day | ORAL | 1 refills | Status: DC

## 2019-12-06 MED ORDER — ZOLPIDEM TARTRATE 5 MG PO TABS: 2.5000 mg | ORAL_TABLET | Freq: Every evening | ORAL | 1 refills | Status: DC | PRN

## 2019-12-06 MED ORDER — CITALOPRAM HYDROBROMIDE 40 MG PO TABS: 40.0000 mg | ORAL_TABLET | Freq: Every day | ORAL | 1 refills | Status: DC

## 2019-12-06 NOTE — Patient Instructions (Signed)

## 2019-12-06 NOTE — Assessment & Plan Note (Addendum)
Referral to Dr. Benjamin Stain for more definitive treatment with possible injection.

## 2019-12-06 NOTE — Assessment & Plan Note (Signed)
A1c 6.9 today which is up from her previous but she thinks she may have messed up her insulin for a couple of weeks. Just encouraged her to continue work on healthy diet and regular exercise.

## 2019-12-06 NOTE — Assessment & Plan Note (Signed)
  RF ambien today.

## 2019-12-06 NOTE — Progress Notes (Signed)
Subjective:     Ann Torres is a 65 y.o. female and is here for a comprehensive physical exam. The patient reports no problems. She helps take care of her grandkids she reports that she had a hysterectomy in her early 77s.  She is not sure if she still has a cervix or not but says she never had a Pap smear since then.  She is worried her short-term memory has been getting worse.  She said she had a missed about 2 weeks where she could not remember if she had taken her insulin.  Normally she has a technique to remember if she given her shot or not and so she really just could not remember how to do it.  She does not note any specific problems with things like calculations and math etc. does not like she is had problems with her long-term memory.  She also has a trigger finger on her right middle finger and left fourth finger and wants to know who to see for treatment.  Diabetes - no hypoglycemic events. No wounds or sores that are not healing well. No increased thirst or urination. Checking glucose at home. Taking medications as prescribed without any side effects.  Have significant difficulty remembering her insulin for about 2 weeks.   Social History   Socioeconomic History  . Marital status: Divorced    Spouse name: Not on file  . Number of children: Not on file  . Years of education: 2 yr college  . Highest education level: Not on file  Occupational History  . Occupation: retired  Tobacco Use  . Smoking status: Former Smoker    Types: Cigarettes    Quit date: 08/01/2017    Years since quitting: 2.3  . Smokeless tobacco: Never Used  Substance and Sexual Activity  . Alcohol use: No  . Drug use: No  . Sexual activity: Never  Other Topics Concern  . Not on file  Social History Narrative   Looks for 15 minutes daily. Denies significant caffeine intake. Currently divorced and not sexually active. Currently living with her daughter and daughter's family.   Social Determinants of  Health   Financial Resource Strain:   . Difficulty of Paying Living Expenses:   Food Insecurity:   . Worried About Programme researcher, broadcasting/film/video in the Last Year:   . Barista in the Last Year:   Transportation Needs:   . Freight forwarder (Medical):   Marland Kitchen Lack of Transportation (Non-Medical):   Physical Activity:   . Days of Exercise per Week:   . Minutes of Exercise per Session:   Stress:   . Feeling of Stress :   Social Connections:   . Frequency of Communication with Friends and Family:   . Frequency of Social Gatherings with Friends and Family:   . Attends Religious Services:   . Active Member of Clubs or Organizations:   . Attends Banker Meetings:   Marland Kitchen Marital Status:   Intimate Partner Violence:   . Fear of Current or Ex-Partner:   . Emotionally Abused:   Marland Kitchen Physically Abused:   . Sexually Abused:    Health Maintenance  Topic Date Due  . OPHTHALMOLOGY EXAM  04/21/2019  . DEXA SCAN  Never done  . PNA vac Low Risk Adult (1 of 2 - PCV13) Never done  . INFLUENZA VACCINE  12/02/2019  . COLONOSCOPY  02/01/2020 (Originally 10/11/2004)  . PAP SMEAR-Modifier  08/29/2020 (Originally 10/12/1975)  . Hepatitis  C Screening  03/04/2023 (Originally 21-Dec-1955)  . HIV Screening  02/27/2027 (Originally 10/11/1969)  . FOOT EXAM  02/01/2020  . HEMOGLOBIN A1C  06/07/2020  . MAMMOGRAM  01/31/2021  . TETANUS/TDAP  07/02/2022  . COVID-19 Vaccine  Completed     The following portions of the patient's history were reviewed and updated as appropriate: allergies, current medications, past family history, past medical history, past social history, past surgical history and problem list.  Review of Systems A comprehensive review of systems was negative.   Objective:    BP 130/87   Pulse 75   Ht 5\' 4"  (1.626 m)   Wt 166 lb (75.3 kg)   SpO2 100%   BMI 28.49 kg/m  General appearance: alert, cooperative and appears stated age Head: Normocephalic, without obvious abnormality,  atraumatic Eyes: conj clear, EOMi, PEERLA Ears: normal TM's and external ear canals both ears Nose: Nares normal. Septum midline. Mucosa normal. No drainage or sinus tenderness. Throat: lips, mucosa, and tongue normal; teeth and gums normal Neck: no adenopathy, no carotid bruit, no JVD, supple, symmetrical, trachea midline and thyroid not enlarged, symmetric, no tenderness/mass/nodules Back: symmetric, no curvature. ROM normal. No CVA tenderness. Lungs: clear to auscultation bilaterally Breasts: normal appearance, no masses or tenderness Heart: regular rate and rhythm, S1, S2 normal, no murmur, click, rub or gallop Abdomen: soft, non-tender; bowel sounds normal; no masses,  no organomegaly Pelvic: no adnexal masses or tenderness, rectovaginal septum normal and vagina normal without discharge Extremities: extremities normal, atraumatic, no cyanosis or edema Pulses: 2+ and symmetric Skin: Skin color, texture, turgor normal. No rashes or lesions Lymph nodes: Cervical, supraclavicular, and axillary nodes normal. Neurologic: Alert and oriented X 3, normal strength and tone. Normal symmetric reflexes. Normal coordination and gait    Assessment:    Healthy female exam.      Plan:     See After Visit Summary for Counseling Recommendations   Keep up a regular exercise program and make sure you are eating a healthy diet Try to eat 4 servings of dairy a day, or if you are lactose intolerant take a calcium with vitamin D daily.  Your vaccines are up to date.   Diabetes mellitus without complication (HCC) A1c 6.9 today which is up from her previous but she thinks she may have messed up her insulin for a couple of weeks. Just encouraged her to continue work on healthy diet and regular exercise.  Trigger finger Referral to Dr. for more definitive treatment with possible injection.  Memory impairment Mostly feeling like she is struggling with short-term memory. We did do a  Mini-Mental status exam today. Score of 29 out of 30. Passing for her age and level of college education is 68 out of 30. She missed 1 for giving incorrect date. We will do some additional labs including thyroid, CBC, RPR, and B12 levels  Primary insomnia  RF ambien today.

## 2019-12-06 NOTE — Assessment & Plan Note (Signed)
Mostly feeling like she is struggling with short-term memory. We did do a Mini-Mental status exam today. Score of 29 out of 30. Passing for her age and level of college education is 55 out of 30. She missed 1 for giving incorrect date. We will do some additional labs including thyroid, CBC, RPR, and B12 levels

## 2019-12-07 DIAGNOSIS — Z0000 Encounter for general adult medical examination without abnormal findings: Secondary | ICD-10-CM | POA: Diagnosis not present

## 2019-12-07 DIAGNOSIS — E119 Type 2 diabetes mellitus without complications: Secondary | ICD-10-CM | POA: Diagnosis not present

## 2019-12-07 DIAGNOSIS — I10 Essential (primary) hypertension: Secondary | ICD-10-CM | POA: Diagnosis not present

## 2019-12-07 DIAGNOSIS — R413 Other amnesia: Secondary | ICD-10-CM | POA: Diagnosis not present

## 2019-12-07 LAB — CYTOLOGY - PAP
Comment: NEGATIVE
Diagnosis: NEGATIVE
High risk HPV: NEGATIVE

## 2019-12-11 LAB — IRON,TIBC AND FERRITIN PANEL
%SAT: 17 % (calc) (ref 16–45)
Ferritin: 67 ng/mL (ref 16–288)
Iron: 60 ug/dL (ref 45–160)
TIBC: 347 mcg/dL (calc) (ref 250–450)

## 2019-12-11 LAB — COMPLETE METABOLIC PANEL WITH GFR
AG Ratio: 1.8 (calc) (ref 1.0–2.5)
ALT: 13 U/L (ref 6–29)
AST: 14 U/L (ref 10–35)
Albumin: 4.2 g/dL (ref 3.6–5.1)
Alkaline phosphatase (APISO): 119 U/L (ref 37–153)
BUN: 8 mg/dL (ref 7–25)
CO2: 25 mmol/L (ref 20–32)
Calcium: 9.3 mg/dL (ref 8.6–10.4)
Chloride: 107 mmol/L (ref 98–110)
Creat: 0.66 mg/dL (ref 0.50–0.99)
GFR, Est African American: 107 mL/min/{1.73_m2} (ref >=60)
GFR, Est Non African American: 93 mL/min/{1.73_m2} (ref >=60)
Globulin: 2.4 g/dL (calc) (ref 1.9–3.7)
Glucose, Bld: 163 mg/dL — ABNORMAL HIGH (ref 65–99)
Potassium: 4.7 mmol/L (ref 3.5–5.3)
Sodium: 141 mmol/L (ref 135–146)
Total Bilirubin: 0.6 mg/dL (ref 0.2–1.2)
Total Protein: 6.6 g/dL (ref 6.1–8.1)

## 2019-12-11 LAB — LIPID PANEL
Cholesterol: 139 mg/dL (ref <200)
HDL: 54 mg/dL (ref >=50)
LDL Cholesterol (Calc): 62 mg/dL (calc)
Non-HDL Cholesterol (Calc): 85 mg/dL (calc) (ref <130)
Total CHOL/HDL Ratio: 2.6 (calc) (ref <5.0)
Triglycerides: 145 mg/dL (ref <150)

## 2019-12-11 LAB — CBC
HCT: 41.2 % (ref 35.0–45.0)
Hemoglobin: 13.6 g/dL (ref 11.7–15.5)
MCH: 26.3 pg — ABNORMAL LOW (ref 27.0–33.0)
MCHC: 33 g/dL (ref 32.0–36.0)
MCV: 79.5 fL — ABNORMAL LOW (ref 80.0–100.0)
MPV: 10.6 fL (ref 7.5–12.5)
Platelets: 228 10*3/uL (ref 140–400)
RBC: 5.18 10*6/uL — ABNORMAL HIGH (ref 3.80–5.10)
RDW: 13.9 % (ref 11.0–15.0)
WBC: 8.1 10*3/uL (ref 3.8–10.8)

## 2019-12-11 LAB — TSH: TSH: 0.86 mIU/L (ref 0.40–4.50)

## 2019-12-11 LAB — VITAMIN B12: Vitamin B-12: 378 pg/mL (ref 200–1100)

## 2019-12-11 LAB — RPR: RPR Ser Ql: NONREACTIVE

## 2019-12-12 NOTE — Progress Notes (Signed)
Iron studies came back. Everything in normal range, but saturation is on the low end. Recommend daily iron supplement of at least 325mg  Ferrous Sulfate.   Multivitamin with B12 added would help with both her low B12 and Iron.

## 2019-12-25 DIAGNOSIS — H905 Unspecified sensorineural hearing loss: Secondary | ICD-10-CM | POA: Diagnosis not present

## 2020-01-03 ENCOUNTER — Ambulatory Visit: Payer: Self-pay

## 2020-01-03 ENCOUNTER — Ambulatory Visit (INDEPENDENT_AMBULATORY_CARE_PROVIDER_SITE_OTHER): Payer: Medicare Other | Admitting: Sports Medicine

## 2020-01-03 DIAGNOSIS — M6530 Trigger finger, unspecified finger: Secondary | ICD-10-CM | POA: Diagnosis not present

## 2020-01-03 NOTE — Progress Notes (Signed)
    Procedures performed today:    Procedure: Real-time Ultrasound Guided injection of the left fourth flexor tendon sheath Device: Samsung HS60  Verbal informed consent obtained.  Time-out conducted.  Noted no overlying erythema, induration, or other signs of local infection.  Skin prepped in a sterile fashion.  Local anesthesia: Topical Ethyl chloride.  With sterile technique and under real time ultrasound guidance:  1/2 cc lidocaine, 1/2 cc kenalog 40 injected easily  completed without difficulty  Pain immediately resolved suggesting accurate placement of the medication.  Advised to call if fevers/chills, erythema, induration, drainage, or persistent bleeding.  Images permanently stored and available for review in PACS.  Impression: Technically successful ultrasound guided injection.  Procedure: Real-time Ultrasound Guided injection of the right third flexor tendon sheath Device: Samsung HS60  Verbal informed consent obtained.  Time-out conducted.  Noted no overlying erythema, induration, or other signs of local infection.  Skin prepped in a sterile fashion.  Local anesthesia: Topical Ethyl chloride.  With sterile technique and under real time ultrasound guidance:  1/2 cc lidocaine, 1/2 cc kenalog 40 injected easily  completed without difficulty  Pain immediately resolved suggesting accurate placement of the medication.  Advised to call if fevers/chills, erythema, induration, drainage, or persistent bleeding.  Images permanently stored and available for review in PACS.  Impression: Technically successful ultrasound guided injection.  ___________________________________________ Ihor Austin. Benjamin Stain, M.D., ABFM., CAQSM. Primary Care and Sports Medicine [facility]   Adjunct Instructor of Family Medicine  [employer]  Independent interpretation of notes and tests performed by another provider:   None.  Brief  History, Exam, Impression, and Recommendations:    Trigger finger This is a very pleasant 64 year old female, she has trigger fingers bilaterally, left ring, right third. We did injections today, conditioning exercises given, pathophysiology explained. Return to see me in a month.    ___________________________________________ Ihor Austin. Benjamin Stain, M.D., ABFM., CAQSM. Primary Care and Sports Medicine [facility]  Adjunct Instructor of Family Medicine  [employer]

## 2020-01-03 NOTE — Assessment & Plan Note (Signed)
This is a very pleasant 64 year old female, she has trigger fingers bilaterally, left ring, right third. We did injections today, conditioning exercises given, pathophysiology explained. Return to see me in a month.

## 2020-01-08 DIAGNOSIS — M6530 Trigger finger, unspecified finger: Secondary | ICD-10-CM

## 2020-01-31 ENCOUNTER — Ambulatory Visit (INDEPENDENT_AMBULATORY_CARE_PROVIDER_SITE_OTHER): Payer: Medicare Other | Admitting: Sports Medicine

## 2020-01-31 ENCOUNTER — Encounter: Payer: Self-pay | Admitting: Sports Medicine

## 2020-01-31 DIAGNOSIS — Z23 Encounter for immunization: Secondary | ICD-10-CM

## 2020-01-31 DIAGNOSIS — M6530 Trigger finger, unspecified finger: Secondary | ICD-10-CM | POA: Diagnosis not present

## 2020-01-31 NOTE — Progress Notes (Signed)
    Procedures performed today:    None.  Independent interpretation of notes and tests performed by another provider:   None.  Brief History, Exam, Impression, and Recommendations:    Trigger finger Ann Torres returns, she is a very pleasant 64 year old female, last month we did left ring and right middle trigger finger injections. Her right hand has recovered completely, she still has very mild triggering, no pain on the left. She has an appointment with plastic surgery tomorrow to discuss trigger finger release, her symptoms are very very mild. I have asked her to think long and hard as to whether she can live with this for now. I can see her back on an as-needed basis.    ___________________________________________ Ihor Austin. Benjamin Stain, M.D., ABFM., CAQSM. Primary Care and Sports Medicine [facility]  Adjunct Instructor of Family Medicine  [employer]

## 2020-01-31 NOTE — Assessment & Plan Note (Signed)
Ann Torres returns, she is a very pleasant 64 year old female, last month we did left ring and right middle trigger finger injections. Her right hand has recovered completely, she still has very mild triggering, no pain on the left. She has an appointment with plastic surgery tomorrow to discuss trigger finger release, her symptoms are very very mild. I have asked her to think long and hard as to whether she can live with this for now. I can see her back on an as-needed basis.

## 2020-02-01 DIAGNOSIS — M65322 Trigger finger, left index finger: Secondary | ICD-10-CM | POA: Diagnosis not present

## 2020-02-05 ENCOUNTER — Other Ambulatory Visit: Payer: Self-pay | Admitting: Family Medicine

## 2020-02-05 DIAGNOSIS — Z1231 Encounter for screening mammogram for malignant neoplasm of breast: Secondary | ICD-10-CM

## 2020-02-14 ENCOUNTER — Ambulatory Visit (INDEPENDENT_AMBULATORY_CARE_PROVIDER_SITE_OTHER): Payer: Medicare Other

## 2020-02-14 ENCOUNTER — Other Ambulatory Visit: Payer: Self-pay

## 2020-02-14 DIAGNOSIS — Z1231 Encounter for screening mammogram for malignant neoplasm of breast: Secondary | ICD-10-CM

## 2020-03-06 ENCOUNTER — Encounter: Payer: Self-pay | Admitting: Family Medicine

## 2020-03-06 ENCOUNTER — Ambulatory Visit (INDEPENDENT_AMBULATORY_CARE_PROVIDER_SITE_OTHER): Payer: Medicare Other | Admitting: Family Medicine

## 2020-03-06 VITALS — BP 131/85 | HR 87 | Ht 64.0 in | Wt 170.0 lb

## 2020-03-06 DIAGNOSIS — F339 Major depressive disorder, recurrent, unspecified: Secondary | ICD-10-CM

## 2020-03-06 DIAGNOSIS — Z23 Encounter for immunization: Secondary | ICD-10-CM

## 2020-03-06 DIAGNOSIS — M069 Rheumatoid arthritis, unspecified: Secondary | ICD-10-CM | POA: Diagnosis not present

## 2020-03-06 DIAGNOSIS — M25562 Pain in left knee: Secondary | ICD-10-CM

## 2020-03-06 DIAGNOSIS — E119 Type 2 diabetes mellitus without complications: Secondary | ICD-10-CM | POA: Diagnosis not present

## 2020-03-06 DIAGNOSIS — M25551 Pain in right hip: Secondary | ICD-10-CM

## 2020-03-06 DIAGNOSIS — M25552 Pain in left hip: Secondary | ICD-10-CM

## 2020-03-06 DIAGNOSIS — I10 Essential (primary) hypertension: Secondary | ICD-10-CM

## 2020-03-06 LAB — POCT GLYCOSYLATED HEMOGLOBIN (HGB A1C): Hemoglobin A1C: 6.1 % — AB (ref 4.0–5.6)

## 2020-03-06 MED ORDER — AMBULATORY NON FORMULARY MEDICATION
0 refills | Status: DC
Start: 2020-03-06 — End: 2020-11-26

## 2020-03-06 NOTE — Progress Notes (Signed)
Established Patient Office Visit  Subjective:  Patient ID: Ann Torres, female    DOB: 07-15-1955  Age: 64 y.o. MRN: [id number]  CC:  Chief Complaint  Patient presents with  . Diabetes    HPI Ann Torres presents for    Diabetes - no hypoglycemic events. No wounds or sores that are not healing well. No increased thirst or urination. Checking glucose at home. Taking medications as prescribed without any side effects.  She has not had a chance to schedule her eye exam.  Follow-up rheumatoid arthritis-currently being followed by Dr. Allena Katz at [facility].  Is been on Orencia for about 3 years now and is doing well.  She feels like her hips have become more painful recently.  And she is also having more persistent left knee pain with occasionally giving out.  Follow-up recurrent depression.  Currently on citalopram 40 mg doing well.  History reviewed. No pertinent past medical history.  Past Surgical History:  Procedure Laterality Date  . BILATERAL OOPHORECTOMY    . BREAST BIOPSY     in pts 30s  . BREAST CYST EXCISION    . CESAREAN SECTION     x 3  . CHOLECYSTECTOMY    . TOTAL ABDOMINAL HYSTERECTOMY    . TUBAL LIGATION      Family History  Problem Relation Age of Onset  . Colon cancer Mother   . Heart attack Father 26    Social History   Socioeconomic History  . Marital status: Divorced    Spouse name: Not on file  . Number of children: Not on file  . Years of education: 2 yr college  . Highest education level: Not on file  Occupational History  . Occupation: retired  Tobacco Use  . Smoking status: Former Smoker    Types: Cigarettes    Quit date: 08/01/2017    Years since quitting: 2.5  . Smokeless tobacco: Never Used  Substance and Sexual Activity  . Alcohol use: No  . Drug use: No  . Sexual activity: Never  Other Topics Concern  . Not on file  Social History Narrative   Looks for 15 minutes daily. Denies significant caffeine intake. Currently  divorced and not sexually active. Currently living with her daughter and daughter's family.   Social Determinants of Health   Financial Resource Strain:   . Difficulty of Paying Living Expenses: Not on file  Food Insecurity:   . Worried About Programme researcher, broadcasting/film/video in the Last Year: Not on file  . Ran Out of Food in the Last Year: Not on file  Transportation Needs:   . Lack of Transportation (Medical): Not on file  . Lack of Transportation (Non-Medical): Not on file  Physical Activity:   . Days of Exercise per Week: Not on file  . Minutes of Exercise per Session: Not on file  Stress:   . Feeling of Stress : Not on file  Social Connections:   . Frequency of Communication with Friends and Family: Not on file  . Frequency of Social Gatherings with Friends and Family: Not on file  . Attends Religious Services: Not on file  . Active Member of Clubs or Organizations: Not on file  . Attends Banker Meetings: Not on file  . Marital Status: Not on file  Intimate Partner Violence:   . Fear of Current or Ex-Partner: Not on file  . Emotionally Abused: Not on file  . Physically Abused: Not on file  .  Sexually Abused: Not on file    Outpatient Medications Prior to Visit  Medication Sig Dispense Refill  . Abatacept (ORENCIA CLICKJECT) 125 MG/ML SOAJ Inject into the skin.    Marland Kitchen atorvastatin (LIPITOR) 20 MG tablet Take 1 tablet (20 mg total) by mouth at bedtime. 90 tablet 3  . citalopram (CELEXA) 40 MG tablet Take 1 tablet (40 mg total) by mouth daily. 90 tablet 1  . Dulaglutide (TRULICITY) 1.5 MG/0.5ML SOPN INJECT 1.5 MG UNDER THE SKIN EVERY 7 DAYS. 6 mL 4  . Insulin Lispro Prot & Lispro (HUMALOG 75/25 MIX) (75-25) 100 UNIT/ML Kwikpen Inject 15 units under skin twice a day, before breakfast and supper 15 mL 3  . Insulin Pen Needle (EASY TOUCH PEN NEEDLES) 31G X 6 MM MISC Use to inject insulin. 100 each 10  . omeprazole (PRILOSEC) 20 MG capsule Take 1 capsule (20 mg total) by mouth  daily. 90 capsule 3  . telmisartan (MICARDIS) 80 MG tablet Take 1 tablet (80 mg total) by mouth daily. 90 tablet 1  . zolpidem (AMBIEN) 5 MG tablet Take 0.5-1 tablets (2.5-5 mg total) by mouth at bedtime as needed for sleep. 90 tablet 1   No facility-administered medications prior to visit.    Allergies  Allergen Reactions  . Lisinopril Shortness Of Breath  . Azathioprine Nausea And Vomiting  . Metformin And Related Other (See Comments)    Diarrhea and vomiting  . Red Dye Other (See Comments)  . Enbrel [Etanercept] Other (See Comments)    ENT felt was causing hearing loss  . Marcelline Deist [Dapagliflozin] Other (See Comments)    Recurrent yeast infections  . Hydroxychloroquine Rash  . Methotrexate Derivatives Rash  . Tape Rash    r    ROS Review of Systems    Objective:    Physical Exam Constitutional:      Appearance: She is well-developed.  HENT:     Head: Normocephalic and atraumatic.  Cardiovascular:     Rate and Rhythm: Normal rate and regular rhythm.     Heart sounds: Normal heart sounds.  Pulmonary:     Effort: Pulmonary effort is normal.     Breath sounds: Normal breath sounds.  Skin:    General: Skin is warm and dry.  Neurological:     Mental Status: She is alert and oriented to person, place, and time.  Psychiatric:        Behavior: Behavior normal.     BP 131/85   Pulse 87   Ht 5\' 4"  (1.626 m)   Wt 170 lb (77.1 kg)   SpO2 100%   BMI 29.18 kg/m  Wt Readings from Last 3 Encounters:  03/06/20 170 lb (77.1 kg)  12/06/19 166 lb (75.3 kg)  08/07/19 168 lb (76.2 kg)     Health Maintenance Due  Topic Date Due  . COLONOSCOPY  Never done  . OPHTHALMOLOGY EXAM  04/21/2019  . DEXA SCAN  Never done  . FOOT EXAM  02/01/2020    There are no preventive care reminders to display for this patient.  Lab Results  Component Value Date   TSH 0.86 12/07/2019   Lab Results  Component Value Date   WBC 8.1 12/07/2019   HGB 13.6 12/07/2019   HCT 41.2  12/07/2019   MCV 79.5 (L) 12/07/2019   PLT 228 12/07/2019   Lab Results  Component Value Date   NA 141 12/07/2019   K 4.7 12/07/2019   CO2 25 12/07/2019   GLUCOSE 163 (H)  12/07/2019   BUN 8 12/07/2019   CREATININE 0.66 12/07/2019   BILITOT 0.6 12/07/2019   ALKPHOS 113 08/23/2018   AST 14 12/07/2019   ALT 13 12/07/2019   PROT 6.6 12/07/2019   ALBUMIN 4.6 08/23/2018   CALCIUM 9.3 12/07/2019   Lab Results  Component Value Date   CHOL 139 12/07/2019   Lab Results  Component Value Date   HDL 54 12/07/2019   Lab Results  Component Value Date   LDLCALC 62 12/07/2019   Lab Results  Component Value Date   TRIG 145 12/07/2019   Lab Results  Component Value Date   CHOLHDL 2.6 12/07/2019   Lab Results  Component Value Date   HGBA1C 6.1 (A) 03/06/2020      Assessment & Plan:   Problem List Items Addressed This Visit      Cardiovascular and Mediastinum   Essential hypertension    Well controlled. Continue current regimen. Follow up in  3-4 months.         Endocrine   Diabetes mellitus without complication (HCC) - Primary    A1c looks phenomenal today at 6.1.  She is not having any lows and says she feels great in fact it is the best she has felt in quite some time.      Relevant Orders   POCT HgB A1C (Completed)   Ambulatory referral to Optometry     Musculoskeletal and Integument   Rheumatoid arthritis involving multiple sites [facility])    Currently on Orencia.  Doing well overall though has been having more hip and left knee pain she would like to get x-rays done.        Other   Depression, recurrent (HCC)    Stable.         Other Visit Diagnoses    Acute pain of left knee       Relevant Orders   DG Knee Complete 4 Views Left   Hip pain, bilateral       Relevant Orders   DG Hip Unilat W OR W/O Pelvis 2-3 Views Right   DG Hip Unilat W OR W/O Pelvis 2-3 Views Left     Given Prevnar 13 today.  Given a script for Shingrix today.    Meds ordered  this encounter  Medications  . AMBULATORY NON FORMULARY MEDICATION    Sig: Medication Name: Shingrix IM x1.  Then repeat in 2-6 months.    Dispense:  1 Units    Refill:  0    Follow-up: Return in about 4 months (around 07/04/2020) for Diabetes follow-up.    Nani Gasser, MD

## 2020-03-06 NOTE — Assessment & Plan Note (Signed)
Stable

## 2020-03-06 NOTE — Assessment & Plan Note (Signed)
A1c looks phenomenal today at 6.1.  She is not having any lows and says she feels great in fact it is the best she has felt in quite some time.

## 2020-03-06 NOTE — Assessment & Plan Note (Signed)
Well controlled. Continue current regimen. Follow up in  3-4 months.  

## 2020-03-06 NOTE — Assessment & Plan Note (Signed)
Currently on Orencia.  Doing well overall though has been having more hip and left knee pain she would like to get x-rays done.

## 2020-03-12 ENCOUNTER — Ambulatory Visit (INDEPENDENT_AMBULATORY_CARE_PROVIDER_SITE_OTHER): Payer: Medicare Other

## 2020-03-12 ENCOUNTER — Other Ambulatory Visit: Payer: Self-pay

## 2020-03-12 ENCOUNTER — Other Ambulatory Visit: Payer: Self-pay | Admitting: Family Medicine

## 2020-03-12 DIAGNOSIS — M25552 Pain in left hip: Secondary | ICD-10-CM | POA: Diagnosis not present

## 2020-03-12 DIAGNOSIS — M25551 Pain in right hip: Secondary | ICD-10-CM

## 2020-03-12 DIAGNOSIS — M069 Rheumatoid arthritis, unspecified: Secondary | ICD-10-CM | POA: Diagnosis not present

## 2020-03-12 DIAGNOSIS — M1712 Unilateral primary osteoarthritis, left knee: Secondary | ICD-10-CM | POA: Diagnosis not present

## 2020-03-12 DIAGNOSIS — E119 Type 2 diabetes mellitus without complications: Secondary | ICD-10-CM | POA: Diagnosis not present

## 2020-03-12 DIAGNOSIS — M25562 Pain in left knee: Secondary | ICD-10-CM

## 2020-03-12 LAB — HM DIABETES EYE EXAM

## 2020-03-13 ENCOUNTER — Encounter: Payer: Self-pay | Admitting: Family Medicine

## 2020-03-13 DIAGNOSIS — M069 Rheumatoid arthritis, unspecified: Secondary | ICD-10-CM

## 2020-03-13 DIAGNOSIS — M25551 Pain in right hip: Secondary | ICD-10-CM

## 2020-03-13 NOTE — Telephone Encounter (Signed)
Physical therapy order pended

## 2020-03-17 ENCOUNTER — Other Ambulatory Visit: Payer: Self-pay

## 2020-03-17 ENCOUNTER — Ambulatory Visit (INDEPENDENT_AMBULATORY_CARE_PROVIDER_SITE_OTHER): Payer: Medicare Other | Admitting: Rehabilitative and Restorative Service Providers"

## 2020-03-17 ENCOUNTER — Encounter: Payer: Self-pay | Admitting: Rehabilitative and Restorative Service Providers"

## 2020-03-17 DIAGNOSIS — M6281 Muscle weakness (generalized): Secondary | ICD-10-CM

## 2020-03-17 DIAGNOSIS — M25562 Pain in left knee: Secondary | ICD-10-CM | POA: Diagnosis not present

## 2020-03-17 DIAGNOSIS — M25551 Pain in right hip: Secondary | ICD-10-CM | POA: Diagnosis not present

## 2020-03-17 DIAGNOSIS — M25552 Pain in left hip: Secondary | ICD-10-CM | POA: Diagnosis not present

## 2020-03-17 DIAGNOSIS — G8929 Other chronic pain: Secondary | ICD-10-CM

## 2020-03-17 DIAGNOSIS — R29898 Other symptoms and signs involving the musculoskeletal system: Secondary | ICD-10-CM | POA: Diagnosis not present

## 2020-03-17 NOTE — Patient Instructions (Signed)
Access Code: [email or web address]: [email or web address]: 11/15/2021Prepared by: Abdulwahab Demelo HoltExercises  Hooklying Hamstring Stretch with Strap - 2 x daily - 7 x weekly - 1 sets - 3 reps - 30 sec hold  Supine ITB Stretch with Strap - 2 x daily - 7 x weekly - 1 sets - 3 reps - 30 sec hold  Supine Piriformis Stretch with Leg Straight - 2 x daily - 7 x weekly - 1 sets - 3 reps - 30 sec hold  Hip Flexor Stretch at Edge of Bed - 2 x daily - 7 x weekly - 1 sets - 3 reps - 30 sec hold Patient Education  TENS Unit  Trigger Point Dry Needling

## 2020-03-17 NOTE — Therapy (Signed)
[facility] [place] [address] [place], [place], [address] Phone: [phone]   Fax:  [phone]  Physical Therapy Evaluation  Patient Details  Name: Ann Torres MRN: [id number] Date of Birth: April 02, 1956 Referring Provider (PT): Dr Linford Arnold    Encounter Date: 03/17/2020   PT End of Session - 03/17/20 1718    Visit Number 1    Number of Visits 12    Date for PT Re-Evaluation 04/28/20    PT Start Time 1433    PT Stop Time 1518    PT Time Calculation (min) 45 min    Activity Tolerance Patient tolerated treatment well           History reviewed. No pertinent past medical history.  Past Surgical History:  Procedure Laterality Date  . BILATERAL OOPHORECTOMY    . BREAST BIOPSY     in pts 30s  . BREAST CYST EXCISION    . CESAREAN SECTION     x 3  . CHOLECYSTECTOMY    . TOTAL ABDOMINAL HYSTERECTOMY    . TUBAL LIGATION      There were no vitals filed for this visit.    Subjective Assessment - 03/17/20 1436    Subjective Patient reports that she is having pain in the "whole pelvis" when she walks. She has not shooting pain. She has pain lying on either side. She also has pain in the Lt knee which sometimes "gives out" due to pain. Did walk ~ 3 miles a day but not in the 8-9 months. Played tennis and pickleball until ~ 5-7 monhts ago    Pertinent History Rheumatoid arthritis; HTN; HOH; C-section; hysterectomy; gall bladder removal 1992    Patient Stated Goals get rid of some of the pain without narcotics    Currently in Pain? Yes    Pain Score 8     Pain Location Hip    Pain Orientation Right;Left    Pain Descriptors / Indicators Constant;Aching    Pain Type Chronic pain    Pain Radiating Towards none    Pain Onset More than a month ago    Pain Frequency Constant    Aggravating Factors  walking; standing; lying on side; squatting; bending; lifting    Pain Relieving Factors heating pad              [facility] PT  Assessment - 03/17/20 0001      Assessment   Medical Diagnosis Bilat hip pain; Lt knee pain     Referring Provider (PT) Dr Linford Arnold     Onset Date/Surgical Date 03/03/20    Hand Dominance Left    Next MD Visit 07/07/20    Prior Therapy chiropractor in her 40's for sciatica       Precautions   Precautions None      Restrictions   Weight Bearing Restrictions No      Balance Screen   Has the patient fallen in the past 6 months No    Has the patient had a decrease in activity level because of a fear of falling?  No    Is the patient reluctant to leave their home because of a fear of falling?  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     Leisure walking ~ 10 min; household chores; sometimes exercises in the water       Observation/Other Assessments   Focus on Therapeutic Outcomes (FOTO)  60% limitation       Sensation   Additional Comments WFL's per pt report       Posture/Postural Control   Posture Comments head forward; flexed forward at hips; LE's in EF and abducted position       AROM   AROM Assessment Site --   all lumbar motioins produce pain in lateral hips    Right/Left Hip --   tight end ranges bilat hips Lt > Rt esp ext and rotation   Right/Left Knee --   WFL's Rt; tight Lt flexion    Lumbar Flexion 50%    Lumbar Extension 30%    Lumbar - Right Side Bend 60%    Lumbar - Left Side Bend 60%    Lumbar - Right Rotation 40%    Lumbar - Left Rotation 40%      Strength   Right Hip Flexion 4+/5    Right Hip Extension 4/5    Right Hip ABduction 4+/5    Left Hip Flexion 4/5    Left Hip Extension 4-/5    Left Hip ABduction 4-/5    Right  Knee Flexion 5/5    Right Knee Extension 5/5    Left Knee Flexion 4/5    Left Knee Extension 4/5      Flexibility   Hamstrings tight Lt > Rt     Quadriceps tight Lt     ITB tight Lt > Rt     Piriformis tight Lt > Rt       Palpation   Spinal mobility hypomobile lumbar spine with PA mobs     Palpation comment muscular tightness hip flexors; TFL; ITB; piriformis; posterior hip       Transfers   Comments difficulty with transfers and transitional movements       Ambulation/Gait   Gait Comments antalgic gait with wide based gait pattern; LE's in ER Lt > Rt       Balance   Balance Assessed --   SLS Rt 3 sec; Lt 5 sec                      Objective measurements completed on examination: See above findings.       [facility] Adult PT Treatment/Exercise - 03/17/20 0001      Lumbar Exercises: Stretches   Passive Hamstring Stretch Right;Left;2 reps;30 seconds   supine with strap    Hip Flexor Stretch Right;Left;2 reps;30 seconds   seated    ITB Stretch Right;Left;2 reps;30 seconds   supine with strap    Piriformis Stretch Right;Left;2 reps;30 seconds   supine travell with strap                  PT Education - 03/17/20 1516    Education Details HEP POC DN TENS    Person(s) Educated Patient    Methods Explanation;Demonstration;Tactile cues;Verbal cues;Handout    Comprehension Verbalized understanding;Returned demonstration;Verbal cues required;Tactile cues required               PT Long Term Goals - 03/17/20 1723      PT LONG TERM GOAL #1   Title Decrease  pain by 50-75% with functional activities including sit to stand; standing; walking; etc    Time 6    Period Weeks    Status New    Target Date 04/28/20      PT LONG TERM GOAL #2   Title Increase LE strength to 4+/5 to 5/5 throughout    Time 6    Period Weeks    Status New    Target Date 04/28/20      PT LONG TERM GOAL #3   Title Patient to increase walking tolerance to 20 min with minimal to  no increase in pain    Time 6    Period Weeks    Status New    Target Date 04/28/20      PT LONG TERM GOAL #4   Title Independent in HEP    Time 6    Period Weeks    Status New    Target Date 04/28/20      PT LONG TERM GOAL #5   Title Improve FOTO to </= 46% limitation    Time 6    Period Weeks    Status New    Target Date 04/28/20                  Plan - 03/17/20 1718    Clinical Impression Statement Patient presents with ~ 1 year history of bilat hip pain with no known injury. She has a history of RA which is managed with medication. Patient has poor posture and alignment; antalgic gait; difficulty with transfers and transitional movement; limited trunk and LE mobility and ROM; LE weakness; muscular tightness to palpation through the ant/lat/posterior hips into thighs; significant difficulty with ADL's; constant LE pain. She will benefit from PT to address limitations noted.    Stability/Clinical Decision Making Stable/Uncomplicated    Clinical Decision Making Low    Rehab Potential Good    PT Frequency 2x / week    PT Duration 6 weeks    PT Treatment/Interventions ADLs/Self Care Home Management;Aquatic Therapy;Cryotherapy;Electrical Stimulation;Iontophoresis 4mg /ml Dexamethasone;Moist Heat;Ultrasound;Gait training;Stair training;Functional mobility training;Therapeutic activities;Therapeutic exercise;Balance training;Neuromuscular re-education;Patient/family education;Manual techniques;Passive range of motion;Dry needling;Taping    PT Next Visit Plan review HEP; progress with quad and hip flexor stretches; add myofacial ball release work; modalities as indicated(pt may order TENS unit)    PT Home Exercise Plan [email or web address]    Consulted and Agree with Plan of Care Patient           Patient will benefit from skilled therapeutic intervention in order to improve the following deficits and impairments:     Visit Diagnosis: Pain in left hip - Plan: PT plan of care  cert/re-cert  Pain in right hip - Plan: PT plan of care cert/re-cert  Chronic pain of left knee - Plan: PT plan of care cert/re-cert  Muscle weakness (generalized) - Plan: PT plan of care cert/re-cert  Other symptoms and signs involving the musculoskeletal system - Plan: PT plan of care cert/re-cert     Problem List Patient Active Problem List   Diagnosis Date Noted  . Memory impairment 12/06/2019  . Trigger finger 08/07/2019  . Primary insomnia 03/23/2018  . Long-term use of high-risk medication 02/06/2018  . Calcific Achilles tendinitis of left lower extremity 10/06/2017  . Haglund's deformity of left heel 10/06/2017  . Essential hypertension 02/01/2017  . Rheumatoid arthritis involving multiple sites (HCC) 02/01/2017  . Diabetes mellitus without complication (HCC) 02/01/2017  . History of colon polyps 02/01/2017  . Depression, recurrent (  HCC) 02/01/2017  . Hyperlipidemia 02/01/2017    Amunique Neyra Rober Minion PT, MPH  03/17/2020, 5:28 PM  [facility] [address] [place], [place], [address] Phone: [phone]   Fax:  [phone]  Name: Ann Torres MRN: [id number] Date of Birth: October 14, 1955

## 2020-03-25 ENCOUNTER — Ambulatory Visit (INDEPENDENT_AMBULATORY_CARE_PROVIDER_SITE_OTHER): Payer: Medicare Other | Admitting: Physical Therapy

## 2020-03-25 ENCOUNTER — Encounter: Payer: Medicare Other | Admitting: Rehabilitative and Restorative Service Providers"

## 2020-03-25 ENCOUNTER — Other Ambulatory Visit: Payer: Self-pay

## 2020-03-25 DIAGNOSIS — R29898 Other symptoms and signs involving the musculoskeletal system: Secondary | ICD-10-CM | POA: Diagnosis not present

## 2020-03-25 DIAGNOSIS — M25551 Pain in right hip: Secondary | ICD-10-CM | POA: Diagnosis not present

## 2020-03-25 DIAGNOSIS — M25552 Pain in left hip: Secondary | ICD-10-CM

## 2020-03-25 DIAGNOSIS — M25562 Pain in left knee: Secondary | ICD-10-CM

## 2020-03-25 DIAGNOSIS — M6281 Muscle weakness (generalized): Secondary | ICD-10-CM | POA: Diagnosis not present

## 2020-03-25 DIAGNOSIS — G8929 Other chronic pain: Secondary | ICD-10-CM

## 2020-03-25 NOTE — Therapy (Signed)
[facility] [place] [address] [place], [place], [address] Phone: [phone]   Fax:  [phone]  Physical Therapy Treatment  Patient Details  Name: Ann Torres MRN: [id number] Date of Birth: Jun 29, 1955 Referring Provider (PT): Dr Linford Arnold    Encounter Date: 03/25/2020   PT End of Session - 03/25/20 0858    Visit Number 2    Number of Visits 12    Date for PT Re-Evaluation 04/28/20    PT Start Time 0851    PT Stop Time 0933    PT Time Calculation (min) 42 min    Activity Tolerance Patient tolerated treatment well;No increased pain    Behavior During Therapy [facility] for tasks assessed/performed           No past medical history on file.  Past Surgical History:  Procedure Laterality Date   BILATERAL OOPHORECTOMY     BREAST BIOPSY     in pts 30s   BREAST CYST EXCISION     CESAREAN SECTION     x 3   CHOLECYSTECTOMY     TOTAL ABDOMINAL HYSTERECTOMY     TUBAL LIGATION      There were no vitals filed for this visit.   Subjective Assessment - 03/25/20 0859    Subjective Pt reports she bought a TENS unit and strap and has been stretching 2x/day.   "I feel no change in pain yet".  she would like instruction in how to use TENS unit.    Currently in Pain? Yes    Pain Score 8     Pain Location Hip    Pain Orientation Left;Right   L>R   Pain Descriptors / Indicators Aching    Pain Radiating Towards down to side of thigh and across sacrum    Aggravating Factors  sitting, walking    Pain Relieving Factors heating pad              [facility] PT Assessment - 03/25/20 0001      Assessment   Medical Diagnosis Bilat hip pain; Lt knee pain     Referring Provider (PT) Dr Linford Arnold     Onset Date/Surgical Date 03/03/20    Hand Dominance Left    Next MD Visit 07/07/20    Prior Therapy chiropractor in her 40's for sciatica             [facility] Adult PT Treatment/Exercise - 03/25/20 0001      Self-Care   Self-Care  Other Self-Care Comments    Other Self-Care Comments  Pt instructed in self massage with roller stick and ball to hips and LE musculature; pt returned demo.  Pt educated on TENS application, safety, and parameters; pt verbalized understanding.       Lumbar Exercises: Stretches   Passive Hamstring Stretch Right;Left;2 reps;20 seconds   supine with strap   Hip Flexor Stretch Right;Left;2 reps;20 seconds   seated    Hip Flexor Stretch Limitations and standing with LE  back at counter x 30 sec     ITB Stretch Left;Right;1 rep;20 seconds   supine with strap   Piriformis Stretch Right;1 rep;60 seconds   modified pigeon pose at table.    Piriformis Stretch Limitations trial of fig 4 and travell, both painful     Other Lumbar Stretch Exercise standing adductor stretch with arms resting on counter x 3 reps, 5-10 sec each side.       Lumbar Exercises: Aerobic   Nustep L3: legs only x 4.5  min for warm up.       Lumbar Exercises: Seated   Sit to Stand 5 reps    Sit to Stand Limitations throughout session with TA engaged.                        PT Long Term Goals - 03/17/20 1723      PT LONG TERM GOAL #1   Title Decrease pain by 50-75% with functional activities including sit to stand; standing; walking; etc    Time 6    Period Weeks    Status New    Target Date 04/28/20      PT LONG TERM GOAL #2   Title Increase LE strength to 4+/5 to 5/5 throughout    Time 6    Period Weeks    Status New    Target Date 04/28/20      PT LONG TERM GOAL #3   Title Patient to increase walking tolerance to 20 min with minimal to no increase in pain    Time 6    Period Weeks    Status New    Target Date 04/28/20      PT LONG TERM GOAL #4   Title Independent in HEP    Time 6    Period Weeks    Status New    Target Date 04/28/20      PT LONG TERM GOAL #5   Title Improve FOTO to </= 46% limitation    Time 6    Period Weeks    Status New    Target Date 04/28/20                  Plan - 03/25/20 0939    Clinical Impression Statement Pt tolerated transitional movements better with core engaged.  Modified LE stretches for improved comfort and compliance.  Tolerated all well, without increase in pain.   Pt instructed in self care of massage and TENS application; verbalized understanding and returned demo. Encouraged pt to perform walking in pool for exercise with less stress on joints (has [employer] membership). Goals are ongoing.    Stability/Clinical Decision Making Stable/Uncomplicated    Rehab Potential Good    PT Frequency 2x / week    PT Duration 6 weeks    PT Treatment/Interventions ADLs/Self Care Home Management;Aquatic Therapy;Cryotherapy;Electrical Stimulation;Iontophoresis 4mg /ml Dexamethasone;Moist Heat;Ultrasound;Gait training;Stair training;Functional mobility training;Therapeutic activities;Therapeutic exercise;Balance training;Neuromuscular re-education;Patient/family education;Manual techniques;Passive range of motion;Dry needling;Taping    PT Next Visit Plan assess response to HEP changes; progress core;  manual therapy to hips.    PT Home Exercise Plan [email or web address]    Recommended Other Services Pt has TENS at home.    Consulted and Agree with Plan of Care Patient           Patient will benefit from skilled therapeutic intervention in order to improve the following deficits and impairments:     Visit Diagnosis: Pain in left hip  Pain in right hip  Chronic pain of left knee  Muscle weakness (generalized)  Other symptoms and signs involving the musculoskeletal system     Problem List Patient Active Problem List   Diagnosis Date Noted   Memory impairment 12/06/2019   Trigger finger 08/07/2019   Primary insomnia 03/23/2018   Long-term use of high-risk medication 02/06/2018   Calcific Achilles tendinitis of left lower extremity 10/06/2017   Haglund's deformity of left heel 10/06/2017   Essential hypertension 02/01/2017    Rheumatoid arthritis involving multiple  sites [facility]) 02/01/2017   Diabetes mellitus without complication (HCC) 02/01/2017   History of colon polyps 02/01/2017   Depression, recurrent (HCC) 02/01/2017   Hyperlipidemia 02/01/2017   Mayer Camel, PTA 03/25/20 9:47 AM   [facility] [place] [address] [place] [address] [place], [place], [address] Phone: [phone]   Fax:  [phone]  Name: Ann Torres MRN: [id number] Date of Birth: 09/02/1955

## 2020-03-25 NOTE — Patient Instructions (Addendum)
  Access Code: [email or web address]: [email or web address]: 11/23/2021Prepared by: [facility] [place]  Hooklying Hamstring Stretch with Strap - 2 x daily - 7 x weekly - 1 sets - 3 reps - 30 sec hold  Supine ITB Stretch with Strap - 2 x daily - 7 x weekly - 1 sets - 3 reps - 30 sec hold  Seated Table Piriformis Stretch - 2 x daily - 7 x weekly - 1 sets - 2-3 reps - 20-30 sec hold  Seated Transversus Abdominis Bracing - 2 x daily - 7 x weekly - 1 sets - 5 reps - 10 hold

## 2020-04-01 ENCOUNTER — Encounter: Payer: Medicare Other | Admitting: Rehabilitative and Restorative Service Providers"

## 2020-04-08 ENCOUNTER — Encounter: Payer: Medicare Other | Admitting: Rehabilitative and Restorative Service Providers"

## 2020-04-09 ENCOUNTER — Other Ambulatory Visit: Payer: Self-pay

## 2020-04-09 ENCOUNTER — Ambulatory Visit (INDEPENDENT_AMBULATORY_CARE_PROVIDER_SITE_OTHER): Payer: Medicare Other | Admitting: Physical Therapy

## 2020-04-09 ENCOUNTER — Encounter: Payer: Self-pay | Admitting: Physical Therapy

## 2020-04-09 DIAGNOSIS — G8929 Other chronic pain: Secondary | ICD-10-CM

## 2020-04-09 DIAGNOSIS — R29898 Other symptoms and signs involving the musculoskeletal system: Secondary | ICD-10-CM

## 2020-04-09 DIAGNOSIS — M25552 Pain in left hip: Secondary | ICD-10-CM

## 2020-04-09 DIAGNOSIS — M6281 Muscle weakness (generalized): Secondary | ICD-10-CM

## 2020-04-09 DIAGNOSIS — M25562 Pain in left knee: Secondary | ICD-10-CM

## 2020-04-09 DIAGNOSIS — M25551 Pain in right hip: Secondary | ICD-10-CM

## 2020-04-09 NOTE — Therapy (Signed)
[facility] [place] [address] [place] [address] [place], [place], [address] Phone: [phone]   Fax:  [phone]  Physical Therapy Treatment  Patient Details  Name: Ann Torres MRN: [id number] Date of Birth: 03/08/1956 Referring Provider (PT): Dr Linford Arnold    Encounter Date: 04/09/2020   PT End of Session - 04/09/20 1235    Visit Number 3    Number of Visits 12    Date for PT Re-Evaluation 04/28/20    PT Start Time 1148    PT Stop Time 1229    PT Time Calculation (min) 41 min    Activity Tolerance Patient tolerated treatment well;No increased pain    Behavior During Therapy [facility] for tasks assessed/performed           History reviewed. No pertinent past medical history.  Past Surgical History:  Procedure Laterality Date  . BILATERAL OOPHORECTOMY    . BREAST BIOPSY     in pts 30s  . BREAST CYST EXCISION    . CESAREAN SECTION     x 3  . CHOLECYSTECTOMY    . TOTAL ABDOMINAL HYSTERECTOMY    . TUBAL LIGATION      There were no vitals filed for this visit.   Subjective Assessment - 04/09/20 1149    Subjective My pain is down to a 7/10, I bought a TENS unit and it feels like its helping a lot. I still have trouble sleeping at night, I have immediate pain laying on either side.    Patient Stated Goals get rid of some of the pain without narcotics    Currently in Pain? Yes    Pain Score 7     Pain Location Hip    Pain Orientation Right;Left   bilateral   Pain Descriptors / Indicators Aching;Discomfort    Pain Type Chronic pain    Pain Radiating Towards radiates across sacrum              [facility] PT Assessment - 04/09/20 0001      Observation/Other Assessments   Observations L LE appears to be about a quarter inch longer than the right                          [facility] Adult PT Treatment/Exercise - 04/09/20 0001      Lumbar Exercises: Supine   Pelvic Tilt 10 reps    Bridge 10 reps      Manual Therapy    Manual Therapy Soft tissue mobilization    Manual therapy comments separate from all other skilled services     Soft tissue mobilization rolling pin to B glutes/piriformis, ball to B TFLs                   PT Education - 04/09/20 1234    Education Details heel lift and benefits there of, exercise form, encouraged her to continue with pool exercise and smoking cessation    Person(s) Educated Patient    Methods Explanation;Demonstration    Comprehension Verbalized understanding;Returned demonstration               PT Long Term Goals - 03/17/20 1723      PT LONG TERM GOAL #1   Title Decrease pain by 50-75% with functional activities including sit to stand; standing; walking; etc    Time 6    Period Weeks    Status New    Target Date 04/28/20  PT LONG TERM GOAL #2   Title Increase LE strength to 4+/5 to 5/5 throughout    Time 6    Period Weeks    Status New    Target Date 04/28/20      PT LONG TERM GOAL #3   Title Patient to increase walking tolerance to 20 min with minimal to no increase in pain    Time 6    Period Weeks    Status New    Target Date 04/28/20      PT LONG TERM GOAL #4   Title Independent in HEP    Time 6    Period Weeks    Status New    Target Date 04/28/20      PT LONG TERM GOAL #5   Title Improve FOTO to </= 46% limitation    Time 6    Period Weeks    Status New    Target Date 04/28/20                 Plan - 04/09/20 1235    Clinical Impression Statement [name] arrives today reporting the TENS at home has really helped and her pain has dropped to a 7/10 from a 10/10 using that machine. Exam immediately reveals about a quarter inch leg length discrepancy which appears to be structural and was corrected with a heel life and resulted in immediate reduction in pain. Spent a good part of session focusing on STM to B glutes/piriformis groups as well as TFL muscles with great release of soft tissue restrictions, as well as a  bit of time on strengthening and core activation. Pain reduced to 5/10 at EOS.    Personal Factors and Comorbidities Time since onset of injury/illness/exacerbation;Past/Current Experience;Comorbidity 2    Examination-Activity Limitations Squat;Bed Mobility;Stairs;Bend;Locomotion Level;Stand;Caring for Others;Carry;Sit;Sleep    Examination-Participation Restrictions Cleaning;Community Activity;Interpersonal Relationship;Yard Work;Driving    Stability/Clinical Decision Making Stable/Uncomplicated    Clinical Decision Making Low    Rehab Potential Good    PT Frequency 2x / week    PT Duration 6 weeks    PT Treatment/Interventions ADLs/Self Care Home Management;Aquatic Therapy;Cryotherapy;Electrical Stimulation;Iontophoresis /ml Dexamethasone;Moist Heat;Ultrasound;Gait training;Stair training;Functional mobility training;Therapeutic activities;Therapeutic exercise;Balance training;Neuromuscular re-education;Patient/family education;Manual techniques;Passive range of motion;Dry needling;Taping    PT Next Visit Plan assess response to/tolerance of heel lift; dry needling if able.Continue with core strength and manual therapy to hips.    PT Home Exercise Plan [email or web address]    Consulted and Agree with Plan of Care Patient           Patient will benefit from skilled therapeutic intervention in order to improve the following deficits and impairments:  Abnormal gait, Decreased mobility, Hypomobility, Difficulty walking, Increased muscle spasms, Improper body mechanics, Decreased activity tolerance, Increased fascial restricitons, Impaired flexibility, Postural dysfunction, Pain  Visit Diagnosis: Pain in left hip  Pain in right hip  Chronic pain of left knee  Muscle weakness (generalized)  Other symptoms and signs involving the musculoskeletal system     Problem List Patient Active Problem List   Diagnosis Date Noted  . Memory impairment 12/06/2019  . Trigger finger 08/07/2019  . Primary  insomnia 03/23/2018  . Long-term use of high-risk medication 02/06/2018  . Calcific Achilles tendinitis of left lower extremity 10/06/2017  . Haglund's deformity of left heel 10/06/2017  . Essential hypertension 02/01/2017  . Rheumatoid arthritis involving multiple sites (HCC) 02/01/2017  . Diabetes mellitus without complication (HCC) 02/01/2017  . History of colon polyps 02/01/2017  . Depression, recurrent (  HCC) 02/01/2017  . Hyperlipidemia 02/01/2017    Lerry Liner PT, DPT, PN1   Supplemental Physical Therapist [facility]    Pager [phone] Acute Rehab Office [phone]   [facility] [place] [address] [place] [address] [place], [place], [address] Phone: [phone]   Fax:  [phone]  Name: Ann Torres MRN: [id number] Date of Birth: Jul 20, 1955

## 2020-04-11 ENCOUNTER — Encounter: Payer: Self-pay | Admitting: Physical Therapy

## 2020-04-11 ENCOUNTER — Other Ambulatory Visit: Payer: Self-pay

## 2020-04-11 ENCOUNTER — Ambulatory Visit (INDEPENDENT_AMBULATORY_CARE_PROVIDER_SITE_OTHER): Payer: Medicare Other | Admitting: Physical Therapy

## 2020-04-11 DIAGNOSIS — M25551 Pain in right hip: Secondary | ICD-10-CM | POA: Diagnosis not present

## 2020-04-11 DIAGNOSIS — R29898 Other symptoms and signs involving the musculoskeletal system: Secondary | ICD-10-CM

## 2020-04-11 DIAGNOSIS — M25562 Pain in left knee: Secondary | ICD-10-CM | POA: Diagnosis not present

## 2020-04-11 DIAGNOSIS — M25552 Pain in left hip: Secondary | ICD-10-CM | POA: Diagnosis not present

## 2020-04-11 DIAGNOSIS — M6281 Muscle weakness (generalized): Secondary | ICD-10-CM

## 2020-04-11 DIAGNOSIS — G8929 Other chronic pain: Secondary | ICD-10-CM

## 2020-04-11 NOTE — Therapy (Signed)
[place], [place], [address] Phone: [phone]   Fax:  [phone]  Physical Therapy Treatment  Patient Details  Name: Ann Torres MRN: [id number] Date of Birth: [birth year] Referring Provider (PT): Dr Madilyn Fireman    Encounter Date: 04/11/2020   PT End of Session - 04/11/20 0937    Visit Number 4    Number of Visits 12    Date for PT Re-Evaluation 04/28/20    PT Start Time 0935    PT Stop Time 1015    PT Time Calculation (min) 40 min    Activity Tolerance Patient tolerated treatment well;No increased pain    Behavior During Therapy [facility] for tasks assessed/performed           History reviewed. No pertinent past medical history.  Past Surgical History:  Procedure Laterality Date  . BILATERAL OOPHORECTOMY    . BREAST BIOPSY     in pts 30s  . BREAST CYST EXCISION    . CESAREAN SECTION     x 3  . CHOLECYSTECTOMY    . TOTAL ABDOMINAL HYSTERECTOMY    . TUBAL LIGATION      There were no vitals filed for this visit.   Subjective Assessment - 04/11/20 0938    Subjective "I was really sore with the soft tissue work the other day.  But today I'm feeling better".  She reports the heel lift has been amazing; no longer has pain in Rt hip with weight bearing.    Patient Stated Goals get rid of some of the pain without narcotics    Currently in Pain? Yes    Pain Score 5     Pain Location Hip    Pain Orientation Right    Pain Descriptors / Indicators Aching;Sore    Aggravating Factors  sitting    Pain Relieving Factors heating pad; TENS              [facility] PT Assessment - 04/11/20 0001      Assessment   Medical Diagnosis Bilat hip pain; Lt knee pain     Referring Provider (PT) Dr Madilyn Fireman     Onset Date/Surgical Date 03/03/20    Hand Dominance Left    Next MD Visit PRN    Prior Therapy chiropractor in her 12's for sciatica             [facility] Adult PT Treatment/Exercise - 04/11/20  0001      Lumbar Exercises: Stretches   Passive Hamstring Stretch Right;Left;2 reps;20 seconds   seated   Hip Flexor Stretch Right;Left;2 reps;20 seconds   seated    Quad Stretch Right;Left;2 reps;20 seconds   seated   Piriformis Stretch Right;Left;2 reps;30 seconds   seated     Lumbar Exercises: Aerobic   Nustep L4: 5 min for warm up.    Other Aerobic Exercise single laps around gym to assess response to STM and exercises.      Lumbar Exercises: Seated   Sit to Stand 5 reps   eccentric lowering, no UE assist   Sit to Stand Limitations pain in Rt glute 3/4 of the way to chair.      Manual Therapy   Soft tissue mobilization STM and TPR to Rt deep hip rotators, with active IR/ER of hip to decrease tightness and improve mobility.                       PT Long Term Goals -  04/11/20 1631      PT LONG TERM GOAL #1   Title Decrease pain by 50-75% with functional activities including sit to stand; standing; walking; etc    Time 6    Period Weeks    Status Partially Met      PT LONG TERM GOAL #2   Title Increase LE strength to 4+/5 to 5/5 throughout    Time 6    Period Weeks    Status On-going      PT LONG TERM GOAL #3   Title Patient to increase walking tolerance to 20 min with minimal to no increase in pain    Time 6    Period Weeks    Status Achieved      PT LONG TERM GOAL #4   Title Independent in HEP    Time 6    Period Weeks    Status On-going      PT LONG TERM GOAL #5   Title Improve FOTO to </= 46% limitation    Time 6    Period Weeks    Status On-going                 Plan - 04/11/20 0956    Clinical Impression Statement Positive response to heel lift and STM to Rt hip. Palpable tightness in Rt hip internal and external rotators; improved with TPR to area.  Pt no longer has pain with walking, only sitting.  Pain in post right hip on descending from stand to sit; improved after STM.  Pt has partially met her goals.    Personal Factors and  Comorbidities Time since onset of injury/illness/exacerbation;Past/Current Experience;Comorbidity 2    Examination-Activity Limitations Squat;Bed Mobility;Stairs;Bend;Locomotion Level;Stand;Caring for Others;Carry;Sit;Sleep    Examination-Participation Restrictions Cleaning;Community Activity;Interpersonal Relationship;Yard Work;Driving    Stability/Clinical Decision Making Stable/Uncomplicated    Rehab Potential Good    PT Frequency 2x / week    PT Duration 6 weeks    PT Treatment/Interventions ADLs/Self Care Home Management;Aquatic Therapy;Cryotherapy;Electrical Stimulation;Iontophoresis 4mg/ml Dexamethasone;Moist Heat;Ultrasound;Gait training;Stair training;Functional mobility training;Therapeutic activities;Therapeutic exercise;Balance training;Neuromuscular re-education;Patient/family education;Manual techniques;Passive range of motion;Dry needling;Taping    PT Next Visit Plan dry needling if able.Continue with core strength and manual therapy to hips.    PT Home Exercise Plan [email or web address]    Consulted and Agree with Plan of Care Patient           Patient will benefit from skilled therapeutic intervention in order to improve the following deficits and impairments:  Abnormal gait,Decreased mobility,Hypomobility,Difficulty walking,Increased muscle spasms,Improper body mechanics,Decreased activity tolerance,Increased fascial restricitons,Impaired flexibility,Postural dysfunction,Pain  Visit Diagnosis: Pain in right hip  Pain in left hip  Chronic pain of left knee  Muscle weakness (generalized)  Other symptoms and signs involving the musculoskeletal system     Problem List Patient Active Problem List   Diagnosis Date Noted  . Memory impairment 12/06/2019  . Trigger finger 08/07/2019  . Primary insomnia 03/23/2018  . Long-term use of high-risk medication 02/06/2018  . Calcific Achilles tendinitis of left lower extremity 10/06/2017  . Haglund's deformity of left heel 10/06/2017  .  Essential hypertension 02/01/2017  . Rheumatoid arthritis involving multiple sites (HCC) 02/01/2017  . Diabetes mellitus without complication (HCC) 02/01/2017  . History of colon polyps 02/01/2017  . Depression, recurrent (HCC) 02/01/2017  . Hyperlipidemia 02/01/2017   Jennifer Carlson-Long, PTA 04/11/20 4:37 PM  [facility] [address] [place], [place], [address] Phone: [phone]   Fax:  [phone]  Name: Sanjana   D [place] MRN: [id number] Date of Birth: 01/19/1956   

## 2020-04-11 NOTE — Patient Instructions (Signed)
Access Code: [email or web address] URL: [email or web address] Date: 12/10/2021Prepared by: [facility] [place]  Hooklying Hamstring Stretch with Strap - 2 x daily - 7 x weekly - 1 sets - 3 reps - 30 sec hold  Seated Table Piriformis Stretch - 2 x daily - 7 x weekly - 1 sets - 2-3 reps - 20-30 sec hold  Seated Transversus Abdominis Bracing - 2 x daily - 7 x weekly - 1 sets - 5 reps - 10 hold  Seated Piriformis Stretch - 2 x daily - 7 x weekly - 1 sets - 2-3 reps - 20- 30 seconds hold  Seated Hamstring Stretch - 2 x daily - 7 x weekly - 1 sets - 2-3 reps - 20-30 seconds hold

## 2020-04-16 ENCOUNTER — Encounter: Payer: Self-pay | Admitting: Physical Therapy

## 2020-04-16 ENCOUNTER — Other Ambulatory Visit: Payer: Self-pay

## 2020-04-16 ENCOUNTER — Ambulatory Visit (INDEPENDENT_AMBULATORY_CARE_PROVIDER_SITE_OTHER): Payer: Medicare Other | Admitting: Physical Therapy

## 2020-04-16 DIAGNOSIS — G8929 Other chronic pain: Secondary | ICD-10-CM

## 2020-04-16 DIAGNOSIS — R29898 Other symptoms and signs involving the musculoskeletal system: Secondary | ICD-10-CM

## 2020-04-16 DIAGNOSIS — M6281 Muscle weakness (generalized): Secondary | ICD-10-CM

## 2020-04-16 DIAGNOSIS — M25551 Pain in right hip: Secondary | ICD-10-CM | POA: Diagnosis not present

## 2020-04-16 DIAGNOSIS — M25552 Pain in left hip: Secondary | ICD-10-CM | POA: Diagnosis not present

## 2020-04-16 DIAGNOSIS — M25562 Pain in left knee: Secondary | ICD-10-CM | POA: Diagnosis not present

## 2020-04-16 NOTE — Patient Instructions (Signed)
Access Code: [email or web address] URL: [email or web address] Date: 04/16/2020 Prepared by: [facility]  Exercises Seated Transversus Abdominis Bracing - 2 x daily - 7 x weekly - 1 sets - 5 reps - 10 hold Seated Piriformis Stretch - 2 x daily - 7 x weekly - 1 sets - 2-3 reps - 20- 30 seconds hold Seated Hamstring Stretch - 2 x daily - 7 x weekly - 1 sets - 2-3 reps - 20-30 seconds hold Seated Hip Flexor Stretch - 1 x daily - 7 x weekly - 1 sets - 2-3 reps - 20-30 seconds hold Clamshell - 1 x daily - 7 x weekly - 3 sets - 10 reps

## 2020-04-16 NOTE — Therapy (Signed)
[place], [place], [address] Phone: [phone]   Fax:  [phone]  Physical Therapy Treatment  Patient Details  Name: Ann Torres MRN: [id number] Date of Birth: [birth year] Referring Provider (PT): Dr Madilyn Fireman    Encounter Date: 04/16/2020   PT End of Session - 04/16/20 1102    Visit Number 5    Number of Visits 12    Date for PT Re-Evaluation 04/28/20    PT Start Time 9794    PT Stop Time 1055    PT Time Calculation (min) 40 min    Activity Tolerance Patient tolerated treatment well    Behavior During Therapy [facility] for tasks assessed/performed           History reviewed. No pertinent past medical history.  Past Surgical History:  Procedure Laterality Date   BILATERAL OOPHORECTOMY     BREAST BIOPSY     in pts 30s   BREAST CYST EXCISION     CESAREAN SECTION     x 3   CHOLECYSTECTOMY     TOTAL ABDOMINAL HYSTERECTOMY     TUBAL LIGATION      There were no vitals filed for this visit.   Subjective Assessment - 04/16/20 1021    Subjective "the soft tissue work was wonderful, I don't feel pain in my hip when I walk".  Pt reports she has been performing HEP and using TENs unit    Pertinent History Rheumatoid arthritis; HTN; HOH; C-section; hysterectomy; gall bladder removal 1992    Patient Stated Goals get rid of some of the pain without narcotics    Currently in Pain? Yes    Pain Score 5     Pain Location Hip    Pain Orientation Right    Pain Descriptors / Indicators Aching    Pain Type Chronic pain    Pain Onset More than a month ago    Aggravating Factors  sitting    Pain Relieving Factors TENS, heat                             [facility] Adult PT Treatment/Exercise - 04/16/20 0001      Lumbar Exercises: Stretches   Passive Hamstring Stretch Right;Left;2 reps;30 seconds   seated   Hip Flexor Stretch 2 reps;20 seconds;Left;Right    Piriformis Stretch 2  reps;Left;Right;30 seconds   seated     Lumbar Exercises: Aerobic   Nustep L4 x 5 min      Lumbar Exercises: Seated   Other Seated Lumbar Exercises TA contractions 5 x 10 seconds      Lumbar Exercises: Sidelying   Clam Right;20 reps      Manual Therapy   Manual therapy comments skilled palpation to assess response to dry needling    Soft tissue mobilization STM and TPR to Rt piriformis with passive ER and IR            Trigger Point Dry Needling - 04/16/20 0001    Consent Given? Yes    Education Handout Provided --   provided verbal education   Muscles Treated Back/Hip Piriformis    Piriformis Response Twitch response elicited                PT Education - 04/16/20 1100    Education Details dry needling education    Person(s) Educated Patient    Methods Explanation;Demonstration    Comprehension Verbalized understanding  PT Long Term Goals - 04/16/20 1104      PT LONG TERM GOAL #1   Title Decrease pain by 50-75% with functional activities including sit to stand; standing; walking; etc    Status Partially Met      PT LONG TERM GOAL #2   Title Increase LE strength to 4+/5 to 5/5 throughout    Status On-going      PT LONG TERM GOAL #3   Title Patient to increase walking tolerance to 20 min with minimal to no increase in pain    Status Achieved      PT LONG TERM GOAL #4   Title Independent in HEP    Status On-going      PT LONG TERM GOAL #5   Title Improve FOTO to </= 46% limitation    Status On-going                 Plan - 04/16/20 1024    Clinical Impression Statement Pt with good response to manual STM and TPR. Pt continues with pain only with sitting. Able to tolerate addition of clam shell exercises today    Personal Factors and Comorbidities Time since onset of injury/illness/exacerbation;Past/Current Experience;Comorbidity 2    Examination-Activity Limitations Squat;Bed Mobility;Stairs;Bend;Locomotion Level;Stand;Caring  for Others;Carry;Sit;Sleep    Examination-Participation Restrictions Cleaning;Community Activity;Interpersonal Relationship;Yard Work;Driving    Rehab Potential Good    PT Frequency 2x / week    PT Duration 6 weeks    PT Treatment/Interventions ADLs/Self Care Home Management;Aquatic Therapy;Cryotherapy;Electrical Stimulation;Iontophoresis 49m/ml Dexamethasone;Moist Heat;Ultrasound;Gait training;Stair training;Functional mobility training;Therapeutic activities;Therapeutic exercise;Balance training;Neuromuscular re-education;Patient/family education;Manual techniques;Passive range of motion;Dry needling;Taping    PT Next Visit Plan assess dry needling. Progress core and hip strength    PT Home Exercise Plan [id number]    Consulted and Agree with Plan of Care Patient           Patient will benefit from skilled therapeutic intervention in order to improve the following deficits and impairments:  Abnormal gait,Decreased mobility,Hypomobility,Difficulty walking,Increased muscle spasms,Improper body mechanics,Decreased activity tolerance,Increased fascial restricitons,Impaired flexibility,Postural dysfunction,Pain  Visit Diagnosis: Pain in right hip  Muscle weakness (generalized)  Pain in left hip  Chronic pain of left knee  Other symptoms and signs involving the musculoskeletal system     Problem List Patient Active Problem List   Diagnosis Date Noted   Memory impairment 12/06/2019   Trigger finger 08/07/2019   Primary insomnia 03/23/2018   Long-term use of high-risk medication 02/06/2018   Calcific Achilles tendinitis of left lower extremity 10/06/2017   Haglund's deformity of left heel 10/06/2017   Essential hypertension 02/01/2017   Rheumatoid arthritis involving multiple sites ([facility] 02/01/2017   Diabetes mellitus without complication (HSan Miguel 153/29/9242  History of colon polyps 02/01/2017   Depression, recurrent ([place] 02/01/2017   Hyperlipidemia 02/01/2017    Jetta Murray, PT  Carolan Avedisian 04/16/2020, 11:05 AM  [facility]1[place]SMullinKArial N[place]aska [address]Phone: [phone]  Fax:  [phone] Name: LMEAGHAN WHISTLERMRN: [id number] of Birth: 607-28-1957

## 2020-04-18 ENCOUNTER — Encounter: Payer: Medicare Other | Admitting: Physical Therapy

## 2020-04-23 ENCOUNTER — Ambulatory Visit (INDEPENDENT_AMBULATORY_CARE_PROVIDER_SITE_OTHER): Payer: Medicare Other | Admitting: Physical Therapy

## 2020-04-23 ENCOUNTER — Other Ambulatory Visit: Payer: Self-pay

## 2020-04-23 ENCOUNTER — Encounter: Payer: Self-pay | Admitting: Physical Therapy

## 2020-04-23 DIAGNOSIS — M25562 Pain in left knee: Secondary | ICD-10-CM | POA: Diagnosis not present

## 2020-04-23 DIAGNOSIS — M25552 Pain in left hip: Secondary | ICD-10-CM | POA: Diagnosis not present

## 2020-04-23 DIAGNOSIS — M6281 Muscle weakness (generalized): Secondary | ICD-10-CM

## 2020-04-23 DIAGNOSIS — M25551 Pain in right hip: Secondary | ICD-10-CM | POA: Diagnosis not present

## 2020-04-23 DIAGNOSIS — R29898 Other symptoms and signs involving the musculoskeletal system: Secondary | ICD-10-CM | POA: Diagnosis not present

## 2020-04-23 DIAGNOSIS — G8929 Other chronic pain: Secondary | ICD-10-CM

## 2020-04-23 NOTE — Therapy (Signed)
[place], [place], [address] Phone: [phone]   Fax:  [phone]  Physical Therapy Treatment  Patient Details  Name: Ann Torres MRN: [id number] Date of Birth: [birth year] Referring Provider (PT): Dr Madilyn Fireman    Encounter Date: 04/23/2020   PT End of Session - 04/23/20 0754    Visit Number 6    Number of Visits 12    Date for PT Re-Evaluation 04/28/20    PT Start Time 0715    PT Stop Time 0754    PT Time Calculation (min) 39 min    Activity Tolerance Patient tolerated treatment well    Behavior During Therapy [facility] for tasks assessed/performed           History reviewed. No pertinent past medical history.  Past Surgical History:  Procedure Laterality Date   BILATERAL OOPHORECTOMY     BREAST BIOPSY     in pts 30s   BREAST CYST EXCISION     CESAREAN SECTION     x 3   CHOLECYSTECTOMY     TOTAL ABDOMINAL HYSTERECTOMY     TUBAL LIGATION      There were no vitals filed for this visit.   Subjective Assessment - 04/23/20 0718    Subjective Pt reports she has been having increased pain for the past few days in her Rt hip.    Pertinent History Rheumatoid arthritis; HTN; HOH; C-section; hysterectomy; gall bladder removal 1992    Patient Stated Goals get rid of some of the pain without narcotics    Currently in Pain? Yes    Pain Score 7     Pain Location Hip    Pain Orientation Right    Pain Descriptors / Indicators Aching    Pain Type Chronic pain                             [facility] Adult PT Treatment/Exercise - 04/23/20 0001      Lumbar Exercises: Stretches   Passive Hamstring Stretch Right;Left;2 reps;30 seconds    Hip Flexor Stretch Right;2 reps;30 seconds    Quad Stretch Right;Left;2 reps;30 seconds      Lumbar Exercises: Aerobic   Nustep L4 x 5 min      Lumbar Exercises: Supine   Pelvic Tilt 10 reps    Bridge with Ball Squeeze 10 reps    Other  Supine Lumbar Exercises TA contraction x 10 with 5 sec hold    Other Supine Lumbar Exercises adduction ball squeeze 10x with 3 sec hold      Manual Therapy   Manual Therapy Joint mobilization    Joint Mobilization grade 2-3 mobs to SI joint to improve mobility and reduce pain    Soft tissue mobilization STM and TPR to Rt glut med and piriformis                       PT Long Term Goals - 04/16/20 1104      PT LONG TERM GOAL #1   Title Decrease pain by 50-75% with functional activities including sit to stand; standing; walking; etc    Status Partially Met      PT LONG TERM GOAL #2   Title Increase LE strength to 4+/5 to 5/5 throughout    Status On-going      PT LONG TERM GOAL #3   Title Patient to increase walking tolerance to 20 min  with minimal to no increase in pain    Status Achieved      PT LONG TERM GOAL #4   Title Independent in HEP    Status On-going      PT LONG TERM GOAL #5   Title Improve FOTO to </= 46% limitation    Status On-going                 Plan - 04/23/20 0754    Clinical Impression Statement Session modified due to increased pain today. Focus on manual and core strengthening exercises. Added jt mobs to SI to improve mobility and reduce pain    Personal Factors and Comorbidities Time since onset of injury/illness/exacerbation;Past/Current Experience;Comorbidity 2    Examination-Activity Limitations Squat;Bed Mobility;Stairs;Bend;Locomotion Level;Stand;Caring for Others;Carry;Sit;Sleep    Examination-Participation Restrictions Cleaning;Community Activity;Interpersonal Relationship;Yard Work;Driving    Rehab Potential Good    PT Frequency 2x / week    PT Duration 6 weeks    PT Treatment/Interventions ADLs/Self Care Home Management;Aquatic Therapy;Cryotherapy;Electrical Stimulation;Iontophoresis 29m/ml Dexamethasone;Moist Heat;Ultrasound;Gait training;Stair training;Functional mobility training;Therapeutic activities;Therapeutic  exercise;Balance training;Neuromuscular re-education;Patient/family education;Manual techniques;Passive range of motion;Dry needling;Taping    PT Next Visit Plan Progress core strength, assess SI jt mobility    PT Home Exercise Plan [id number]    Consulted and Agree with Plan of Care Patient           Patient will benefit from skilled therapeutic intervention in order to improve the following deficits and impairments:  Abnormal gait,Decreased mobility,Hypomobility,Difficulty walking,Increased muscle spasms,Improper body mechanics,Decreased activity tolerance,Increased fascial restricitons,Impaired flexibility,Postural dysfunction,Pain  Visit Diagnosis: Pain in right hip  Muscle weakness (generalized)  Pain in left hip  Chronic pain of left knee  Other symptoms and signs involving the musculoskeletal system     Problem List Patient Active Problem List   Diagnosis Date Noted   Memory impairment 12/06/2019   Trigger finger 08/07/2019   Primary insomnia 03/23/2018   Long-term use of high-risk medication 02/06/2018   Calcific Achilles tendinitis of left lower extremity 10/06/2017   Haglund's deformity of left heel 10/06/2017   Essential hypertension 02/01/2017   Rheumatoid arthritis involving multiple sites (HL[place] 02/01/2017   Diabetes mellitus without complication (H[place] 101/01/3234  History of colon polyps 02/01/2017   Depression, recurrent (HDante 02/01/2017   Hyperlipidemia 02/01/2017   Rosealee Recinos, PT  Terryl Niziolek 04/23/2020, 7:56 AM  [facility]SCarson CityKLyman [place] [address]Phone: [phone]  Fax:  [phone] Name: Ann Torres: [id number] of Birth: 623-Mar-1957

## 2020-04-25 ENCOUNTER — Other Ambulatory Visit: Payer: Self-pay

## 2020-04-25 ENCOUNTER — Encounter: Payer: Self-pay | Admitting: Family Medicine

## 2020-04-25 ENCOUNTER — Emergency Department (INDEPENDENT_AMBULATORY_CARE_PROVIDER_SITE_OTHER)
Admission: EM | Admit: 2020-04-25 | Discharge: 2020-04-25 | Disposition: A | Discharge status: Home / Self Care | Payer: Medicare Other | Source: Home / Self Care

## 2020-04-25 DIAGNOSIS — R059 Cough, unspecified: Secondary | ICD-10-CM | POA: Diagnosis not present

## 2020-04-25 DIAGNOSIS — J069 Acute upper respiratory infection, unspecified: Secondary | ICD-10-CM

## 2020-04-25 MED ORDER — OSELTAMIVIR PHOSPHATE 75 MG PO CAPS: 75.0000 mg | ORAL_CAPSULE | Freq: Two times a day (BID) | ORAL | 0 refills | Status: DC

## 2020-04-25 MED ORDER — HYDROCODONE-HOMATROPINE 5-1.5 MG/5ML PO SYRP
5.0000 mL | ORAL_SOLUTION | Freq: Four times a day (QID) | ORAL | 0 refills | Status: DC | PRN
Start: 2020-04-25 — End: 2020-08-01

## 2020-04-25 NOTE — ED Provider Notes (Signed)
Ann Torres CARE    CSN: [id number] Arrival date & time: 04/25/20  0855      History   Chief Complaint Chief Complaint  Patient presents with  . Cough    HPI [name] is a 64 y.o. female.   This is the initial [place] urgent care visit for this female who is 64 years old.  She presents with symptoms of the flu.  She has had a flu vaccine.  Symptoms began gradually on Wednesday with some nausea, myalgia, and fatigue.  She has a mild sore throat and cough.  She felt some wheezing last night but it seems to have cleared now.  Patient is on immunosuppressants for rheumatoid arthritis  Flu exposure - both grandkids have it C/o cough & wheezing  Started on wed night  - no OTC meds [name] , [name] booster  Flu vaccine in Oct  Patient is a retired Energy manager.  She had to retire after developing hearing loss from her rheumatoid arthritis medication.  She is from [place]     History reviewed. No pertinent past medical history.  Patient Active Problem List   Diagnosis Date Noted  . Memory impairment 12/06/2019  . Trigger finger 08/07/2019  . Primary insomnia 03/23/2018  . Long-term use of high-risk medication 02/06/2018  . Calcific Achilles tendinitis of left lower extremity 10/06/2017  . Haglund's deformity of left heel 10/06/2017  . Essential hypertension 02/01/2017  . Rheumatoid arthritis involving multiple sites (HCC) 02/01/2017  . Diabetes mellitus without complication (HCC) 02/01/2017  . History of colon polyps 02/01/2017  . Depression, recurrent (HCC) 02/01/2017  . Hyperlipidemia 02/01/2017    Past Surgical History:  Procedure Laterality Date  . BILATERAL OOPHORECTOMY    . BREAST BIOPSY     in pts 30s  . BREAST CYST EXCISION    . CESAREAN SECTION     x 3  . CHOLECYSTECTOMY    . TOTAL ABDOMINAL HYSTERECTOMY    . TUBAL LIGATION      OB History   No obstetric history on file.      Home Medications     Prior to Admission medications   Medication Sig Start Date End Date Taking? Authorizing Provider  Abatacept (ORENCIA CLICKJECT) 125 MG/ML SOAJ Inject into the skin. 01/12/18   [provider]  AMBULATORY NON FORMULARY MEDICATION Medication Name: Shingrix IM x1.  Then repeat in 2-6 months. 03/06/20   [name], MD  atorvastatin (LIPITOR) 20 MG tablet Take 1 tablet (20 mg total) by mouth at bedtime. 12/06/19   [name], MD  citalopram (CELEXA) 40 MG tablet Take 1 tablet (40 mg total) by mouth daily. 12/06/19   [name], MD  Dulaglutide (TRULICITY) 1.5 MG/0.5ML SOPN INJECT 1.5 MG UNDER THE SKIN EVERY 7 DAYS. 12/06/19   [name], MD  HYDROcodone-homatropine (HYDROMET) 5-1.5 MG/5ML syrup Take 5 mLs by mouth every 6 (six) hours as needed for cough. 04/25/20   Elvina Sidle, MD  Insulin Lispro Prot & Lispro (HUMALOG 75/25 MIX) (75-25) 100 UNIT/ML Kwikpen Inject 15 units under skin twice a day, before breakfast and supper 10/19/19   [name], MD  Insulin Pen Needle (EASY TOUCH PEN NEEDLES) 31G X 6 MM MISC Use to inject insulin. 05/08/19   [name], MD  omeprazole (PRILOSEC) 20 MG capsule Take 1 capsule (20 mg total) by mouth daily. 12/06/19   [name], MD  oseltamivir (TAMIFLU) 75 MG  capsule Take 1 capsule (75 mg total) by mouth every 12 (twelve) hours. 04/25/20   Elvina Sidle, MD  telmisartan (MICARDIS) 80 MG tablet Take 1 tablet (80 mg total) by mouth daily. 12/06/19   [name], MD  zolpidem (AMBIEN) 5 MG tablet Take 0.5-1 tablets (2.5-5 mg total) by mouth at bedtime as needed for sleep. 12/06/19   [name], MD    Family History Family History  Problem Relation Age of Onset  . Colon cancer Mother   . Heart attack Father 82    Social History Social History   Tobacco Use  . Smoking status: Former Smoker    Types: Cigarettes    Quit date: 08/01/2017    Years since quitting: 2.7   . Smokeless tobacco: Never Used  Substance Use Topics  . Alcohol use: No  . Drug use: No     Allergies   Lisinopril, Azathioprine, Celecoxib, Metformin and related, Red dye, Enbrel [etanercept], Farxiga [dapagliflozin], Hydroxychloroquine, Methotrexate derivatives, and Tape   Review of Systems Review of Systems  Constitutional: Positive for fatigue and fever.  HENT: Positive for sore throat.   Respiratory: Positive for cough and wheezing.   Gastrointestinal: Positive for nausea.     Physical Exam Triage Vital Signs ED Triage Vitals  Enc Vitals Group     BP      Pulse      Resp      Temp      Temp src      SpO2      Weight      Height      Head Circumference      Peak Flow      Pain Score      Pain Loc      Pain Edu?      Excl. in GC?    No data found.  Updated Vital Signs BP 138/90 (BP Location: Right Arm)   Pulse 95   Temp 99.4 F (37.4 C) (Oral)   Resp 17   Ht 5\' 4"  (1.626 m)   Wt 74.4 kg   SpO2 99%   BMI 28.15 kg/m    Physical Exam Vitals and nursing note reviewed.  Constitutional:      Appearance: Normal appearance. She is normal weight.  HENT:     Right Ear: Tympanic membrane normal.     Left Ear: Tympanic membrane normal.     Ears:     Comments: Wearing hearing aids    Mouth/Throat:     Mouth: Mucous membranes are moist.     Pharynx: Oropharynx is clear.  Eyes:     Conjunctiva/sclera: Conjunctivae normal.  Cardiovascular:     Rate and Rhythm: Normal rate and regular rhythm.     Pulses: Normal pulses.     Heart sounds: Normal heart sounds.  Pulmonary:     Effort: Pulmonary effort is normal.     Breath sounds: Normal breath sounds.  Musculoskeletal:     Cervical back: Normal range of motion and neck supple.  Skin:    General: Skin is warm and dry.  Neurological:     General: No focal deficit present.     Mental Status: She is alert.  Psychiatric:        Mood and Affect: Mood normal.      UC Treatments / Results  Labs (all  labs ordered are listed, but only abnormal results are displayed) Labs Reviewed  COVID-19, FLU A+B AND RSV    EKG  Radiology No results found.  Procedures Procedures (including critical care time)  Medications Ordered in UC Medications - No data to display  Initial Impression / Assessment and Plan / UC Course  I have reviewed the triage vital signs and the nursing notes.  Pertinent labs & imaging results that were available during my care of the patient were reviewed by me and considered in my medical decision making (see chart for details).    Final Clinical Impressions(s) / UC Diagnoses   Final diagnoses:  Cough  Viral upper respiratory tract infection   Discharge Instructions   None    ED Prescriptions    Medication Sig Dispense Auth. Provider   oseltamivir (TAMIFLU) 75 MG capsule Take 1 capsule (75 mg total) by mouth every 12 (twelve) hours. 10 capsule Elvina Sidle, MD   HYDROcodone-homatropine (HYDROMET) 5-1.5 MG/5ML syrup Take 5 mLs by mouth every 6 (six) hours as needed for cough. 60 mL Elvina Sidle, MD     I have reviewed the PDMP during this encounter.   Elvina Sidle, MD 04/25/20 [phone]

## 2020-04-25 NOTE — Discharge Instructions (Addendum)
The nasal swab test should be back either Sunday or Monday.  We will call you with positive.

## 2020-04-25 NOTE — ED Triage Notes (Signed)
Flu exposure - both grandkids have it C/o cough & wheezing  Started on wed night  - no OTC meds [name] , [name] booster  Flu vaccine in Oct

## 2020-04-28 ENCOUNTER — Encounter: Payer: Medicare Other | Admitting: Physical Therapy

## 2020-04-30 LAB — COVID-19, FLU A+B AND RSV
Influenza A, NAA: DETECTED — AB
Influenza B, NAA: NOT DETECTED
RSV, NAA: NOT DETECTED
SARS-CoV-2, NAA: NOT DETECTED

## 2020-05-01 ENCOUNTER — Encounter: Payer: Medicare Other | Admitting: Physical Therapy

## 2020-05-01 ENCOUNTER — Telehealth (INDEPENDENT_AMBULATORY_CARE_PROVIDER_SITE_OTHER): Payer: Medicare Other | Admitting: Family Medicine

## 2020-05-01 ENCOUNTER — Encounter: Payer: Self-pay | Admitting: Family Medicine

## 2020-05-01 DIAGNOSIS — J101 Influenza due to other identified influenza virus with other respiratory manifestations: Secondary | ICD-10-CM

## 2020-05-01 DIAGNOSIS — R059 Cough, unspecified: Secondary | ICD-10-CM

## 2020-05-01 MED ORDER — PREDNISONE 20 MG PO TABS: 40.0000 mg | ORAL_TABLET | Freq: Every day | ORAL | 0 refills | Status: DC

## 2020-05-01 MED ORDER — ALBUTEROL SULFATE HFA 108 (90 BASE) MCG/ACT IN AERS: 2.0000 | INHALATION_SPRAY | Freq: Four times a day (QID) | RESPIRATORY_TRACT | 0 refills | Status: DC | PRN

## 2020-05-01 NOTE — Progress Notes (Signed)
Finished tamiflu on Sunday sxs worse wheezing. Feels like its bronchitis and that she needs an inhaler.  Cough has progressed and unable to get the mucus up.   She denies any f/s/c/n/v/d/bod

## 2020-05-01 NOTE — Progress Notes (Signed)
Virtual Visit via Telephone Note  I connected with Ann Torres on 05/01/20 at  4:00 PM EST by telephone and verified that I am speaking with the correct person using two identifiers.   I discussed the limitations of evaluation and management by telemedicine and the availability of in person appointments. The patient expressed understanding and agreed to proceed.  Patient location: at home Provider location: in office  Subjective:    CC: chest congestion.   HPI: 64 year old female with a history of hypertension and diabetes calls today because she still not feeling well.  She was recently seen in the urgent care on December 24 with symptoms of the flu.  She did have a flu vaccine this year.  She complained of nausea myalgias and fatigue, as well as mild sore throat and cough.  She had known exposure to the flu from her grandchildren.  She was treated with Tamiflu and finished that medication on Sunday.  She was also given little to cough syrup.  Feels like she still has a lot of chest congestion and most like bronchitis but feels like she just cannot get the mucus to come up.  She denies any fever, chills, sweats.  No GI symptoms including nausea vomiting diarrhea. + influ A. No SOB.     Past medical history, Surgical history, Family history not pertinant except as noted below, Social history, Allergies, and medications have been entered into the medical record, reviewed, and corrections made.   Review of Systems: No fevers, chills, night sweats, weight loss, chest pain, or shortness of breath.   Objective:    General: Speaking clearly in complete sentences without any shortness of breath.  Alert and oriented x3.  Normal judgment. No apparent acute distress.    Impression and Recommendations:    No problem-specific Assessment & Plan notes found for this encounter.  Influenza a with persistent cough-she says the cough medication is helping some.  She is hearing a lot of expiratory  wheezing.  But no significant shortness of breath.  So we discussed a trial of an albuterol inhaler plus or minus prednisone if she is not responding to the albuterol.  No underlying history of COPD or asthma.  If at any point she feels she is getting worse or if she is just not better after the weekend then please let us know.  Also gave her information for on-call nurse over the weekend in case she feels like she is getting worse.    Time spent in encounter 12 minutes  I discussed the assessment and treatment plan with the patient. The patient was provided an opportunity to ask questions and all were answered. The patient agreed with the plan and demonstrated an understanding of the instructions.   The patient was advised to call back or seek an in-person evaluation if the symptoms worsen or if the condition fails to improve as anticipated.   Nani Gasser, MD

## 2020-05-07 ENCOUNTER — Other Ambulatory Visit: Payer: Self-pay

## 2020-05-07 ENCOUNTER — Ambulatory Visit (INDEPENDENT_AMBULATORY_CARE_PROVIDER_SITE_OTHER): Payer: Medicare Other | Admitting: Physical Therapy

## 2020-05-07 DIAGNOSIS — M25562 Pain in left knee: Secondary | ICD-10-CM

## 2020-05-07 DIAGNOSIS — M25552 Pain in left hip: Secondary | ICD-10-CM

## 2020-05-07 DIAGNOSIS — M25551 Pain in right hip: Secondary | ICD-10-CM

## 2020-05-07 DIAGNOSIS — R29898 Other symptoms and signs involving the musculoskeletal system: Secondary | ICD-10-CM | POA: Diagnosis not present

## 2020-05-07 DIAGNOSIS — G8929 Other chronic pain: Secondary | ICD-10-CM | POA: Diagnosis not present

## 2020-05-07 DIAGNOSIS — M6281 Muscle weakness (generalized): Secondary | ICD-10-CM | POA: Diagnosis not present

## 2020-05-07 NOTE — Therapy (Addendum)
[facility] [place], [place], [address] Phone: [phone]   Fax:  [phone]  Physical Therapy Treatment/Discharge  Patient Details  Name: Ann Torres MRN: [id number] Date of Birth: 1955-06-12 Referring Provider (PT): Dr Madilyn Fireman    Encounter Date: 05/07/2020   PT End of Session - 05/07/20 1429    Visit Number 7    Authorization Type [employer]    PT Start Time 8110    PT Stop Time 1510    PT Time Calculation (min) 41 min    Activity Tolerance Patient tolerated treatment well    Behavior During Therapy [facility] for tasks assessed/performed           No past medical history on file.  Past Surgical History:  Procedure Laterality Date  . BILATERAL OOPHORECTOMY    . BREAST BIOPSY     in pts 30s  . BREAST CYST EXCISION    . CESAREAN SECTION     x 3  . CHOLECYSTECTOMY    . TOTAL ABDOMINAL HYSTERECTOMY    . TUBAL LIGATION      There were no vitals filed for this visit.   Subjective Assessment - 05/07/20 1431    Subjective Pt reports she has been painfree for the last 2 wks, [facility], also walking every day.    Patient Stated Goals get rid of some of the pain without narcotics    Currently in Pain? No/denies              [facility] PT Assessment - 05/07/20 0001      Assessment   Medical Diagnosis Bilat hip pain; Lt knee pain     Referring Provider (PT) Dr Madilyn Fireman       Observation/Other Assessments   Focus on Therapeutic Outcomes (FOTO)  33% limited      AROM   Lumbar Flexion 75% present    Lumbar Extension 75% present    Lumbar - Right Rotation 100% present    Lumbar - Left Rotation 100% present      Strength   Right Hip Flexion 5/5    Right Hip Extension 5/5    Right Hip ABduction 4+/5    Left Hip Flexion 5/5    Left Hip Extension 5/5    Left Hip ABduction 5/5    Right Knee Flexion --   5-/5   Right Knee Extension 5/5    Left Knee Flexion 5/5    Left Knee Extension  5/5      Flexibility   Hamstrings bilat to 90 degrees in supine SLR    Quadriceps prone knee flexion bilat 1" from buttocks                         [facility] Adult PT Treatment/Exercise - 05/07/20 0001      Self-Care   Self-Care Other Self-Care Comments    Other Self-Care Comments  reviewed HEP and progression as well as return to pickle ball and other activities.      Lumbar Exercises: Stretches   Hip Flexor Stretch Right;Left;2 reps;30 seconds    Hip Flexor Stretch Limitations in half kneel    Quad Stretch Left;Right;2 reps;30 seconds   prone with strap   ITB Stretch Left;Right;2 reps;30 seconds    ITB Stretch Limitations cross body with strap      Lumbar Exercises: Aerobic   Nustep L4 x6 min      Lumbar Exercises: Standing  Other Standing Lumbar Exercises braiding side/side, side stepping with blue band, SLS FWD leans with counter for safety.                       PT Long Term Goals - 05/07/20 1432      PT LONG TERM GOAL #1   Title Decrease pain by 50-75% with functional activities including sit to stand; standing; walking; etc    Baseline 100% painfree    Status Achieved      PT LONG TERM GOAL #2   Title Increase LE strength to 4+/5 to 5/5 throughout    Status Achieved      PT LONG TERM GOAL #3   Title Patient to increase walking tolerance to 20 min with minimal to no increase in pain    Baseline 40 min ( 2.5 miles)    Status Achieved      PT LONG TERM GOAL #4   Title Independent in HEP    Status Achieved      PT LONG TERM GOAL #5   Title Improve FOTO to </= 46% limitation    Baseline 33% limited    Status Achieved                 Plan - 05/07/20 1510    Clinical Impression Statement Ann Torres is doing very well with her HEP and returning to her gym activities.  She has met all her goals and is pleased with her progress.  Ready for discharge as her flexibility and strength are WNL and she has not had pain in 2 wks.            Patient will benefit from skilled therapeutic intervention in order to improve the following deficits and impairments:     Visit Diagnosis: Pain in right hip  Muscle weakness (generalized)  Pain in left hip  Chronic pain of left knee  Other symptoms and signs involving the musculoskeletal system     Problem List Patient Active Problem List   Diagnosis Date Noted  . Memory impairment 12/06/2019  . Trigger finger 08/07/2019  . Primary insomnia 03/23/2018  . Long-term use of high-risk medication 02/06/2018  . Calcific Achilles tendinitis of left lower extremity 10/06/2017  . Haglund's deformity of left heel 10/06/2017  . Essential hypertension 02/01/2017  . Rheumatoid arthritis involving multiple sites ([place]) 02/01/2017  . Diabetes mellitus without complication ([place]) 32/95/1884  . History of colon polyps 02/01/2017  . Depression, recurrent ([place]) 02/01/2017  . Hyperlipidemia 02/01/2017    Jeral Pinch PT  05/07/2020, 3:12 PM  [facility] [place], [place], [address] Phone: [phone]   Fax:  [phone]  Name: Ann Torres MRN: [id number] Date of Birth: [birth year]   PHYSICAL THERAPY DISCHARGE SUMMARY  Visits from Start of Care: 7  Current functional level related to goals / functional outcomes: See above, all goals met   Remaining deficits: none   Education / Equipment: HEP Plan: Patient agrees to discharge.  Patient goals were met. Patient is being discharged due to meeting the stated rehab goals.  ?????Jeral Pinch, PT 05/07/20 4:48 PM

## 2020-05-07 NOTE — Patient Instructions (Signed)
Access Code: [email or web address] URL: [email or web address] Date: 05/07/2020 Prepared by: Roderic Scarce  Exercises Seated Piriformis Stretch - 2 x daily - 7 x weekly - 1 sets - 2-3 reps - 20- 30 seconds hold Seated Hamstring Stretch - 2 x daily - 7 x weekly - 1 sets - 2-3 reps - 20-30 seconds hold Seated Hip Flexor Stretch - 1 x daily - 7 x weekly - 1 sets - 2-3 reps - 20-30 seconds hold Forward T - 3-4 x weekly - 2-3 sets - 10 reps Sidestepping in Squat with Resistance and Arms Forward - 3-4 x weekly - 3 sets - 10 reps Braided Sidestepping - 3-4 x weekly - 3 sets - 10 reps

## 2020-05-19 ENCOUNTER — Encounter: Payer: Self-pay | Admitting: Family Medicine

## 2020-05-22 ENCOUNTER — Encounter: Payer: Self-pay | Admitting: Family Medicine

## 2020-05-22 DIAGNOSIS — M19041 Primary osteoarthritis, right hand: Secondary | ICD-10-CM | POA: Diagnosis not present

## 2020-05-22 DIAGNOSIS — M0579 Rheumatoid arthritis with rheumatoid factor of multiple sites without organ or systems involvement: Secondary | ICD-10-CM | POA: Diagnosis not present

## 2020-05-22 DIAGNOSIS — M19042 Primary osteoarthritis, left hand: Secondary | ICD-10-CM | POA: Diagnosis not present

## 2020-05-22 DIAGNOSIS — Z79899 Other long term (current) drug therapy: Secondary | ICD-10-CM | POA: Diagnosis not present

## 2020-05-25 ENCOUNTER — Other Ambulatory Visit: Payer: Self-pay | Admitting: Family Medicine

## 2020-05-28 ENCOUNTER — Other Ambulatory Visit: Payer: Self-pay

## 2020-05-28 MED ORDER — INSULIN LISPRO (1 UNIT DIAL) 100 UNIT/ML (KWIKPEN): PEN_INJECTOR | SUBCUTANEOUS | 0 refills | Status: DC

## 2020-05-28 MED ORDER — INSULIN LISPRO (1 UNIT DIAL) 100 UNIT/ML (KWIKPEN)
PEN_INJECTOR | SUBCUTANEOUS | 0 refills | Status: DC
Start: 2020-05-28 — End: 2021-03-03

## 2020-05-28 NOTE — Telephone Encounter (Signed)
The prescription for the Humalog needs to have a max dose listed per pharmacy.

## 2020-05-29 ENCOUNTER — Other Ambulatory Visit: Payer: Self-pay | Admitting: Family Medicine

## 2020-05-29 DIAGNOSIS — I10 Essential (primary) hypertension: Secondary | ICD-10-CM

## 2020-05-29 DIAGNOSIS — F5101 Primary insomnia: Secondary | ICD-10-CM

## 2020-07-07 ENCOUNTER — Ambulatory Visit: Payer: Medicare Other | Admitting: Family Medicine

## 2020-07-07 DIAGNOSIS — E119 Type 2 diabetes mellitus without complications: Secondary | ICD-10-CM

## 2020-07-07 NOTE — Progress Notes (Deleted)
Established Patient Office Visit  Subjective:  Patient ID: Ann Torres, female    DOB: 03-10-1956  Age: 65 y.o. MRN: [id number]  CC: No chief complaint on file.   HPI KIRSI HUGH presents for   Hypertension- Pt denies chest pain, SOB, dizziness, or heart palpitations.  Taking meds as directed w/o problems.  Denies medication side effects.    Diabetes - no hypoglycemic events. No wounds or sores that are not healing well. No increased thirst or urination. Checking glucose at home. Taking medications as prescribed without any side effects.   No past medical history on file.  Past Surgical History:  Procedure Laterality Date  . BILATERAL OOPHORECTOMY    . BREAST BIOPSY     in pts 30s  . BREAST CYST EXCISION    . CESAREAN SECTION     x 3  . CHOLECYSTECTOMY    . TOTAL ABDOMINAL HYSTERECTOMY    . TUBAL LIGATION      Family History  Problem Relation Age of Onset  . Colon cancer Mother   . Heart attack Father 70    Social History   Socioeconomic History  . Marital status: Divorced    Spouse name: Not on file  . Number of children: Not on file  . Years of education: 2 yr college  . Highest education level: Not on file  Occupational History  . Occupation: retired  Tobacco Use  . Smoking status: Former Smoker    Types: Cigarettes    Quit date: 08/01/2017    Years since quitting: 2.9  . Smokeless tobacco: Never Used  Substance and Sexual Activity  . Alcohol use: No  . Drug use: No  . Sexual activity: Never  Other Topics Concern  . Not on file  Social History Narrative   Looks for 15 minutes daily. Denies significant caffeine intake. Currently divorced and not sexually active. Currently living with her daughter and daughter's family.   Social Determinants of Health   Financial Resource Strain: Not on file  Food Insecurity: Not on file  Transportation Needs: Not on file  Physical Activity: Not on file  Stress: Not on file  Social Connections: Not on file   Intimate Partner Violence: Not on file    Outpatient Medications Prior to Visit  Medication Sig Dispense Refill  . Abatacept (ORENCIA CLICKJECT) 125 MG/ML SOAJ Inject into the skin.    Marland Kitchen albuterol (VENTOLIN HFA) 108 (90 Base) MCG/ACT inhaler Inhale 2 puffs into the lungs every 6 (six) hours as needed for wheezing or shortness of breath. 8 g 0  . AMBULATORY NON FORMULARY MEDICATION Medication Name: Shingrix IM x1.  Then repeat in 2-6 months. 1 Units 0  . atorvastatin (LIPITOR) 20 MG tablet Take 1 tablet (20 mg total) by mouth at bedtime. 90 tablet 3  . citalopram (CELEXA) 40 MG tablet Take 1 tablet (40 mg total) by mouth daily. 90 tablet 1  . Dulaglutide (TRULICITY) 1.5 MG/0.5ML SOPN INJECT 1.5 MG UNDER THE SKIN EVERY 7 DAYS. 6 mL 4  . HYDROcodone-homatropine (HYDROMET) 5-1.5 MG/5ML syrup Take 5 mLs by mouth every 6 (six) hours as needed for cough. 60 mL 0  . insulin lispro (HUMALOG KWIKPEN) 100 UNIT/ML KwikPen Give about 10 minutes before meals per Sliding Scale. Max dose of 30 units per day 15 mL 0  . Insulin Lispro Prot & Lispro (HUMALOG 75/25 MIX) (75-25) 100 UNIT/ML Kwikpen Inject 15 units under skin twice a day, before breakfast and supper 15 mL 3  .  Insulin Pen Needle (EASY TOUCH PEN NEEDLES) 31G X 6 MM MISC Use to inject insulin. 100 each 10  . omeprazole (PRILOSEC) 20 MG capsule Take 1 capsule (20 mg total) by mouth daily. 90 capsule 3  . predniSONE (DELTASONE) 20 MG tablet Take 2 tablets (40 mg total) by mouth daily with breakfast. 10 tablet 0  . telmisartan (MICARDIS) 80 MG tablet Take 1 tablet (80 mg total) by mouth daily. 90 tablet 1  . zolpidem (AMBIEN) 5 MG tablet Take 0.5-1 tablets (2.5-5 mg total) by mouth at bedtime as needed for sleep. 90 tablet 1   No facility-administered medications prior to visit.    Allergies  Allergen Reactions  . Lisinopril Shortness Of Breath  . Azathioprine Nausea And Vomiting  . Celecoxib Other (See Comments)    Patient reported  . Metformin  And Related Other (See Comments)    Diarrhea and vomiting  . Red Dye Other (See Comments)  . Enbrel [Etanercept] Other (See Comments)    ENT felt was causing hearing loss  . Marcelline Deist [Dapagliflozin] Other (See Comments)    Recurrent yeast infections  . Hydroxychloroquine Rash  . Methotrexate Derivatives Rash  . Tape Rash    r    ROS Review of Systems    Objective:    Physical Exam Constitutional:      Appearance: She is well-developed and well-nourished.  HENT:     Head: Normocephalic and atraumatic.  Cardiovascular:     Rate and Rhythm: Normal rate and regular rhythm.     Heart sounds: Normal heart sounds.  Pulmonary:     Effort: Pulmonary effort is normal.     Breath sounds: Normal breath sounds.  Skin:    General: Skin is warm and dry.  Neurological:     Mental Status: She is alert and oriented to person, place, and time.  Psychiatric:        Mood and Affect: Mood and affect normal.        Behavior: Behavior normal.     There were no vitals taken for this visit. Wt Readings from Last 3 Encounters:  04/25/20 164 lb (74.4 kg)  03/06/20 170 lb (77.1 kg)  12/06/19 166 lb (75.3 kg)     Health Maintenance Due  Topic Date Due  . DEXA SCAN  Never done    There are no preventive care reminders to display for this patient.  Lab Results  Component Value Date   TSH 0.86 12/07/2019   Lab Results  Component Value Date   WBC 8.1 12/07/2019   HGB 13.6 12/07/2019   HCT 41.2 12/07/2019   MCV 79.5 (L) 12/07/2019   PLT 228 12/07/2019   Lab Results  Component Value Date   NA 141 12/07/2019   K 4.7 12/07/2019   CO2 25 12/07/2019   GLUCOSE 163 (H) 12/07/2019   BUN 8 12/07/2019   CREATININE 0.66 12/07/2019   BILITOT 0.6 12/07/2019   ALKPHOS 113 08/23/2018   AST 14 12/07/2019   ALT 13 12/07/2019   PROT 6.6 12/07/2019   ALBUMIN 4.6 08/23/2018   CALCIUM 9.3 12/07/2019   Lab Results  Component Value Date   CHOL 139 12/07/2019   Lab Results  Component  Value Date   HDL 54 12/07/2019   Lab Results  Component Value Date   LDLCALC 62 12/07/2019   Lab Results  Component Value Date   TRIG 145 12/07/2019   Lab Results  Component Value Date   CHOLHDL 2.6 12/07/2019  Lab Results  Component Value Date   HGBA1C 6.1 (A) 03/06/2020      Assessment & Plan:   Problem List Items Addressed This Visit      Cardiovascular and Mediastinum   Essential hypertension     Endocrine   Diabetes mellitus without complication (HCC) - Primary      No orders of the defined types were placed in this encounter.   Follow-up: No follow-ups on file.    Nani Gasser, MD

## 2020-07-15 ENCOUNTER — Ambulatory Visit: Payer: Medicare Other | Admitting: Family Medicine

## 2020-07-31 ENCOUNTER — Other Ambulatory Visit: Payer: Self-pay | Admitting: Family Medicine

## 2020-08-04 ENCOUNTER — Other Ambulatory Visit: Payer: Self-pay

## 2020-08-04 ENCOUNTER — Encounter: Payer: Self-pay | Admitting: Family Medicine

## 2020-08-04 ENCOUNTER — Ambulatory Visit (INDEPENDENT_AMBULATORY_CARE_PROVIDER_SITE_OTHER): Payer: Medicare Other | Admitting: Family Medicine

## 2020-08-04 VITALS — BP 128/78 | HR 86 | Ht 64.0 in | Wt 168.0 lb

## 2020-08-04 DIAGNOSIS — Z780 Asymptomatic menopausal state: Secondary | ICD-10-CM

## 2020-08-04 DIAGNOSIS — R0602 Shortness of breath: Secondary | ICD-10-CM | POA: Diagnosis not present

## 2020-08-04 DIAGNOSIS — F339 Major depressive disorder, recurrent, unspecified: Secondary | ICD-10-CM

## 2020-08-04 DIAGNOSIS — I10 Essential (primary) hypertension: Secondary | ICD-10-CM

## 2020-08-04 DIAGNOSIS — Z91030 Bee allergy status: Secondary | ICD-10-CM | POA: Diagnosis not present

## 2020-08-04 DIAGNOSIS — E119 Type 2 diabetes mellitus without complications: Secondary | ICD-10-CM | POA: Diagnosis not present

## 2020-08-04 DIAGNOSIS — M069 Rheumatoid arthritis, unspecified: Secondary | ICD-10-CM | POA: Diagnosis not present

## 2020-08-04 LAB — POCT GLYCOSYLATED HEMOGLOBIN (HGB A1C): Hemoglobin A1C: 6.9 % — AB (ref 4.0–5.6)

## 2020-08-04 MED ORDER — EPINEPHRINE 0.3 MG/0.3ML IJ SOAJ: 0.3000 mg | INTRAMUSCULAR | 1 refills | Status: DC | PRN

## 2020-08-04 MED ORDER — ALBUTEROL SULFATE HFA 108 (90 BASE) MCG/ACT IN AERS: 2.0000 | INHALATION_SPRAY | Freq: Four times a day (QID) | RESPIRATORY_TRACT | 2 refills | Status: DC | PRN

## 2020-08-04 NOTE — Assessment & Plan Note (Signed)
Did refill her albuterol.  I do not see a formal diagnosis on her chart we may need to look into doing spirometry.

## 2020-08-04 NOTE — Assessment & Plan Note (Signed)
Continue current regimen with citalopram.

## 2020-08-04 NOTE — Assessment & Plan Note (Signed)
Discussed that since her last reaction included hives as well as swelling in her throat I think she would benefit from an EpiPen.  Prescription provided.  She can also take 2 Benadryl if needed as well for hives if she gets stung again.  We did discuss that if she uses the EpiPen then she will need to go to the emergency department or urgent care.

## 2020-08-04 NOTE — Assessment & Plan Note (Signed)
A1C of 6.9 today. F/U in 3 months. Increase mix to 19 in AM and 17 in PM.

## 2020-08-04 NOTE — Progress Notes (Signed)
Established Patient Office Visit  Subjective:  Patient ID: Ann Torres, female    DOB: Jul 19, 1955  Age: 65 y.o. MRN: [id number]  CC:  Chief Complaint  Patient presents with  . Diabetes  . Hypertension    HPI Ann Torres presents for   Diabetes - no hypoglycemic events. No wounds or sores that are not healing well. No increased thirst or urination. Checking glucose at home. Taking medications as prescribed without any side effects.  Hypertension- Pt denies chest pain, SOB, dizziness, or heart palpitations.  Taking meds as directed w/o problems.  Denies medication side effects.    Rheumatology put her on a steroid for her RA for about a month and her sugars have been elevated around 250.  She is currently on Orencia.   They are talking about possibly putting her on a low-dose steroid more long-term.  Or maybe even changing her Orencia.  She also wanted to let me know that last year she was stung by bees about 4 times in the very last time that happened at the end of the season she said that she had really large hives and actually felt like her throat was swelling in fact she went to the emergency department.  He has had to use her albuterol more recently and would like a refill.   He also has a knot on her left lower abdomen that became very red and angry and had some initial streaking.  She said it eventually drained on its own a greenish colored fluid and says it has been getting better slowly it just has not completely healed.  It still feels a little bit firm underneath but is much smaller  History reviewed. No pertinent past medical history.  Past Surgical History:  Procedure Laterality Date  . BILATERAL OOPHORECTOMY    . BREAST BIOPSY     in pts 30s  . BREAST CYST EXCISION    . CESAREAN SECTION     x 3  . CHOLECYSTECTOMY    . TOTAL ABDOMINAL HYSTERECTOMY    . TUBAL LIGATION      Family History  Problem Relation Age of Onset  . Colon cancer Mother   . Heart  attack Father 61    Social History   Socioeconomic History  . Marital status: Divorced    Spouse name: Not on file  . Number of children: Not on file  . Years of education: 2 yr college  . Highest education level: Not on file  Occupational History  . Occupation: retired  Tobacco Use  . Smoking status: Former Smoker    Types: Cigarettes    Quit date: 08/01/2017    Years since quitting: 3.0  . Smokeless tobacco: Never Used  Substance and Sexual Activity  . Alcohol use: No  . Drug use: No  . Sexual activity: Never  Other Topics Concern  . Not on file  Social History Narrative   Looks for 15 minutes daily. Denies significant caffeine intake. Currently divorced and not sexually active. Currently living with her daughter and daughter's family.   Social Determinants of Health   Financial Resource Strain: Not on file  Food Insecurity: Not on file  Transportation Needs: Not on file  Physical Activity: Not on file  Stress: Not on file  Social Connections: Not on file  Intimate Partner Violence: Not on file    Outpatient Medications Prior to Visit  Medication Sig Dispense Refill  . Abatacept 125 MG/ML SOAJ Inject into the skin.    Marland Kitchen  AMBULATORY NON FORMULARY MEDICATION Medication Name: Shingrix IM x1.  Then repeat in 2-6 months. 1 Units 0  . atorvastatin (LIPITOR) 20 MG tablet Take 1 tablet (20 mg total) by mouth at bedtime. 90 tablet 3  . citalopram (CELEXA) 40 MG tablet Take 1 tablet (40 mg total) by mouth daily. 90 tablet 1  . Dulaglutide (TRULICITY) 1.5 MG/0.5ML SOPN INJECT 1.5 MG UNDER THE SKIN EVERY 7 DAYS. 6 mL 4  . insulin lispro (HUMALOG KWIKPEN) 100 UNIT/ML KwikPen Give about 10 minutes before meals per Sliding Scale. Max dose of 30 units per day 15 mL 0  . Insulin Lispro Prot & Lispro (HUMALOG 75/25 MIX) (75-25) 100 UNIT/ML Kwikpen Inject 15 units under skin twice a day, before breakfast and supper 15 mL 3  . omeprazole (PRILOSEC) 20 MG capsule Take 1 capsule (20 mg  total) by mouth daily. 90 capsule 3  . telmisartan (MICARDIS) 80 MG tablet Take 1 tablet (80 mg total) by mouth daily. 90 tablet 1  . TRUEPLUS PEN NEEDLES 31G X 6 MM MISC Use to inject insulin. 100 each 10  . zolpidem (AMBIEN) 5 MG tablet Take 0.5-1 tablets (2.5-5 mg total) by mouth at bedtime as needed for sleep. 90 tablet 1  . albuterol (VENTOLIN HFA) 108 (90 Base) MCG/ACT inhaler Inhale 2 puffs into the lungs every 6 (six) hours as needed for wheezing or shortness of breath. 8 g 0  . HYDROcodone-homatropine (HYDROMET) 5-1.5 MG/5ML syrup Take 5 mLs by mouth every 6 (six) hours as needed for cough. 60 mL 0  . predniSONE (DELTASONE) 20 MG tablet Take 2 tablets (40 mg total) by mouth daily with breakfast. 10 tablet 0   No facility-administered medications prior to visit.    Allergies  Allergen Reactions  . Bee Venom Anaphylaxis and Hives  . Lisinopril Shortness Of Breath  . Azathioprine Nausea And Vomiting  . Celecoxib Other (See Comments)    Patient reported  . Metformin And Related Other (See Comments)    Diarrhea and vomiting  . Red Dye Other (See Comments)  . Enbrel [Etanercept] Other (See Comments)    ENT felt was causing hearing loss  . Marcelline Deist [Dapagliflozin] Other (See Comments)    Recurrent yeast infections  . Hydroxychloroquine Rash  . Methotrexate Derivatives Rash  . Tape Rash    r    ROS Review of Systems    Objective:    Physical Exam Constitutional:      Appearance: She is well-developed.  HENT:     Head: Normocephalic and atraumatic.  Cardiovascular:     Rate and Rhythm: Normal rate and regular rhythm.     Heart sounds: Normal heart sounds.  Pulmonary:     Effort: Pulmonary effort is normal.     Breath sounds: Normal breath sounds.  Skin:    General: Skin is warm and dry.  Neurological:     Mental Status: She is alert and oriented to person, place, and time.  Psychiatric:        Behavior: Behavior normal.     BP 128/78   Pulse 86   Ht   (1.626 m)   Wt 168 lb (76.2 kg)   SpO2 100%   BMI 28.84 kg/m  Wt Readings from Last 3 Encounters:  08/04/20 168 lb (76.2 kg)  04/25/20 164 lb (74.4 kg)  03/06/20 170 lb (77.1 kg)     Health Maintenance Due  Topic Date Due  . DEXA SCAN  Never done  There are no preventive care reminders to display for this patient.  Lab Results  Component Value Date   TSH 0.86 12/07/2019   Lab Results  Component Value Date   WBC 8.1 12/07/2019   HGB 13.6 12/07/2019   HCT 41.2 12/07/2019   MCV 79.5 (L) 12/07/2019   PLT 228 12/07/2019   Lab Results  Component Value Date   NA 141 12/07/2019   K 4.7 12/07/2019   CO2 25 12/07/2019   GLUCOSE 163 (H) 12/07/2019   BUN 8 12/07/2019   CREATININE 0.66 12/07/2019   BILITOT 0.6 12/07/2019   ALKPHOS 113 08/23/2018   AST 14 12/07/2019   ALT 13 12/07/2019   PROT 6.6 12/07/2019   ALBUMIN 4.6 08/23/2018   CALCIUM 9.3 12/07/2019   Lab Results  Component Value Date   CHOL 139 12/07/2019   Lab Results  Component Value Date   HDL 54 12/07/2019   Lab Results  Component Value Date   LDLCALC 62 12/07/2019   Lab Results  Component Value Date   TRIG 145 12/07/2019   Lab Results  Component Value Date   CHOLHDL 2.6 12/07/2019   Lab Results  Component Value Date   HGBA1C 6.9 (A) 08/04/2020      Assessment & Plan:   Problem List Items Addressed This Visit      Cardiovascular and Mediastinum   Essential hypertension   Relevant Medications   EPINEPHrine 0.3 mg/0.3 mL IJ SOAJ injection   Other Relevant Orders   BASIC METABOLIC PANEL WITH GFR     Endocrine   Diabetes mellitus without complication (HCC) - Primary    A1C of 6.9 today. F/U in 3 months. Increase mix to 19 in AM and 17 in PM.       Relevant Orders   POCT glycosylated hemoglobin (Hb A1C) (Completed)   BASIC METABOLIC PANEL WITH GFR     Musculoskeletal and Integument   Rheumatoid arthritis involving multiple sites [facility])    Currently on Orencia and considering  low-dose steroids.  But we did discuss that potential impact on her diabetes which has not been nearly well as well controlled.  Will discuss further with her rheumatologist.        Other   SOB (shortness of breath)    Did refill her albuterol.  I do not see a formal diagnosis on her chart we may need to look into doing spirometry.      Depression, recurrent (HCC)    Continue current regimen with citalopram.      Allergy to honey bee venom    Discussed that since her last reaction included hives as well as swelling in her throat I think she would benefit from an EpiPen.  Prescription provided.  She can also take 2 Benadryl if needed as well for hives if she gets stung again.  We did discuss that if she uses the EpiPen then she will need to go to the emergency department or urgent care.      Relevant Medications   EPINEPHrine 0.3 mg/0.3 mL IJ SOAJ injection   Other Relevant Orders   BASIC METABOLIC PANEL WITH GFR    Other Visit Diagnoses    Post-menopausal       Relevant Orders   DG Bone Density      Abscess on left lower abdomen-right now it looks like it is in the healing phase.  No active drainage and no sign of cellulitis at this point.  Meds ordered this encounter  Medications  .  EPINEPHrine 0.3 mg/0.3 mL IJ SOAJ injection    Sig: Inject 0.3 mg into the muscle as needed for anaphylaxis.    Dispense:  1 each    Refill:  1  . albuterol (VENTOLIN HFA) 108 (90 Base) MCG/ACT inhaler    Sig: Inhale 2 puffs into the lungs every 6 (six) hours as needed for wheezing or shortness of breath.    Dispense:  8 g    Refill:  2    Follow-up: Return in about 3 months (around 11/03/2020) for Diabetes follow-up.    Nani Gasser, MD

## 2020-08-04 NOTE — Assessment & Plan Note (Signed)
Currently on Orencia and considering low-dose steroids.  But we did discuss that potential impact on her diabetes which has not been nearly well as well controlled.  Will discuss further with her rheumatologist.

## 2020-08-04 NOTE — Progress Notes (Signed)
Taking 17 u of humalog 75/25

## 2020-08-05 LAB — BASIC METABOLIC PANEL WITHOUT GFR
BUN: 15 mg/dL (ref 7–25)
CO2: 24 mmol/L (ref 20–32)
Calcium: 9.6 mg/dL (ref 8.6–10.4)
Chloride: 104 mmol/L (ref 98–110)
Creat: 0.7 mg/dL (ref 0.50–0.99)
GFR, Est African American: 105 mL/min/{1.73_m2}
GFR, Est Non African American: 91 mL/min/{1.73_m2}
Glucose, Bld: 269 mg/dL — ABNORMAL HIGH (ref 65–139)
Potassium: 4.3 mmol/L (ref 3.5–5.3)
Sodium: 138 mmol/L (ref 135–146)

## 2020-08-20 ENCOUNTER — Encounter: Payer: Self-pay | Admitting: Family Medicine

## 2020-08-20 MED ORDER — INSULIN LISPRO PROT & LISPRO (75-25 MIX) 100 UNIT/ML KWIKPEN: PEN_INJECTOR | SUBCUTANEOUS | 1 refills | Status: DC

## 2020-08-27 ENCOUNTER — Encounter: Payer: Self-pay | Admitting: Family Medicine

## 2020-08-27 ENCOUNTER — Other Ambulatory Visit: Payer: Self-pay

## 2020-08-27 ENCOUNTER — Ambulatory Visit (INDEPENDENT_AMBULATORY_CARE_PROVIDER_SITE_OTHER): Payer: Medicare Other

## 2020-08-27 DIAGNOSIS — Z780 Asymptomatic menopausal state: Secondary | ICD-10-CM

## 2020-08-27 DIAGNOSIS — M8580 Other specified disorders of bone density and structure, unspecified site: Secondary | ICD-10-CM | POA: Insufficient documentation

## 2020-08-27 DIAGNOSIS — M85852 Other specified disorders of bone density and structure, left thigh: Secondary | ICD-10-CM | POA: Diagnosis not present

## 2020-09-15 ENCOUNTER — Telehealth: Payer: Self-pay | Admitting: Family Medicine

## 2020-09-15 NOTE — Chronic Care Management (AMB) (Signed)
  Chronic Care Management   Outreach Note  09/15/2020 Name: Ann Torres MRN: [id number] DOB: 06-18-1955  Referred by: [name], MD Reason for referral : No chief complaint on file.   An unsuccessful telephone outreach was attempted today. The patient was referred to the pharmacist for assistance with care management and care coordination.   Follow Up Plan:   Carmell [place] [employer] Scheduler

## 2020-10-01 ENCOUNTER — Telehealth: Payer: Self-pay | Admitting: Family Medicine

## 2020-10-01 NOTE — Progress Notes (Signed)
  Chronic Care Management   Outreach Note  10/01/2020 Name: Ann Torres MRN: [id number] DOB: [birth year]  Referred by: [name], MD Reason for referral : No chief complaint on file.   A second unsuccessful telephone outreach was attempted today. The patient was referred to pharmacist for assistance with care management and care coordination.  Follow Up Plan:   Carmell [place] Upstream Scheduler

## 2020-10-27 ENCOUNTER — Telehealth: Payer: Self-pay | Admitting: Family Medicine

## 2020-10-27 NOTE — Progress Notes (Signed)
  Chronic Care Management   Outreach Note  10/27/2020 Name: Ann Torres MRN: [id number] DOB: [birth year]  Referred by: [name], MD Reason for referral : No chief complaint on file.   Third unsuccessful telephone outreach was attempted today. The patient was referred to the pharmacist for assistance with care management and care coordination.   Follow Up Plan:   Carmell Austria Upstream Scheduler

## 2020-10-28 ENCOUNTER — Telehealth: Payer: Self-pay | Admitting: Family Medicine

## 2020-10-28 NOTE — Chronic Care Management (AMB) (Signed)
  Chronic Care Management   Note  10/28/2020 Name: Ann Torres MRN: [id number] DOB: 1956-04-10  Ann Torres is a 66 y.o. year old female who is a primary care patient of Linford Arnold, Barbarann Ehlers, MD. I reached out to [name] by phone today in response to a referral sent by [name]'s PCP, [name], MD.   [name] was given information about Chronic Care Management services today including:  CCM service includes personalized support from designated clinical staff supervised by her physician, including individualized plan of care and coordination with other care providers 24/7 contact phone numbers for assistance for urgent and routine care needs. Service will only be billed when office clinical staff spend 20 minutes or more in a month to coordinate care. Only one practitioner may furnish and bill the service in a calendar month. The patient may stop CCM services at any time (effective at the end of the month) by phone call to the office staff.   Patient agreed to services and verbal consent obtained.   Follow up plan:   Carmell Austria Upstream Scheduler

## 2020-10-28 NOTE — Progress Notes (Signed)
  Chronic Care Management   Outreach Note  10/28/2020 Name: SHAHIDA SCHNACKENBERG MRN: [id number] DOB: 05-24-1955  Referred by: [name], MD Reason for referral : No chief complaint on file.   Third unsuccessful telephone outreach was attempted today. The patient was referred to the pharmacist for assistance with care management and care coordination.   Follow Up Plan:   Carmell Austria Upstream Scheduler

## 2020-11-04 ENCOUNTER — Ambulatory Visit: Payer: Medicare Other | Admitting: Family Medicine

## 2020-11-11 ENCOUNTER — Ambulatory Visit: Payer: Medicare Other | Admitting: Family Medicine

## 2020-11-26 ENCOUNTER — Ambulatory Visit (INDEPENDENT_AMBULATORY_CARE_PROVIDER_SITE_OTHER): Payer: Medicare Other | Admitting: Family Medicine

## 2020-11-26 ENCOUNTER — Encounter: Payer: Self-pay | Admitting: Family Medicine

## 2020-11-26 ENCOUNTER — Other Ambulatory Visit: Payer: Self-pay

## 2020-11-26 VITALS — BP 148/68 | HR 76 | Ht 64.0 in | Wt 164.0 lb

## 2020-11-26 DIAGNOSIS — F325 Major depressive disorder, single episode, in full remission: Secondary | ICD-10-CM | POA: Diagnosis not present

## 2020-11-26 DIAGNOSIS — M069 Rheumatoid arthritis, unspecified: Secondary | ICD-10-CM

## 2020-11-26 DIAGNOSIS — E119 Type 2 diabetes mellitus without complications: Secondary | ICD-10-CM

## 2020-11-26 DIAGNOSIS — I10 Essential (primary) hypertension: Secondary | ICD-10-CM

## 2020-11-26 LAB — COMPLETE METABOLIC PANEL WITH GFR
AG Ratio: 1.6 (calc) (ref 1.0–2.5)
ALT: 14 U/L (ref 6–29)
AST: 15 U/L (ref 10–35)
Albumin: 4.3 g/dL (ref 3.6–5.1)
Alkaline phosphatase (APISO): 132 U/L (ref 37–153)
BUN: 15 mg/dL (ref 7–25)
CO2: 26 mmol/L (ref 20–32)
Calcium: 9.6 mg/dL (ref 8.6–10.4)
Chloride: 105 mmol/L (ref 98–110)
Creat: 0.71 mg/dL (ref 0.50–1.05)
Globulin: 2.7 g/dL (calc) (ref 1.9–3.7)
Glucose, Bld: 135 mg/dL — ABNORMAL HIGH (ref 65–99)
Potassium: 4.4 mmol/L (ref 3.5–5.3)
Sodium: 140 mmol/L (ref 135–146)
Total Bilirubin: 0.5 mg/dL (ref 0.2–1.2)
Total Protein: 7 g/dL (ref 6.1–8.1)
eGFR: 94 mL/min/{1.73_m2} (ref >=60)

## 2020-11-26 LAB — POCT GLYCOSYLATED HEMOGLOBIN (HGB A1C): Hemoglobin A1C: 6.2 % — AB (ref 4.0–5.6)

## 2020-11-26 LAB — LIPID PANEL
Cholesterol: 146 mg/dL (ref <200)
HDL: 53 mg/dL (ref >=50)
LDL Cholesterol (Calc): 69 mg/dL (calc)
Non-HDL Cholesterol (Calc): 93 mg/dL (calc) (ref <130)
Total CHOL/HDL Ratio: 2.8 (calc) (ref <5.0)
Triglycerides: 162 mg/dL — ABNORMAL HIGH (ref <150)

## 2020-11-26 MED ORDER — TELMISARTAN 80 MG PO TABS: 80.0000 mg | ORAL_TABLET | Freq: Every day | ORAL | 1 refills | Status: DC

## 2020-11-26 MED ORDER — TRUEPLUS PEN NEEDLES 31G X 6 MM MISC
10 refills | Status: DC
Start: 2020-11-26 — End: 2022-02-22

## 2020-11-26 MED ORDER — TRULICITY 1.5 MG/0.5ML ~~LOC~~ SOAJ: SUBCUTANEOUS | 4 refills | Status: DC

## 2020-11-26 MED ORDER — ATORVASTATIN CALCIUM 20 MG PO TABS: 20.0000 mg | ORAL_TABLET | Freq: Every day | ORAL | 3 refills | Status: DC

## 2020-11-26 MED ORDER — OMEPRAZOLE 20 MG PO CPDR: 20.0000 mg | DELAYED_RELEASE_CAPSULE | Freq: Every day | ORAL | 3 refills | Status: DC

## 2020-11-26 NOTE — Assessment & Plan Note (Addendum)
A1c looks phenomenal today she is doing great on her current regimen.  Plan to follow-up in 4 months if she has any problems she can call us sooner continue to stay active and work on healthy diet and regular exercise.  Continue Trulicity.  He is due for labs today.

## 2020-11-26 NOTE — Assessment & Plan Note (Signed)
Doing really well off of Celexa now.  We will continue to monitor periodically.

## 2020-11-26 NOTE — Progress Notes (Signed)
Established Patient Office Visit  Subjective:  Patient ID: Ann Torres, female    DOB: 22-Jul-1955  Age: 65 y.o. MRN: [id number]  CC: No chief complaint on file.   HPI Ann Torres presents for    She came off the celexa and doesn't want to restart.  Feels she is doing well.   Dr. Allena Katz, her rheumatologist has moved away. She has a follow up with her partner in October.  Has been having more pain in her hands.   Unfortunately she is really been struggling with her RA over the last 4 months she wonders if the heat and humidity of the summer might actually be aggravating it she still been trying to stay active and go camping and take care of her grandkids but it has been a struggle she is actually using her cane today because she has fallen recently because of her pain she says most days her pain is between an 8 to a 10 she is currently on Orencia but she had previously discussed changing medications with her rheumatologist.  Again her follow-up is in October.  Diabetes - no hypoglycemic events. No wounds or sores that are not healing well. No increased thirst or urination. Checking glucose at home. Taking medications as prescribed without any side effects.  She actually has not had to use her short acting insulin in quite some time she is doing well with the Trulicity but says the shot is painful.   No past medical history on file.  Past Surgical History:  Procedure Laterality Date   BILATERAL OOPHORECTOMY     BREAST BIOPSY     in pts 30s   BREAST CYST EXCISION     CESAREAN SECTION     x 3   CHOLECYSTECTOMY     TOTAL ABDOMINAL HYSTERECTOMY     TUBAL LIGATION      Family History  Problem Relation Age of Onset   Colon cancer Mother    Heart attack Father 89    Social History   Socioeconomic History   Marital status: Divorced    Spouse name: Not on file   Number of children: Not on file   Years of education: 2 yr college   Highest education level: Not on file   Occupational History   Occupation: retired  Tobacco Use   Smoking status: Former    Types: Cigarettes    Quit date: 08/01/2017    Years since quitting: 3.3   Smokeless tobacco: Never  Substance and Sexual Activity   Alcohol use: No   Drug use: No   Sexual activity: Never  Other Topics Concern   Not on file  Social History Narrative   Looks for 15 minutes daily. Denies significant caffeine intake. Currently divorced and not sexually active. Currently living with her daughter and daughter's family.   Social Determinants of Health   Financial Resource Strain: Not on file  Food Insecurity: Not on file  Transportation Needs: Not on file  Physical Activity: Not on file  Stress: Not on file  Social Connections: Not on file  Intimate Partner Violence: Not on file    Outpatient Medications Prior to Visit  Medication Sig Dispense Refill   Abatacept 125 MG/ML SOAJ Inject into the skin.     albuterol (VENTOLIN HFA) 108 (90 Base) MCG/ACT inhaler Inhale 2 puffs into the lungs every 6 (six) hours as needed for wheezing or shortness of breath. 8 g 2   EPINEPHrine 0.3 mg/0.3 mL IJ SOAJ  injection Inject 0.3 mg into the muscle as needed for anaphylaxis. 1 each 1   insulin lispro (HUMALOG KWIKPEN) 100 UNIT/ML KwikPen Give about 10 minutes before meals per Sliding Scale. Max dose of 30 units per day 15 mL 0   Insulin Lispro Prot & Lispro (HUMALOG 75/25 MIX) (75-25) 100 UNIT/ML Kwikpen Inject 20 Units into the skin every morning AND 18 Units every evening. 36 mL 1   atorvastatin (LIPITOR) 20 MG tablet Take 1 tablet (20 mg total) by mouth at bedtime. 90 tablet 3   Dulaglutide (TRULICITY) 1.5 MG/0.5ML SOPN INJECT 1.5 MG UNDER THE SKIN EVERY 7 DAYS. 6 mL 4   omeprazole (PRILOSEC) 20 MG capsule Take 1 capsule (20 mg total) by mouth daily. 90 capsule 3   telmisartan (MICARDIS) 80 MG tablet Take 1 tablet (80 mg total) by mouth daily. 90 tablet 1   TRUEPLUS PEN NEEDLES 31G X 6 MM MISC Use to inject  insulin. 100 each 10   AMBULATORY NON FORMULARY MEDICATION Medication Name: Shingrix IM x1.  Then repeat in 2-6 months. 1 Units 0   citalopram (CELEXA) 40 MG tablet Take 1 tablet (40 mg total) by mouth daily. 90 tablet 1   zolpidem (AMBIEN) 5 MG tablet Take 0.5-1 tablets (2.5-5 mg total) by mouth at bedtime as needed for sleep. 90 tablet 1   No facility-administered medications prior to visit.    Allergies  Allergen Reactions   Bee Venom Anaphylaxis and Hives   Lisinopril Shortness Of Breath   Azathioprine Nausea And Vomiting   Celecoxib Other (See Comments)    Patient reported   Metformin And Related Other (See Comments)    Diarrhea and vomiting   Red Dye Other (See Comments)   Enbrel [Etanercept] Other (See Comments)    ENT felt was causing hearing loss   [place] [Dapagliflozin] Other (See Comments)    Recurrent yeast infections   Hydroxychloroquine Rash   Methotrexate Derivatives Rash   Tape Rash    r    ROS Review of Systems    Objective:    Physical Exam Constitutional:      Appearance: Normal appearance. She is well-developed.  HENT:     Head: Normocephalic and atraumatic.  Cardiovascular:     Rate and Rhythm: Normal rate and regular rhythm.     Heart sounds: Normal heart sounds.  Pulmonary:     Effort: Pulmonary effort is normal.     Breath sounds: Normal breath sounds.  Skin:    General: Skin is warm and dry.  Neurological:     Mental Status: She is alert and oriented to person, place, and time.  Psychiatric:        Behavior: Behavior normal.    BP (!) 148/68   Pulse 76   Ht 5\' 4"  (1.626 m)   Wt 164 lb (74.4 kg)   SpO2 99%   BMI 28.15 kg/m  Wt Readings from Last 3 Encounters:  11/26/20 164 lb (74.4 kg)  08/04/20 168 lb (76.2 kg)  04/25/20 164 lb (74.4 kg)     Health Maintenance Due  Topic Date Due   Zoster Vaccines- Shingrix (1 of 2) Never done   COVID-19 Vaccine (3 - Booster for Janssen series) 04/16/2020    There are no preventive  care reminders to display for this patient.  Lab Results  Component Value Date   TSH 0.86 12/07/2019   Lab Results  Component Value Date   WBC 8.1 12/07/2019   HGB 13.6 12/07/2019  HCT 41.2 12/07/2019   MCV 79.5 (L) 12/07/2019   PLT 228 12/07/2019   Lab Results  Component Value Date   NA 138 08/04/2020   K 4.3 08/04/2020   CO2 24 08/04/2020   GLUCOSE 269 (H) 08/04/2020   BUN 15 08/04/2020   CREATININE 0.70 08/04/2020   BILITOT 0.6 12/07/2019   ALKPHOS 113 08/23/2018   AST 14 12/07/2019   ALT 13 12/07/2019   PROT 6.6 12/07/2019   ALBUMIN 4.6 08/23/2018   CALCIUM 9.6 08/04/2020   Lab Results  Component Value Date   CHOL 139 12/07/2019   Lab Results  Component Value Date   HDL 54 12/07/2019   Lab Results  Component Value Date   LDLCALC 62 12/07/2019   Lab Results  Component Value Date   TRIG 145 12/07/2019   Lab Results  Component Value Date   CHOLHDL 2.6 12/07/2019   Lab Results  Component Value Date   HGBA1C 6.2 (A) 11/26/2020      Assessment & Plan:   Problem List Items Addressed This Visit       Cardiovascular and Mediastinum   Essential hypertension    Blood pressure was elevated today.  Usually it looks little bit better than this we will keep an eye on it.       Relevant Medications   atorvastatin (LIPITOR) 20 MG tablet   telmisartan (MICARDIS) 80 MG tablet   Other Relevant Orders   Lipid panel   COMPLETE METABOLIC PANEL WITH GFR     Endocrine   Diabetes mellitus without complication (HCC) - Primary    A1c looks phenomenal today she is doing great on her current regimen.  Plan to follow-up in 4 months if she has any problems she can call us sooner continue to stay active and work on healthy diet and regular exercise.  Continue Trulicity.  He is due for labs today.       Relevant Medications   atorvastatin (LIPITOR) 20 MG tablet   Dulaglutide (TRULICITY) 1.5 MG/0.5ML SOPN   telmisartan (MICARDIS) 80 MG tablet   Insulin Pen Needle  (TRUEPLUS PEN NEEDLES) 31G X 6 MM MISC   Other Relevant Orders   POCT glycosylated hemoglobin (Hb A1C) (Completed)   Lipid panel   COMPLETE METABOLIC PANEL WITH GFR     Musculoskeletal and Integument   Rheumatoid arthritis involving multiple sites [facility])    She will be establishing with the new rheumatologist at the office.  Unfortunately they will not be coming to [place] anymore.  She is can address with him possibly changing her regimen up in October but will continue the Orencia for now.         Other   Depression, major, single episode, complete remission (HCC)    Doing really well off of Celexa now.  We will continue to monitor periodically.        Meds ordered this encounter  Medications   atorvastatin (LIPITOR) 20 MG tablet    Sig: Take 1 tablet (20 mg total) by mouth at bedtime.    Dispense:  90 tablet    Refill:  3   Dulaglutide (TRULICITY) 1.5 MG/0.5ML SOPN    Sig: INJECT 1.5 MG UNDER THE SKIN EVERY 7 DAYS.    Dispense:  6 mL    Refill:  4   omeprazole (PRILOSEC) 20 MG capsule    Sig: Take 1 capsule (20 mg total) by mouth daily.    Dispense:  90 capsule    Refill:  3   telmisartan (MICARDIS) 80 MG tablet    Sig: Take 1 tablet (80 mg total) by mouth daily.    Dispense:  90 tablet    Refill:  1    This prescription was filled on 03/10/2020. Any refills authorized will be placed on file.   Insulin Pen Needle (TRUEPLUS PEN NEEDLES) 31G X 6 MM MISC    Sig: Use to inject insulin.    Dispense:  100 each    Refill:  10    Follow-up: Return in about 4 months (around 03/29/2021) for Diabetes follow-up.    Nani Gasser, MD

## 2020-11-26 NOTE — Assessment & Plan Note (Signed)
Blood pressure was elevated today.  Usually it looks little bit better than this we will keep an eye on it.

## 2020-11-26 NOTE — Assessment & Plan Note (Signed)
She will be establishing with the new rheumatologist at the office.  Unfortunately they will not be coming to [place] anymore.  She is can address with him possibly changing her regimen up in October but will continue the Orencia for now.

## 2020-11-27 ENCOUNTER — Telehealth: Payer: Self-pay | Admitting: Pharmacist

## 2020-11-27 NOTE — Chronic Care Management (AMB) (Signed)
    Chronic Care Management Pharmacy Assistant   Name: Ann Torres  MRN: [id number] DOB: Apr 29, 1956  Ann Torres is an 65 y.o. year old female who presents for his initial CCM visit with the clinical pharmacist.   Recent office visits:  11/26/20-Catherine Metheney, MD (PCP) Seen for Diabetes Mellitus.Labs ordered. Follow up in 4 months. 08/04/20-Catherine Metheney, MD (PCP) General follow up. Start on EPINEPHrine 0.3 mg/0.3 mL IJ SOAJ injection as needed for honey bee venom allergy. Bone density ordered. Follow up in 3 months. Recent consult visits:  None noted  Hospital visits:  None in previous 6 months  Medications: Outpatient Encounter Medications as of 11/27/2020  Medication Sig   Abatacept 125 MG/ML SOAJ Inject into the skin.   albuterol (VENTOLIN HFA) 108 (90 Base) MCG/ACT inhaler Inhale 2 puffs into the lungs every 6 (six) hours as needed for wheezing or shortness of breath.   atorvastatin (LIPITOR) 20 MG tablet Take 1 tablet (20 mg total) by mouth at bedtime.   Dulaglutide (TRULICITY) 1.5 MG/0.5ML SOPN INJECT 1.5 MG UNDER THE SKIN EVERY 7 DAYS.   EPINEPHrine 0.3 mg/0.3 mL IJ SOAJ injection Inject 0.3 mg into the muscle as needed for anaphylaxis.   insulin lispro (HUMALOG KWIKPEN) 100 UNIT/ML KwikPen Give about 10 minutes before meals per Sliding Scale. Max dose of 30 units per day   Insulin Lispro Prot & Lispro (HUMALOG 75/25 MIX) (75-25) 100 UNIT/ML Kwikpen Inject 20 Units into the skin every morning AND 18 Units every evening.   Insulin Pen Needle (TRUEPLUS PEN NEEDLES) 31G X 6 MM MISC Use to inject insulin.   omeprazole (PRILOSEC) 20 MG capsule Take 1 capsule (20 mg total) by mouth daily.   telmisartan (MICARDIS) 80 MG tablet Take 1 tablet (80 mg total) by mouth daily.   No facility-administered encounter medications on file as of 11/27/2020.   Abatacept 125 MG/ML SOAJ Last filled:11/25/20 28 DS Albuterol (VENTOLIN HFA) 108 (90 Base) MCG/ACT inhaler Last  filled:09/16/20 25 DS Atorvastatin (LIPITOR) 20 MG tablet Last filled:09/16/20 90 DS Dulaglutide (TRULICITY) 1.5 MG/0.5ML SOPN Last filled:09/16/20 84 DS EPINEPHrine 0.3 mg/0.3 mL IJ SOAJ injection Last filled:08/04/20 15 DS insulin lispro (HUMALOG KWIKPEN) 100 UNIT/ML KwikPen Last filled: 05/28/20 50 DS Insulin Lispro Prot & Lispro (HUMALOG 75/25 MIX) (75-25) 100 UNIT/ML Kwikpen Last filled:08/20/20 71 DS omeprazole (PRILOSEC) 20 MG capsule Last filled:09/16/20 90 DS Telmisartan (MICARDIS) 80 MG tablet Last filled:09/16/20 90 DS   Star Rating Drugs: Dulaglutide (TRULICITY) 1.5 MG/0.5ML SOPN Last filled:09/16/20 84 DS Atorvastatin (LIPITOR) 20 MG tablet Last filled:09/16/20 90 DS  Myriam Carolin Coy, RMA Health Concierge

## 2020-12-04 ENCOUNTER — Ambulatory Visit (INDEPENDENT_AMBULATORY_CARE_PROVIDER_SITE_OTHER): Payer: Medicare Other | Admitting: Pharmacist

## 2020-12-04 ENCOUNTER — Other Ambulatory Visit: Payer: Self-pay

## 2020-12-04 DIAGNOSIS — E785 Hyperlipidemia, unspecified: Secondary | ICD-10-CM

## 2020-12-04 DIAGNOSIS — E119 Type 2 diabetes mellitus without complications: Secondary | ICD-10-CM

## 2020-12-04 DIAGNOSIS — I10 Essential (primary) hypertension: Secondary | ICD-10-CM

## 2020-12-04 NOTE — Progress Notes (Signed)
Chronic Care Management Pharmacy Note  12/04/2020 Name:  Ann Torres MRN:  [id number] DOB:  August 23, 1955  Summary: addressed HTN, HLD, DM  Recommendations/Changes made from today's visit: pt expressed interest in CGM, placed DME order for her today, results pending.   Plan: f/u with pharmacist in 2 months (or sooner if CGM arrives)  Subjective: Ann Torres is an 65 y.o. year old female who is a primary patient of Metheney, Rene Kocher, MD.  The CCM team was consulted for assistance with disease management and care coordination needs.    Engaged with patient by telephone for initial visit in response to provider referral for pharmacy case management and/or care coordination services.   Consent to Services:  The patient was given information about Chronic Care Management services, agreed to services, and gave verbal consent prior to initiation of services.  Please see initial visit note for detailed documentation.   Patient Care Team: Hali Marry, MD as PCP - General (Family Medicine) Darius Bump, [facility] as Pharmacist (Pharmacist)  Recent office visits:  11/26/20-Catherine Metheney, MD (PCP) Seen for Diabetes Mellitus.Labs ordered. Follow up in 4 months. 08/04/20-Catherine Metheney, MD (PCP) General follow up. Start on EPINEPHrine 0.3 mg/0.3 mL IJ SOAJ injection as needed for honey bee venom allergy. Bone density ordered. Follow up in 3 months. Recent consult visits:  None noted   Hospital visits:  None in previous 6 months  Objective:  Lab Results  Component Value Date   CREATININE 0.71 11/26/2020   CREATININE 0.70 08/04/2020   CREATININE 0.66 12/07/2019    Lab Results  Component Value Date   HGBA1C 6.2 (A) 11/26/2020   Last diabetic Eye exam:  Lab Results  Component Value Date/Time   HMDIABEYEEXA No Retinopathy 03/12/2020 12:00 AM    Last diabetic Foot exam: No results found for: HMDIABFOOTEX      Component Value Date/Time   CHOL 146 11/26/2020  0000   CHOL 174 08/23/2018 1006   TRIG 162 (H) 11/26/2020 0000   HDL 53 11/26/2020 0000   HDL 43 08/23/2018 1006   CHOLHDL 2.8 11/26/2020 0000   LDLCALC 69 11/26/2020 0000    Hepatic Function Latest Ref Rng & Units 11/26/2020 12/07/2019 10/06/2018  Total Protein 6.1 - 8.1 g/dL 7.0 6.6 6.2  Albumin 3.8 - 4.8 g/dL - - -  AST 10 - 35 U/L _0 ALT 6 - 29 U/L _1 Alk Phosphatase 39 - 117 IU/L - - -  Total Bilirubin 0.2 - 1.2 mg/dL 0.5 0.6 0.6    Lab Results  Component Value Date/Time   TSH 0.86 12/07/2019 08:08 AM    CBC Latest Ref Rng & Units 12/07/2019 03/14/2019 10/06/2018  WBC 3.8 - 10.8 Thousand/uL 8.1 - 7.6  Hemoglobin 11.7 - 15.5 g/dL 13.6 13.4 12.8  Hematocrit 35.0 - 45.0 % 41.2 40.4 38.3  Platelets 140 - 400 Thousand/uL 228 - 260     Social History   Tobacco Use  Smoking Status Former   Types: Cigarettes   Quit date: 08/01/2017   Years since quitting: 3.3  Smokeless Tobacco Never   BP Readings from Last 3 Encounters:  11/26/20 (!) 148/68  08/04/20 128/78  04/25/20 138/90   Pulse Readings from Last 3 Encounters:  11/26/20 76  08/04/20 86  04/25/20 95   Wt Readings from Last 3 Encounters:  11/26/20 164 lb (74.4 kg)  08/04/20 168 lb (76.2 kg)  04/25/20 164 lb (74.4 kg)    Assessment:  Review of patient past medical history, allergies, medications, health status, including review of consultants reports, laboratory and other test data, was performed as part of comprehensive evaluation and provision of chronic care management services.   SDOH:  (Social Determinants of Health) assessments and interventions performed:    CCM Care Plan  Allergies  Allergen Reactions   Bee Venom Anaphylaxis and Hives   Lisinopril Shortness Of Breath   Azathioprine Nausea And Vomiting   Celecoxib Other (See Comments)    Patient reported   Metformin And Related Other (See Comments)    Diarrhea and vomiting   Red Dye Other (See Comments)   Enbrel [Etanercept] Other (See  Comments)    ENT felt was causing hearing loss   [place] [Dapagliflozin] Other (See Comments)    Recurrent yeast infections   Hydroxychloroquine Rash   Methotrexate Derivatives Rash   Tape Rash    r    Medications Reviewed Today     Reviewed by Darius Bump, [place] (Pharmacist) on 12/04/20 at 1115  Med List Status: <None>   Medication Order Taking? Sig Documenting Provider Last Dose Status Informant  Abatacept 125 MG/ML SOAJ [id number] Yes Inject 1 Dose into the skin once a week. Sundays [provider] Taking Active   albuterol (VENTOLIN HFA) 108 (90 Base) MCG/ACT inhaler [id number] Yes Inhale 2 puffs into the lungs every 6 (six) hours as needed for wheezing or shortness of breath. Hali Marry, MD Taking Active   atorvastatin (LIPITOR) 20 MG tablet [id number] Yes Take 1 tablet (20 mg total) by mouth at bedtime. Hali Marry, MD Taking Active   Dulaglutide (TRULICITY) 1.5 VF/6.4PP [facility] [facility] [id number] Yes INJECT 1.5 MG UNDER THE SKIN EVERY 7 DAYS. Hali Marry, MD Taking Active            Med Note Tonny Bollman Dec 04, 2020 11:12 AM) Sundays  EPINEPHrine 0.3 mg/0.3 mL IJ SOAJ injection [id number] Yes Inject 0.3 mg into the muscle as needed for anaphylaxis. Hali Marry, MD Taking Active   insulin lispro [facility]) 100 UNIT/ML KwikPen [id number] Yes Give about 10 minutes before meals per Sliding Scale. Max dose of 30 units per day Hali Marry, MD Taking Active   Insulin Lispro Prot & Lispro (HUMALOG 75/25 MIX) (75-25) 100 UNIT/ML Claiborne Rigg [id number] Yes Inject 20 Units into the skin every morning AND 18 Units every evening. Hali Marry, MD Taking Active   Insulin Pen Needle (TRUEPLUS PEN NEEDLES) 31G X 6 MM MISC [id number] Yes Use to inject insulin. Hali Marry, MD Taking Active   omeprazole (PRILOSEC) 20 MG capsule [id number] Yes Take 1 capsule (20 mg total) by mouth daily. Hali Marry, MD Taking Active    telmisartan (MICARDIS) 80 MG tablet [id number] Yes Take 1 tablet (80 mg total) by mouth daily. Hali Marry, MD Taking Active             Patient Active Problem List   Diagnosis Date Noted   Osteopenia 08/27/2020   Allergy to honey bee venom 08/04/2020   Memory impairment 12/06/2019   Trigger finger 08/07/2019   Primary insomnia 03/23/2018   Long-term use of high-risk medication 02/06/2018   Calcific Achilles tendinitis of left lower extremity 10/06/2017   Haglund's deformity of left heel 10/06/2017   Essential hypertension 02/01/2017   Rheumatoid arthritis involving multiple sites ([place]) 02/01/2017   Diabetes mellitus without complication ([place]) 54/62/7035   History of colon polyps 02/01/2017   Depression, major,  single episode, complete remission ([place]) 02/01/2017   Hyperlipidemia 02/01/2017    Immunization History  Administered Date(s) Administered   Influenza,inj,Quad PF,6+ Mos 02/01/2017, 01/24/2018, 01/15/2019, 01/31/2020   Janssen (J&J) SARS-COV-2 Vaccination 07/21/2019   [name](Purple Top)SARS-COV-2 Vaccination 02/20/2020   Pneumococcal Conjugate-13 03/06/2020   Tdap 07/01/2012    Conditions to be addressed/monitored: HTN, HLD, and DMII  Care Plan : Medication Management  Updates made by Darius Bump, [place] since 12/04/2020 12:00 AM     Problem: HTN, DM, HLD      Long-Range Goal: Disease Progression Prevention   Start Date: 12/04/2020  This Visit's Progress: On track  Priority: High  Note:   Current Barriers:  None at present  Pharmacist Clinical Goal(s):  Over the next 60 days, patient will maintain control of chronic conditions as evidenced by medication fill history, lab values, and vital signs  through collaboration with PharmD and provider.   Interventions: 1:1 collaboration with Hali Marry, MD regarding development and update of comprehensive plan of care as evidenced by provider attestation and co-signature Inter-disciplinary  care team collaboration (see longitudinal plan of care) Comprehensive medication review performed; medication list updated in electronic medical record  Diabetes:  Controlled; current treatment:Trulicity 4.0JW weekly, insulin 75/25 injects 20 units AM and 18 units PM; a1c 6.2  Current glucose readings: fasting glucose: 130s, post prandial glucose: 170-190  Denies hypoglycemic/hyperglycemic symptoms  Current meal patterns: breakfast: egg, fruit, or 1/2 of mini bagel + fruit; lunch: protein shake, 1/2 tuna sandwich in pita pocket w/onion, fruit; dinner: chicken & veggies, meatloaf + yams, fish, spaghetti, sandwiches; snacks: string cheese, graham crackers, fruit, grapes, ice cream; drinks: milk and water   Current exercise: watches grandsons, [facility], aqua aerobics, walk, outside playing  Educated on mechanism of medications to explain why she is feeling full Recommended continue current regimen, offered patient CGM d/t number of injections and patient expressed interest, submitted DME order, results pending. Hypertension:  Controlled; current treatment:telmisartan 9m daily;   Current home readings: 120s/70-80s,    Denies hypotensive/hypertensive symptoms  Recommended continue current regimen,  Hyperlipidemia:  Controlled; current treatment:atorvastatin 281m; LDL 69  Recommended continue current regimen  Patient Goals/Self-Care Activities Over the next 60 days, patient will:  take medications as prescribed  Follow Up Plan: Telephone follow up appointment with care management team member scheduled for:  2 months     Medication Assistance: None required.  Patient affirms current coverage meets needs.  [place]:  [place] [address]hone: [phone]: [phone] [place] [place]hone: [phone]: [phone] pill box? Yes "Mediplanner" sets up for 3 weeks Pt endorses 100% compliance  Follow Up:  Patient agrees to Care Plan and Follow-up.  Plan: Telephone follow up appointment with care management team member scheduled for:  2 months, or sooner if CGM arrives  [employer]

## 2020-12-04 NOTE — Patient Instructions (Signed)
Ann Torres,   I submitted the order today for your continuous glucose monitor. Remember to answer the phone in case the company calls you to verify your address for delivery. I am going to adjust our phone call sooner, to about 2 months out - but [place] SOONER or submit a MyChart message asking for [facility] the Pharmacist if your glucose monitor is approved & delivered before then!! We can get you on my schedule to come in to the office and I'll show you have to apply the sensor, and set everything up.  Thanks, Lysle Morales  Visit Information   PATIENT GOALS:   Goals Addressed             This Visit's Progress    Medication Management       Patient Goals/Self-Care Activities Over the next 60 days, patient will:  take medications as prescribed  Follow Up Plan: Telephone follow up appointment with care management team member scheduled for:  60 days        Consent to CCM Services: Ms. Mundorf was given information about Chronic Care Management services including:  CCM service includes personalized support from designated clinical staff supervised by her physician, including individualized plan of care and coordination with other care providers 24/7 contact phone numbers for assistance for urgent and routine care needs. Service will only be billed when office clinical staff spend 20 minutes or more in a month to coordinate care. Only one practitioner may furnish and bill the service in a calendar month. The patient may stop CCM services at any time (effective at the end of the month) by phone call to the office staff. The patient will be responsible for cost sharing (co-pay) of up to 20% of the service fee (after annual deductible is met).  Patient agreed to services and verbal consent obtained.   Patient verbalizes understanding of instructions provided today and agrees to view in [place].   Telephone follow up appointment with care management team member scheduled for: 2  months  CLINICAL CARE PLAN: Patient Care Plan: Medication Management     Problem Identified: HTN, DM, HLD      Long-Range Goal: Disease Progression Prevention   Start Date: 12/04/2020  This Visit's Progress: On track  Priority: High  Note:   Current Barriers:  None at present  Pharmacist Clinical Goal(s):  Over the next 60 days, patient will maintain control of chronic conditions as evidenced by medication fill history, lab values, and vital signs  through collaboration with PharmD and provider.   Interventions: 1:1 collaboration with Hali Marry, MD regarding development and update of comprehensive plan of care as evidenced by provider attestation and co-signature Inter-disciplinary care team collaboration (see longitudinal plan of care) Comprehensive medication review performed; medication list updated in electronic medical record  Diabetes:  Controlled; current treatment:Trulicity 4.1DQ weekly, insulin 75/25 injects 20 units AM and 18 units PM; a1c 6.2  Current glucose readings: fasting glucose: 130s, post prandial glucose: 170-190  Denies hypoglycemic/hyperglycemic symptoms  Current meal patterns: breakfast: egg, fruit, or 1/2 of mini bagel + fruit; lunch: protein shake, 1/2 tuna sandwich in pita pocket w/onion, fruit; dinner: chicken & veggies, meatloaf + yams, fish, spaghetti, sandwiches; snacks: string cheese, graham crackers, fruit, grapes, ice cream; drinks: milk and water   Current exercise: watches grandsons, [facility] the [facility], aqua aerobics, walk, outside playing  Educated on mechanism of medications to explain why she is feeling full Recommended continue current regimen, offered patient CGM d/t number of  injections and patient expressed interest, submitted DME order, results pending. Hypertension:  Controlled; current treatment:telmisartan 26m daily;   Current home readings: 120s/70-80s,    Denies hypotensive/hypertensive symptoms  Recommended continue  current regimen,  Hyperlipidemia:  Controlled; current treatment:atorvastatin 26m; LDL 69  Recommended continue current regimen  Patient Goals/Self-Care Activities Over the next 60 days, patient will:  take medications as prescribed  Follow Up Plan: Telephone follow up appointment with care management team member scheduled for:  2 months

## 2020-12-11 DIAGNOSIS — E119 Type 2 diabetes mellitus without complications: Secondary | ICD-10-CM | POA: Diagnosis not present

## 2020-12-12 ENCOUNTER — Telehealth: Payer: Self-pay | Admitting: Pharmacist

## 2020-12-12 NOTE — Telephone Encounter (Signed)
Attempted to contact patient, unsuccessful, left voicemail:  Her glucose monitor is showing for tentative delivery to her house 12/13/20, I wanted to offer her an onsite appointment Wednesday 12/17/20 with me to apply the sensor to arm, and go through details.  Advised patient to call main office if she is interested in this appointment, so if she calls, please place her on my schedule as:  Visit type: CCM Follow up Length: 30 minutes Visit notes: new CGM (Most openings for 12/17/20 are 2pm and later.)  Please advise & REMIND her to bring her box of the glucose monitor and all supplies delivered.   Thank you, Thurston Hole

## 2020-12-28 DIAGNOSIS — E119 Type 2 diabetes mellitus without complications: Secondary | ICD-10-CM | POA: Diagnosis not present

## 2020-12-28 DIAGNOSIS — R220 Localized swelling, mass and lump, head: Secondary | ICD-10-CM | POA: Diagnosis not present

## 2020-12-28 DIAGNOSIS — L259 Unspecified contact dermatitis, unspecified cause: Secondary | ICD-10-CM | POA: Diagnosis not present

## 2021-01-06 ENCOUNTER — Telehealth: Payer: Self-pay | Admitting: Family Medicine

## 2021-01-06 NOTE — Telephone Encounter (Signed)
Left message for patient to call back and schedule Medicare Annual Wellness Visit (AWV) either virtually   awvi 08/02/19 per [place]  please schedule at anytime with health coach  This should be a 45 minute visit.

## 2021-01-10 ENCOUNTER — Other Ambulatory Visit: Payer: Self-pay

## 2021-01-10 ENCOUNTER — Ambulatory Visit (INDEPENDENT_AMBULATORY_CARE_PROVIDER_SITE_OTHER): Payer: Medicare Other

## 2021-01-10 DIAGNOSIS — Z0000 Encounter for general adult medical examination without abnormal findings: Secondary | ICD-10-CM

## 2021-01-10 NOTE — Patient Instructions (Signed)
Ann Torres , Thank you for taking time to come for your Medicare Wellness Visit. I appreciate your ongoing commitment to your health goals. Please review the following plan we discussed and let me know if I can assist you in the future.   Screening recommendations/referrals: Colonoscopy: Done 05/18/19 repeat every 5 years due 05/17/29 Mammogram: Done 02/14/20 repeat every year Bone Density: Done 08/27/20 repeat every 2 years  Recommended yearly ophthalmology/optometry visit for glaucoma screening and checkup Recommended yearly dental visit for hygiene and checkup  Vaccinations: Influenza vaccine: Due Pneumococcal vaccine: Due  Tdap vaccine: Done 07/01/12 repeat every 10 years due 07/02/22 Shingles vaccine: Shingrix discussed. Please contact your pharmacy for coverage information.    Covid-19:Completed 10/20, 07/21/19 & 08/05/20  Advanced directives: Advance directive discussed with you today. a copy available at the office, for you to complete at home and have notarized. Once this is complete please bring a copy in to our office so we can scan it into your chart.   Conditions/risks identified: None at this time  Next appointment: Follow up in one year for your annual wellness visit    Preventive Care 65 Years and Older, Female Preventive care refers to lifestyle choices and visits with your health care provider that can promote health and wellness. What does preventive care include? A yearly physical exam. This is also called an annual well check. Dental exams once or twice a year. Routine eye exams. Ask your health care provider how often you should have your eyes checked. Personal lifestyle choices, including: Daily care of your teeth and gums. Regular physical activity. Eating a healthy diet. Avoiding tobacco and drug use. Limiting alcohol use. Practicing safe sex. Taking low-dose aspirin every day. Taking vitamin and mineral supplements as recommended by your health care  provider. What happens during an annual well check? The services and screenings done by your health care provider during your annual well check will depend on your age, overall health, lifestyle risk factors, and family history of disease. Counseling  Your health care provider may ask you questions about your: Alcohol use. Tobacco use. Drug use. Emotional well-being. Home and relationship well-being. Sexual activity. Eating habits. History of falls. Memory and ability to understand (cognition). Work and work Astronomer. Reproductive health. Screening  You may have the following tests or measurements: Height, weight, and BMI. Blood pressure. Lipid and cholesterol levels. These may be checked every 5 years, or more frequently if you are over 19 years old. Skin check. Lung cancer screening. You may have this screening every year starting at age 32 if you have a 30-pack-year history of smoking and currently smoke or have quit within the past 15 years. Fecal occult blood test (FOBT) of the stool. You may have this test every year starting at age 26. Flexible sigmoidoscopy or colonoscopy. You may have a sigmoidoscopy every 5 years or a colonoscopy every 10 years starting at age 47. Hepatitis C blood test. Hepatitis B blood test. Sexually transmitted disease (STD) testing. Diabetes screening. This is done by checking your blood sugar (glucose) after you have not eaten for a while (fasting). You may have this done every 1-3 years. Bone density scan. This is done to screen for osteoporosis. You may have this done starting at age 42. Mammogram. This may be done every 1-2 years. Talk to your health care provider about how often you should have regular mammograms. Talk with your health care provider about your test results, treatment options, and if necessary, the need for  more tests. Vaccines  Your health care provider may recommend certain vaccines, such as: Influenza vaccine. This is  recommended every year. Tetanus, diphtheria, and acellular pertussis (Tdap, Td) vaccine. You may need a Td booster every 10 years. Zoster vaccine. You may need this after age 82. Pneumococcal 13-valent conjugate (PCV13) vaccine. One dose is recommended after age 71. Pneumococcal polysaccharide (PPSV23) vaccine. One dose is recommended after age 54. Talk to your health care provider about which screenings and vaccines you need and how often you need them. This information is not intended to replace advice given to you by your health care provider. Make sure you discuss any questions you have with your health care provider. Document Released: 05/16/2015 Document Revised: 01/07/2016 Document Reviewed: 02/18/2015 Elsevier Interactive Patient Education  2017 [place] Prevention in the Home Falls can cause injuries. They can happen to people of all ages. There are many things you can do to make your home safe and to help prevent falls. What can I do on the outside of my home? Regularly fix the edges of walkways and driveways and fix any cracks. Remove anything that might make you trip as you walk through a door, such as a raised step or threshold. Trim any bushes or trees on the path to your home. Use bright outdoor lighting. Clear any walking paths of anything that might make someone trip, such as rocks or tools. Regularly check to see if handrails are loose or broken. Make sure that both sides of any steps have handrails. Any raised decks and porches should have guardrails on the edges. Have any leaves, snow, or ice cleared regularly. Use sand or salt on walking paths during winter. Clean up any spills in your garage right away. This includes oil or grease spills. What can I do in the bathroom? Use night lights. Install grab bars by the toilet and in the tub and shower. Do not use towel bars as grab bars. Use non-skid mats or decals in the tub or shower. If you need to sit down in  the shower, use a plastic, non-slip stool. Keep the floor dry. Clean up any water that spills on the floor as soon as it happens. Remove soap buildup in the tub or shower regularly. Attach bath mats securely with double-sided non-slip rug tape. Do not have throw rugs and other things on the floor that can make you trip. What can I do in the bedroom? Use night lights. Make sure that you have a light by your bed that is easy to reach. Do not use any sheets or blankets that are too big for your bed. They should not hang down onto the floor. Have a firm chair that has side arms. You can use this for support while you get dressed. Do not have throw rugs and other things on the floor that can make you trip. What can I do in the kitchen? Clean up any spills right away. Avoid walking on wet floors. Keep items that you use a lot in easy-to-reach places. If you need to reach something above you, use a strong step stool that has a grab bar. Keep electrical cords out of the way. Do not use floor polish or wax that makes floors slippery. If you must use wax, use non-skid floor wax. Do not have throw rugs and other things on the floor that can make you trip. What can I do with my stairs? Do not leave any items on the stairs. Make  sure that there are handrails on both sides of the stairs and use them. Fix handrails that are broken or loose. Make sure that handrails are as long as the stairways. Check any carpeting to make sure that it is firmly attached to the stairs. Fix any carpet that is loose or worn. Avoid having throw rugs at the top or bottom of the stairs. If you do have throw rugs, attach them to the floor with carpet tape. Make sure that you have a light switch at the top of the stairs and the bottom of the stairs. If you do not have them, ask someone to add them for you. What else can I do to help prevent falls? Wear shoes that: Do not have high heels. Have rubber bottoms. Are comfortable  and fit you well. Are closed at the toe. Do not wear sandals. If you use a stepladder: Make sure that it is fully opened. Do not climb a closed stepladder. Make sure that both sides of the stepladder are locked into place. Ask someone to hold it for you, if possible. Clearly mark and make sure that you can see: Any grab bars or handrails. First and last steps. Where the edge of each step is. Use tools that help you move around (mobility aids) if they are needed. These include: Canes. Walkers. Scooters. Crutches. Turn on the lights when you go into a dark area. Replace any light bulbs as soon as they burn out. Set up your furniture so you have a clear path. Avoid moving your furniture around. If any of your floors are uneven, fix them. If there are any pets around you, be aware of where they are. Review your medicines with your doctor. Some medicines can make you feel dizzy. This can increase your chance of falling. Ask your doctor what other things that you can do to help prevent falls. This information is not intended to replace advice given to you by your health care provider. Make sure you discuss any questions you have with your health care provider. Document Released: 02/13/2009 Document Revised: 09/25/2015 Document Reviewed: 05/24/2014 Elsevier Interactive Patient Education  2017 [employer].

## 2021-01-10 NOTE — Progress Notes (Signed)
Virtual Visit via Telephone Note  I connected with  Ann Torres on 01/10/21 at  8:00 AM EDT by telephone and verified that I am speaking with the correct person using two identifiers.  Medicare Annual Wellness visit completed telephonically due to Covid-19 pandemic.   Persons participating in this call: This Health Coach and this patient.   Location: Patient: Home Provider: Office   I discussed the limitations, risks, security and privacy concerns of performing an evaluation and management service by telephone and the availability of in person appointments. The patient expressed understanding and agreed to proceed.  Unable to perform video visit due to video visit attempted and failed and/or patient does not have video capability.   Some vital signs may be absent or patient reported.   Marzella Schlein, LPN   Subjective:   Ann Torres is a 65 y.o. female who presents for an Initial Medicare Annual Wellness Visit.  Review of Systems     Cardiac Risk Factors include: advanced age (>66men, >77 women);diabetes mellitus;hypertension;dyslipidemia     Objective:    There were no vitals filed for this visit. There is no height or weight on file to calculate BMI.  Advanced Directives 01/10/2021 03/17/2020 02/01/2017  Does Patient Have a Medical Advance Directive? No No No  Would patient like information on creating a medical advance directive? No - Patient declined Yes (ED - Information included in AVS) No - Patient declined    Current Medications (verified) Outpatient Encounter Medications as of 01/10/2021  Medication Sig   Abatacept 125 MG/ML SOAJ Inject 1 Dose into the skin once a week. Sundays   albuterol (VENTOLIN HFA) 108 (90 Base) MCG/ACT inhaler Inhale 2 puffs into the lungs every 6 (six) hours as needed for wheezing or shortness of breath.   atorvastatin (LIPITOR) 20 MG tablet Take 1 tablet (20 mg total) by mouth at bedtime.   Calcium-Vitamin D-Vitamin K (VIACTIV  CALCIUM PLUS D) 650-12.5-40 MG-MCG-MCG CHEW Chew 2 tablets by mouth in the morning and at bedtime.   Dulaglutide (TRULICITY) 1.5 MG/0.5ML SOPN INJECT 1.5 MG UNDER THE SKIN EVERY 7 DAYS.   EPINEPHrine 0.3 mg/0.3 mL IJ SOAJ injection Inject 0.3 mg into the muscle as needed for anaphylaxis.   insulin lispro (HUMALOG KWIKPEN) 100 UNIT/ML KwikPen Give about 10 minutes before meals per Sliding Scale. Max dose of 30 units per day   Insulin Lispro Prot & Lispro (HUMALOG 75/25 MIX) (75-25) 100 UNIT/ML Kwikpen Inject 20 Units into the skin every morning AND 18 Units every evening.   Insulin Pen Needle (TRUEPLUS PEN NEEDLES) 31G X 6 MM MISC Use to inject insulin.   omeprazole (PRILOSEC) 20 MG capsule Take 1 capsule (20 mg total) by mouth daily.   telmisartan (MICARDIS) 80 MG tablet Take 1 tablet (80 mg total) by mouth daily.   [name]-BIONT COVID-19 VAC-TRIS SUSP injection    No facility-administered encounter medications on file as of 01/10/2021.    Allergies (verified) Bee venom, Lisinopril, Azathioprine, Celecoxib, Metformin and related, Red dye, Enbrel [etanercept], Farxiga [dapagliflozin], Hydroxychloroquine, Methotrexate derivatives, and Tape   History: History reviewed. No pertinent past medical history. Past Surgical History:  Procedure Laterality Date   BILATERAL OOPHORECTOMY     BREAST BIOPSY     in pts 30s   BREAST CYST EXCISION     CESAREAN SECTION     x 3   CHOLECYSTECTOMY     TOTAL ABDOMINAL HYSTERECTOMY     TUBAL LIGATION     Family History  Problem Relation Age of Onset   Colon cancer Mother    Heart attack Father 53   Social History   Socioeconomic History   Marital status: Divorced    Spouse name: Not on file   Number of children: Not on file   Years of education: 2 yr college   Highest education level: Not on file  Occupational History   Occupation: retired  Tobacco Use   Smoking status: Former    Types: Cigarettes    Quit date: 08/01/2017    Years since quitting:  3.4   Smokeless tobacco: Never  Substance and Sexual Activity   Alcohol use: No   Drug use: No   Sexual activity: Never  Other Topics Concern   Not on file  Social History Narrative   Looks for 15 minutes daily. Denies significant caffeine intake. Currently divorced and not sexually active. Currently living with her daughter and daughter's family.   Social Determinants of Health   Financial Resource Strain: Low Risk    Difficulty of Paying Living Expenses: Not hard at all  Food Insecurity: No Food Insecurity   Worried About Programme researcher, broadcasting/film/video in the Last Year: Never true   Ran Out of Food in the Last Year: Never true  Transportation Needs: No Transportation Needs   Lack of Transportation (Medical): No   Lack of Transportation (Non-Medical): No  Physical Activity: Inactive   Days of Exercise per Week: 0 days   Minutes of Exercise per Session: 0 min  Stress: No Stress Concern Present   Feeling of Stress : Not at all  Social Connections: Moderately Isolated   Frequency of Communication with Friends and Family: More than three times a week   Frequency of Social Gatherings with Friends and Family: More than three times a week   Attends Religious Services: Never   Database administrator or Organizations: Yes   Attends Engineer, structural: 1 to 4 times per year   Marital Status: Divorced    Tobacco Counseling Counseling given: Not Answered   Clinical Intake:  Pre-visit preparation completed: Yes  Pain : No/denies pain     BMI - recorded: 28.15 Nutritional Status: BMI 25 -29 Overweight Nutritional Risks: None Diabetes: Yes CBG done?: Yes (142) CBG resulted in Enter/ Edit results?: No Did pt. bring in CBG monitor from home?: No  How often do you need to have someone help you when you read instructions, pamphlets, or other written materials from your doctor or pharmacy?: 1 - Never  Diabetic?Nutrition Risk Assessment:  Has the patient had any N/V/D within  the last 2 months?  No  Does the patient have any non-healing wounds?  No  Has the patient had any unintentional weight loss or weight gain?  No   Diabetes:  Is the patient diabetic?  Yes  If diabetic, was a CBG obtained today?  Yes  Did the patient bring in their glucometer from home?  No  How often do you monitor your CBG's? Daily.   Financial Strains and Diabetes Management:  Are you having any financial strains with the device, your supplies or your medication? No .  Does the patient want to be seen by Chronic Care Management for management of their diabetes?  No  Would the patient like to be referred to a Nutritionist or for Diabetic Management?  No   Diabetic Exams:  Diabetic Eye Exam: Completed 03/12/20 Diabetic Foot Exam: Completed 03/06/20   Interpreter Needed?: No  Information entered by ::  Lanier Ensign, LPN   Activities of Daily Living In your present state of health, do you have any difficulty performing the following activities: 01/10/2021  Hearing? Y  Comment hearing aids  Vision? N  Difficulty concentrating or making decisions? N  Walking or climbing stairs? Y  Dressing or bathing? N  Doing errands, shopping? N  Preparing Food and eating ? N  Using the Toilet? N  In the past six months, have you accidently leaked urine? N  Do you have problems with loss of bowel control? N  Managing your Medications? N  Managing your Finances? N  Housekeeping or managing your Housekeeping? N  Some recent data might be hidden    Patient Care Team: [name], MD as PCP - General (Family Medicine) [name], [facility] as Pharmacist (Pharmacist)  Indicate any recent Medical Services you may have received from other than [facility] providers in the past year (date may be approximate).     Assessment:   This is a routine wellness examination for Ann Torres.  Hearing/Vision screen Hearing Screening - Comments:: Pt wears hearing aids Vision Screening - Comments:: Pt  follow up with [facility] for annual exams   Dietary issues and exercise activities discussed: Current Exercise Habits: The patient does not participate in regular exercise at present   Goals Addressed             This Visit's Progress    Patient Stated       None at this time       Depression Screen PHQ 2/9 Scores 01/10/2021 11/26/2020 08/04/2020 03/06/2020 12/06/2019 02/01/2019 08/21/2018  PHQ - 2 Score 0 0 0 0 0 0 0  PHQ- 9 Score - 1 0 - 0 - -    Fall Risk Fall Risk  01/10/2021 11/26/2020 08/04/2020 12/06/2019 02/01/2019  Falls in the past year? 0  Number falls in past yr: 1 1 0 1 0  Injury with Fall? 0 1 0 0 0  Risk for fall due to : Impaired balance/gait;Impaired mobility;Impaired vision History of fall(s);No Fall Risks No Fall Risks No Fall Risks -  Follow up Falls prevention discussed Falls prevention discussed;Falls evaluation completed Falls prevention discussed Falls prevention discussed -    FALL RISK PREVENTION PERTAINING TO THE HOME:  Any stairs in or around the home? Yes  If so, are there any without handrails? No  Home free of loose throw rugs in walkways, pet beds, electrical cords, etc? Yes  Adequate lighting in your home to reduce risk of falls? Yes   ASSISTIVE DEVICES UTILIZED TO PREVENT FALLS:  Life alert? Yes pull string inside apartment  Use of a cane, walker or w/c? Yes  Grab bars in the bathroom? Yes  Shower chair or bench in shower? No  Elevated toilet seat or a handicapped toilet? Yes   TIMED UP AND GO:  Was the test performed? No .   Cognitive Function: MMSE - Mini Mental State Exam 12/06/2019  Orientation to time 4  Orientation to Place 5  Registration 3  Attention/ Calculation 5  Recall 3  Language- name 2 objects 2  Language- repeat 1  Language- follow 3 step command 3  Language- read & follow direction 1  Write a sentence 1  Copy design 1  Total score 29     6CIT Screen 01/10/2021  What Year? 0 points  What month? 0  points  What time? 0 points  Count back from 20 0 points  Months in reverse 0 points  Repeat phrase 0 points  Total Score 0    Immunizations Immunization History  Administered Date(s) Administered   Influenza,inj,Quad PF,6+ Mos 02/01/2017, 01/24/2018, 01/15/2019, 01/31/2020   Janssen (J&J) SARS-COV-2 Vaccination 07/21/2019   [name](Purple Top)SARS-COV-2 Vaccination 02/20/2020, 08/05/2020   Pneumococcal Conjugate-13 03/06/2020   Tdap 07/01/2012    TDAP status: Up to date  Flu Vaccine status: Due, Education has been provided regarding the importance of this vaccine. Advised may receive this vaccine at local pharmacy or Health Dept. Aware to provide a copy of the vaccination record if obtained from local pharmacy or Health Dept. Verbalized acceptance and understanding.  Pneumococcal vaccine status: Due, Education has been provided regarding the importance of this vaccine. Advised may receive this vaccine at local pharmacy or Health Dept. Aware to provide a copy of the vaccination record if obtained from local pharmacy or Health Dept. Verbalized acceptance and understanding.  Covid-19 vaccine status: Completed vaccines  Qualifies for Shingles Vaccine? Yes   Zostavax completed No   Shingrix Completed?: No.    Education has been provided regarding the importance of this vaccine. Patient has been advised to call insurance company to determine out of pocket expense if they have not yet received this vaccine. Advised may also receive vaccine at local pharmacy or Health Dept. Verbalized acceptance and understanding.  Screening Tests Health Maintenance  Topic Date Due   Zoster Vaccines- Shingrix (1 of 2) Never done   INFLUENZA VACCINE  12/01/2020   COVID-19 Vaccine (4 - Booster for Janssen series) 12/05/2020   Hepatitis C Screening  03/04/2023 (Originally 10/11/1973)   FOOT EXAM  03/06/2021   PNA vac Low Risk Adult (2 of 2 - PPSV23) 03/06/2021   OPHTHALMOLOGY EXAM  03/12/2021   HEMOGLOBIN  A1C  05/29/2021   MAMMOGRAM  02/13/2022   TETANUS/TDAP  07/02/2022   COLONOSCOPY (Pts 45-30yrs Insurance coverage will need to be confirmed)  05/17/2024   DEXA SCAN  Completed   HPV VACCINES  Aged Out    Health Maintenance  Health Maintenance Due  Topic Date Due   Zoster Vaccines- Shingrix (1 of 2) Never done   INFLUENZA VACCINE  12/01/2020   COVID-19 Vaccine (4 - Booster for Janssen series) 12/05/2020    Colorectal cancer screening: Type of screening: Colonoscopy. Completed 05/18/19. Repeat every 5 years  Mammogram status: Completed 02/14/20. Repeat every year  Bone Density status: Completed 08/27/20. Results reflect: Bone density results: OSTEOPENIA. Repeat every 2 years.    Additional Screening:  Hepatitis C Screening: does qualify;  Vision Screening: Recommended annual ophthalmology exams for early detection of glaucoma and other disorders of the eye. Is the patient up to date with their annual eye exam?  Yes  Who is the provider or what is the name of the office in which the patient attends annual eye exams? [facility] If pt is not established with a provider, would they like to be referred to a provider to establish care? No .   Dental Screening: Recommended annual dental exams for proper oral hygiene  Community Resource Referral / Chronic Care Management: CRR required this visit?  No   CCM required this visit?  No      Plan:     I have personally reviewed and noted the following in the patient's chart:   Medical and social history Use of alcohol, tobacco or illicit drugs  Current medications and supplements including opioid prescriptions. Patient is not currently taking opioid prescriptions. Functional ability and status Nutritional  status Physical activity Advanced directives List of other physicians Hospitalizations, surgeries, and ER visits in previous 12 months Vitals Screenings to include cognitive, depression, and falls Referrals and  appointments  In addition, I have reviewed and discussed with patient certain preventive protocols, quality metrics, and best practice recommendations. A written personalized care plan for preventive services as well as general preventive health recommendations were provided to patient.     Marzella Schlein, LPN   [id number]/78/[id number]   Nurse Notes: None

## 2021-01-11 DIAGNOSIS — E119 Type 2 diabetes mellitus without complications: Secondary | ICD-10-CM | POA: Diagnosis not present

## 2021-01-12 ENCOUNTER — Ambulatory Visit (INDEPENDENT_AMBULATORY_CARE_PROVIDER_SITE_OTHER): Payer: Medicare Other | Admitting: Sports Medicine

## 2021-01-12 ENCOUNTER — Ambulatory Visit (INDEPENDENT_AMBULATORY_CARE_PROVIDER_SITE_OTHER): Payer: Medicare Other

## 2021-01-12 ENCOUNTER — Other Ambulatory Visit: Payer: Self-pay | Admitting: Family Medicine

## 2021-01-12 ENCOUNTER — Other Ambulatory Visit: Payer: Self-pay

## 2021-01-12 DIAGNOSIS — M545 Low back pain, unspecified: Secondary | ICD-10-CM

## 2021-01-12 DIAGNOSIS — M5416 Radiculopathy, lumbar region: Secondary | ICD-10-CM

## 2021-01-12 DIAGNOSIS — Z1231 Encounter for screening mammogram for malignant neoplasm of breast: Secondary | ICD-10-CM

## 2021-01-12 DIAGNOSIS — M48061 Spinal stenosis, lumbar region without neurogenic claudication: Secondary | ICD-10-CM | POA: Insufficient documentation

## 2021-01-12 MED ORDER — NAPROXEN 500 MG PO TABS: 500.0000 mg | ORAL_TABLET | Freq: Two times a day (BID) | ORAL | 3 refills | Status: DC

## 2021-01-12 MED ORDER — CYCLOBENZAPRINE HCL 10 MG PO TABS: ORAL_TABLET | ORAL | 0 refills | Status: DC

## 2021-01-12 NOTE — Assessment & Plan Note (Signed)
Ann Torres is a pleasant 65 year old female, she has had several week history of pain in her low back with radiation down the back of the right leg, to the posterior lateral right lower leg but not quite to the foot, no red flag symptoms. Worse with sitting, flexion, Valsalva. Suspect lumbar radiculitis, discogenic axial back pain. Adding physical therapy, naproxen, Flexeril at night. Baseline x-rays. Return to see me in approximately 6 weeks, MRI for interventional planning if no better. We did go over the evolutionary anthropology of disc disease.

## 2021-01-12 NOTE — Progress Notes (Signed)
    Procedures performed today:    None.  Independent interpretation of notes and tests performed by another provider:   None.  Brief History, Exam, Impression, and Recommendations:    Right lumbar radiculitis Dessie is a pleasant 65 year old female, she has had several week history of pain in her low back with radiation down the back of the right leg, to the posterior lateral right lower leg but not quite to the foot, no red flag symptoms. Worse with sitting, flexion, Valsalva. Suspect lumbar radiculitis, discogenic axial back pain. Adding physical therapy, naproxen, Flexeril at night. Baseline x-rays. Return to see me in approximately 6 weeks, MRI for interventional planning if no better. We did go over the evolutionary anthropology of disc disease.    ___________________________________________ Ihor Austin. Benjamin Stain, M.D., ABFM., CAQSM. Primary Care and Sports Medicine [facility]  Adjunct Instructor of Family Medicine  [employer]

## 2021-01-14 ENCOUNTER — Other Ambulatory Visit: Payer: Self-pay

## 2021-01-14 ENCOUNTER — Ambulatory Visit (INDEPENDENT_AMBULATORY_CARE_PROVIDER_SITE_OTHER): Payer: Medicare Other | Admitting: Physical Therapy

## 2021-01-14 ENCOUNTER — Encounter: Payer: Self-pay | Admitting: Physical Therapy

## 2021-01-14 DIAGNOSIS — R29898 Other symptoms and signs involving the musculoskeletal system: Secondary | ICD-10-CM | POA: Diagnosis not present

## 2021-01-14 DIAGNOSIS — M6281 Muscle weakness (generalized): Secondary | ICD-10-CM | POA: Diagnosis not present

## 2021-01-14 DIAGNOSIS — R262 Difficulty in walking, not elsewhere classified: Secondary | ICD-10-CM

## 2021-01-14 DIAGNOSIS — M5440 Lumbago with sciatica, unspecified side: Secondary | ICD-10-CM | POA: Diagnosis not present

## 2021-01-14 NOTE — Therapy (Signed)
[facility] [place] [address] [place], [place], [address] Phone: [phone]   Fax:  [phone]  Physical Therapy Evaluation  Patient Details  Name: Ann Torres MRN: [id number] Date of Birth: 1955/10/31 Referring Provider (PT): Thekkekandam   Encounter Date: 01/14/2021   PT End of Session - 01/14/21 0940     Visit Number 1    Number of Visits 12    Date for PT Re-Evaluation 02/25/21    Authorization - Visit Number 1    Progress Note Due on Visit 10    PT Start Time 0845    PT Stop Time 0930    PT Time Calculation (min) 45 min    Activity Tolerance Patient tolerated treatment well    Behavior During Therapy [facility] for tasks assessed/performed             History reviewed. No pertinent past medical history.  Past Surgical History:  Procedure Laterality Date   BILATERAL OOPHORECTOMY     BREAST BIOPSY     in pts 30s   BREAST CYST EXCISION     CESAREAN SECTION     x 3   CHOLECYSTECTOMY     TOTAL ABDOMINAL HYSTERECTOMY     TUBAL LIGATION      There were no vitals filed for this visit.    Subjective Assessment - 01/14/21 0851     Subjective Pt has been having radicular symptoms x 1 year. She initially came to PT and had some relief but after finishing PT pain got worse. Pt then had steroids from MD which decreased symptoms for a while. In the last month pt has been in "excrutiating" pain and was "immobile" for 2 weeks. Pt returned to MD last week who prescribed meds and PT. Pt reports pain is worse in Rt low back and occasionally goes to Lt side of low back. Pain travels down posterior Rt LE to knee. Pain increases with twisting, squatting, and lifting, decreases with lumbar flexion, meds.    Pertinent History rheumatoid arthritis    Limitations Walking;Standing    How long can you stand comfortably? 5 min    How long can you walk comfortably? 5 min    Diagnostic tests x ray shows mild degenerative changes L5-S1     Patient Stated Goals decrease pain    Currently in Pain? Yes    Pain Score 9     Pain Location Back    Pain Orientation Right    Pain Descriptors / Indicators Sharp;Throbbing    Pain Type Chronic pain    Pain Radiating Towards Rt LE    Pain Onset 1 to 4 weeks ago    Pain Frequency Constant    Aggravating Factors  bend, lift, twist    Pain Relieving Factors forward flexion                [facility] PT Assessment - 01/14/21 0001       Assessment   Medical Diagnosis Rt Lumbar radiculopathy    Referring Provider (PT) Thekkekandam    Next MD Visit 02/23/21    Prior Therapy yes for low back and hip pain 1 year ago      Precautions   Precautions None      Balance Screen   Has the patient fallen in the past 6 months Yes    How many times? 2    Has the patient had a decrease in activity level because of a fear of falling?  Yes  Is the patient reluctant to leave their home because of a fear of falling?  No      Prior Function   Level of Independence Independent      Observation/Other Assessments   Focus on Therapeutic Outcomes (FOTO)  40      ROM / Strength   AROM / PROM / Strength AROM;Strength      AROM   Overall AROM Comments pain with all lumbar mobility    AROM Assessment Site Lumbar    Lumbar Flexion limited 75%    Lumbar Extension unable to fully extend to neutral    Lumbar - Right Side Bend limited 75%    Lumbar - Left Side Bend limited 75%    Lumbar - Right Rotation limited 75%    Lumbar - Left Rotation limibed 75%      Strength   Overall Strength Comments bilat hip strength grossly 3/5 limited by pain      Flexibility   Soft Tissue Assessment /Muscle Length yes    Hamstrings decreased bilat    Piriformis decreased bilat      Palpation   Spinal mobility hypomobile L4-L5 with CPAs    SI assessment  hypomobile CPAs UPAs SIJ    Palpation comment TTP Rt lumbar paraspinals, Rt glutes and QL      Special Tests   Other special tests SLR + on Rt LE       Transfers   Comments pt requires use of UEs to stand from chair      Ambulation/Gait   Assistive device None    Gait Pattern Decreased stride length;Trunk flexed                        Objective measurements completed on examination: See above findings.       [facility] Adult PT Treatment/Exercise - 01/14/21 0001       Exercises   Exercises Lumbar      Lumbar Exercises: Seated   Other Seated Lumbar Exercises forward flexion and lateral flexion with UEs on physioball x 5 each direction      Lumbar Exercises: Supine   Heel Slides 5 reps    Other Supine Lumbar Exercises lower trunk rotaiton in tolerable range x 10      Modalities   Modalities Electrical Stimulation      Electrical Stimulation   Electrical Stimulation Location lumbar    Electrical Stimulation Action TENS    Electrical Stimulation Parameters to tolerance    Electrical Stimulation Goals Pain                     PT Education - 01/14/21 [id number]     Education Details PT POC and goals, HEP    Person(s) Educated Patient    Methods Explanation;Demonstration;Handout    Comprehension Verbalized understanding;Returned demonstration                 PT Long Term Goals - 01/14/21 0945       PT LONG TERM GOAL #1   Title Pt will be independent with HEP    Time 6    Period Weeks    Status New    Target Date 02/25/21      PT LONG TERM GOAL #2   Title Pt will improve FOTO to >=59 to demo improved functional mobility6    Time 6    Period Weeks    Status New    Target Date 02/25/21  PT LONG TERM GOAL #3   Title Pt will improve LE strength to 4/5 to improve functional activity tolerance and transfers    Time 6    Period Weeks    Status New    Target Date 02/25/21      PT LONG TERM GOAL #4   Title Pt will improve lumbar ROM to <= 25% limited in all directions to decrease pain with transitional movements    Time 6    Period Weeks    Status New    Target Date 02/25/21       PT LONG TERM GOAL #5   Title Pt will tolerate standing x 10 minutes with pain <= 2/10 to improve ability to perform IADLs    Time 6    Period Weeks    Status New    Target Date 02/25/21                    Plan - 01/14/21 0941     Clinical Impression Statement Pt is a 65 y/o female referred for lumbar radiculopathy. Pt presents with increased pain, decreased ROM, mobility and strength and impaired gait and balance. Pt will benefit from skilled PT to address deficits and improve functional mobility    Personal Factors and Comorbidities Age;Comorbidity 3+;Past/Current Experience;Time since onset of injury/illness/exacerbation    Examination-Activity Limitations Lift;Squat;Locomotion Level;Bend;Stand;Sleep    Examination-Participation Restrictions Cleaning;Community Activity;Yard Work    Conservation officer, historic buildings Stable/Uncomplicated    Optometrist Low    Rehab Potential Good    PT Frequency 2x / week    PT Duration 6 weeks    PT Treatment/Interventions Aquatic Therapy;Cryotherapy;Moist Heat;Iontophoresis 4mg /ml Dexamethasone;Traction;Electrical Stimulation;Gait training;Stair training;Neuromuscular re-education;Balance training;Therapeutic exercise;Therapeutic activities;DME Instruction;Taping;Patient/family education;Manual techniques;Dry needling    PT Next Visit Plan assess HEP, progress flexibility and core and LE strength as tolerated    PT Home Exercise Plan [email or web address]    Consulted and Agree with Plan of Care Patient             Patient will benefit from skilled therapeutic intervention in order to improve the following deficits and impairments:  Pain, Postural dysfunction, Decreased strength, Decreased activity tolerance, Decreased range of motion, Increased muscle spasms, Difficulty walking, Decreased balance, Decreased mobility, Decreased endurance  Visit Diagnosis: Acute right-sided low back pain with sciatica, sciatica laterality unspecified -  Plan: PT plan of care cert/re-cert  Muscle weakness (generalized) - Plan: PT plan of care cert/re-cert  Other symptoms and signs involving the musculoskeletal system - Plan: PT plan of care cert/re-cert  Difficulty in walking, not elsewhere classified - Plan: PT plan of care cert/re-cert     Problem List Patient Active Problem List   Diagnosis Date Noted   Right lumbar radiculitis 01/12/2021   Osteopenia 08/27/2020   Allergy to honey bee venom 08/04/2020   Memory impairment 12/06/2019   Trigger finger 08/07/2019   Primary insomnia 03/23/2018   Long-term use of high-risk medication 02/06/2018   Calcific Achilles tendinitis of left lower extremity 10/06/2017   Haglund's deformity of left heel 10/06/2017   Essential hypertension 02/01/2017   Rheumatoid arthritis involving multiple sites (HCC) 02/01/2017   Diabetes mellitus without complication (HCC) 02/01/2017   History of colon polyps 02/01/2017   Depression, major, single episode, complete remission (HCC) 02/01/2017   Hyperlipidemia 02/01/2017    Ann Torres, PT 01/14/2021, 9:54 AM  [facility] [address] [place], [place], [place] Phone: [phone]   Fax:  [phone]  Name:  Ann Torres MRN: [id number] Date of Birth: 09-02-1955

## 2021-01-14 NOTE — Patient Instructions (Addendum)
Access Code: [email or web address] URL: [email or web address] Date: 01/14/2021 Prepared by: Reggy Eye  Exercises Seated Flexion Stretch with Swiss Ball - 1 x daily - 7 x weekly - 3 sets - 10 reps Seated Thoracic Flexion and Rotation with Swiss Ball - 1 x daily - 7 x weekly - 3 sets - 10 reps Supine Heel Slides - 1 x daily - 7 x weekly - 1 sets - 10 reps Supine Lower Trunk Rotation - 1 x daily - 7 x weekly - 1 sets - 10 reps   Aquatic Therapy: What to Expect!  Where:  [facility] [address] [place], [place] [address] [phone]           How to Prepare:  If you require assistance with dressing, with transportation (ie: wheel chair), or toileting, a caregiver must attend the entire session with you (unless your primary therapists feels this is not necessary).   If there is thunder during your appointment, you will be asked to leave pool area. You have the option to finish your session in the physical therapy area near the gym. Masks in the pool area are optional. Your face will remain dry during your session, so you are welcome to keep your mask on, if desired. You will be spaced at least 6 feet from other aquatic patients.  Please bring your own swim towel to dry off with.   There are Men's and Women's locker rooms with showers, as well as gender neutral bathrooms in the pool area.  Please arrive IN YOUR SUIT and a few minutes prior to your appointment - this helps to avoid delays in starting your session. Head to the pool and await your appointment on the bench on the pool deck. Please make sure to attend to any toileting needs prior to entering the pool. Once on the pool deck your therapist will ask you to sign the Patient  Consent and Assignment of Benefits form. Your therapist may take your blood pressure prior to, during and after your session if indicated. We usually try and create a home exercise program based on activities we do in  the pool. Some patients do not want to or do not have the ability to participate in an aquatic home program - this is not a barrier in any way to you participating in aquatic therapy as part of your current therapy plan!  Appointments:  All sessions are 45 minutes  About the pool: Entering the pool: Your therapist will assist you if needed; there are two ways to enter the pool - stairs or a mechanical lift. Your therapist will determine the most appropriate way for you. Water temperature is usually around 91-95.  There is a lap pool with a temperature around 84 There may be other therapists and patients in the pool at the same time.   Contact Info:            To cancel appointment, please call [facility] at [phone] If you are running late, please call [facility] at [phone]

## 2021-01-16 ENCOUNTER — Other Ambulatory Visit: Payer: Self-pay

## 2021-01-16 ENCOUNTER — Encounter: Payer: Self-pay | Admitting: Physical Therapy

## 2021-01-16 ENCOUNTER — Ambulatory Visit (INDEPENDENT_AMBULATORY_CARE_PROVIDER_SITE_OTHER): Payer: Medicare Other | Admitting: Physical Therapy

## 2021-01-16 DIAGNOSIS — M5440 Lumbago with sciatica, unspecified side: Secondary | ICD-10-CM

## 2021-01-16 DIAGNOSIS — R262 Difficulty in walking, not elsewhere classified: Secondary | ICD-10-CM | POA: Diagnosis not present

## 2021-01-16 DIAGNOSIS — R29898 Other symptoms and signs involving the musculoskeletal system: Secondary | ICD-10-CM

## 2021-01-16 DIAGNOSIS — M6281 Muscle weakness (generalized): Secondary | ICD-10-CM | POA: Diagnosis not present

## 2021-01-16 NOTE — Therapy (Signed)
[facility] [place] [address] [place], [place], [address] Phone: [phone]   Fax:  [phone]  Physical Therapy Treatment  Patient Details  Name: Ann Torres MRN: [id number] Date of Birth: 11-Oct-1955 Referring Provider (PT): Thekkekandam   Encounter Date: 01/16/2021   PT End of Session - 01/16/21 1328     Visit Number 2    Number of Visits 12    Date for PT Re-Evaluation 02/25/21    Authorization - Visit Number 2    Progress Note Due on Visit 10    PT Start Time 1317    PT Stop Time 1400    PT Time Calculation (min) 43 min    Activity Tolerance Patient tolerated treatment well    Behavior During Therapy [facility] for tasks assessed/performed             History reviewed. No pertinent past medical history.  Past Surgical History:  Procedure Laterality Date   BILATERAL OOPHORECTOMY     BREAST BIOPSY     in pts 30s   BREAST CYST EXCISION     CESAREAN SECTION     x 3   CHOLECYSTECTOMY     TOTAL ABDOMINAL HYSTERECTOMY     TUBAL LIGATION      There were no vitals filed for this visit.   Subjective Assessment - 01/16/21 1334     Subjective Pt reports that she bought a therapy ball and made her way through the exercises given by Clydie Braun. She continues to do some of the exercises from previous episode of PT.    Pertinent History rheumatoid arthritis    Limitations Walking;Standing    How long can you stand comfortably? 5 min    How long can you walk comfortably? 5 min    Diagnostic tests x ray shows mild degenerative changes L5-S1    Patient Stated Goals decrease pain    Currently in Pain? Yes    Pain Score 5     Pain Location Back    Pain Orientation Right    Pain Descriptors / Indicators Throbbing;Sharp    Pain Radiating Towards into Rt hip    Pain Onset 1 to 4 weeks ago             Pt seen for aquatic therapy today.  Treatment took place in water 3.25-4 ft in depth at the [facility] pool.  Temp of water was 95.  Pt entered/exited the pool via stairs independently with bilat rail.  Treatment:   Holding pool noodle: gait forward / backward/ side stepping. Side step squat. Heel/toe raises. In deep end with noodle under armpits - front float  - progressed to front float to knee tuck and stand (numerous reps).  High knee marching ( stopped due to increased pain).   Row holding blue floatation dumbbells x 15  Ai Chi postures: Uplifting , closing, soothing.   Holding side of pool: Hip abdct x 10, 2 sets, hip ext x 10.    At pool bench:  sit to/from stand with core engaged x 10.  Piriformis stretch x 10 sec x 2 reps each side (modified pigeon)   Pt requires buoyancy for support and to offload joints with strengthening exercises. Viscosity of the water is needed for resistance of strengthening; water current perturbations provides challenge to standing balance unsupported, requiring increased core activation.    PT Long Term Goals - 01/14/21 0945       PT LONG TERM GOAL #1  Title Pt will be independent with HEP    Time 6    Period Weeks    Status New    Target Date 02/25/21      PT LONG TERM GOAL #2   Title Pt will improve FOTO to >=59 to demo improved functional mobility6    Time 6    Period Weeks    Status New    Target Date 02/25/21      PT LONG TERM GOAL #3   Title Pt will improve LE strength to 4/5 to improve functional activity tolerance and transfers    Time 6    Period Weeks    Status New    Target Date 02/25/21      PT LONG TERM GOAL #4   Title Pt will improve lumbar ROM to <= 25% limited in all directions to decrease pain with transitional movements    Time 6    Period Weeks    Status New    Target Date 02/25/21      PT LONG TERM GOAL #5   Title Pt will tolerate standing x 10 minutes with pain <= 2/10 to improve ability to perform IADLs    Time 6    Period Weeks    Status New    Target Date 02/25/21                   Plan - 01/16/21  1331     Clinical Impression Statement Pt had difficulty bearing weight into RLE until she was submerged to mid-sternum.  She reported less throbbing pain once she was exercising ~10 min.  She was unable to tolerate high knee marching today, but tolerated all other exercises well. Of note, she reported Rt buttock pain when falling towards a back float (flexion of lumbar spine/hip flexion) that was relieved with change in position to front supported float (neutral/ext of lumbar).  She reported slight reduction of pain by end of session - to 4/10.  Goals are ongoing.    Personal Factors and Comorbidities Age;Comorbidity 3+;Past/Current Experience;Time since onset of injury/illness/exacerbation    Examination-Activity Limitations Lift;Squat;Locomotion Level;Bend;Stand;Sleep    Examination-Participation Restrictions Cleaning;Community Activity;Yard Work    Stability/Clinical Decision Making Stable/Uncomplicated    Rehab Potential Good    PT Frequency 2x / week    PT Duration 6 weeks    PT Treatment/Interventions Aquatic Therapy;Cryotherapy;Moist Heat;Iontophoresis 4mg /ml Dexamethasone;Traction;Electrical Stimulation;Gait training;Stair training;Neuromuscular re-education;Balance training;Therapeutic exercise;Therapeutic activities;DME Instruction;Taping;Patient/family education;Manual techniques;Dry needling    PT Next Visit Plan assess HEP, progress flexibility and core and LE strength as tolerated. back care education.    PT Home Exercise Plan [email or web address]    Consulted and Agree with Plan of Care Patient             Patient will benefit from skilled therapeutic intervention in order to improve the following deficits and impairments:  Pain, Postural dysfunction, Decreased strength, Decreased activity tolerance, Decreased range of motion, Increased muscle spasms, Difficulty walking, Decreased balance, Decreased mobility, Decreased endurance  Visit Diagnosis: Acute right-sided low back pain with  sciatica, sciatica laterality unspecified  Muscle weakness (generalized)  Other symptoms and signs involving the musculoskeletal system  Difficulty in walking, not elsewhere classified     Problem List Patient Active Problem List   Diagnosis Date Noted   Right lumbar radiculitis 01/12/2021   Osteopenia 08/27/2020   Allergy to honey bee venom 08/04/2020   Memory impairment 12/06/2019   Trigger finger 08/07/2019   Primary insomnia 03/23/2018   Long-term  use of high-risk medication 02/06/2018   Calcific Achilles tendinitis of left lower extremity 10/06/2017   Haglund's deformity of left heel 10/06/2017   Essential hypertension 02/01/2017   Rheumatoid arthritis involving multiple sites (HCC) 02/01/2017   Diabetes mellitus without complication (HCC) 02/01/2017   History of colon polyps 02/01/2017   Depression, major, single episode, complete remission (HCC) 02/01/2017   Hyperlipidemia 02/01/2017   Mayer Camel, PTA 01/16/21 3:28 PM   [facility] [place] [address] [place], [place], [address] Phone: [phone]   Fax:  [phone]  Name: PIPER HASSEBROCK MRN: [id number] Date of Birth: Jan 25, 1956

## 2021-01-19 ENCOUNTER — Other Ambulatory Visit: Payer: Self-pay

## 2021-01-19 ENCOUNTER — Ambulatory Visit (INDEPENDENT_AMBULATORY_CARE_PROVIDER_SITE_OTHER): Payer: Medicare Other | Admitting: Physical Therapy

## 2021-01-19 DIAGNOSIS — M6281 Muscle weakness (generalized): Secondary | ICD-10-CM | POA: Diagnosis not present

## 2021-01-19 DIAGNOSIS — M5440 Lumbago with sciatica, unspecified side: Secondary | ICD-10-CM

## 2021-01-19 DIAGNOSIS — R29898 Other symptoms and signs involving the musculoskeletal system: Secondary | ICD-10-CM | POA: Diagnosis not present

## 2021-01-19 NOTE — Patient Instructions (Signed)
Access Code: [email or web address] URL: [email or web address] Date: 01/19/2021 Prepared by: Reggy Eye  Exercises Seated Flexion Stretch with Swiss Ball - 1 x daily - 7 x weekly - 3 sets - 10 reps Seated Thoracic Flexion and Rotation with Swiss Ball - 1 x daily - 7 x weekly - 3 sets - 10 reps Supine Heel Slides - 1 x daily - 7 x weekly - 1 sets - 10 reps Supine Lower Trunk Rotation - 1 x daily - 7 x weekly - 1 sets - 10 reps  Patient Education Trigger Point Dry Needling

## 2021-01-19 NOTE — Therapy (Signed)
[facility] [place] [address] [place], [place], [address] Phone: [phone]   Fax:  [phone]  Physical Therapy Treatment  Patient Details  Name: Ann Torres MRN: [id number] Date of Birth: 1955-06-16 Referring Provider (PT): Thekkekandam   Encounter Date: 01/19/2021   PT End of Session - 01/19/21 1218     Visit Number 3    Number of Visits 12    Date for PT Re-Evaluation 02/25/21    Authorization - Visit Number 3    Progress Note Due on Visit 10    PT Start Time 1135    PT Stop Time 1215    PT Time Calculation (min) 40 min    Activity Tolerance Patient tolerated treatment well    Behavior During Therapy [facility] for tasks assessed/performed             No past medical history on file.  Past Surgical History:  Procedure Laterality Date   BILATERAL OOPHORECTOMY     BREAST BIOPSY     in pts 30s   BREAST CYST EXCISION     CESAREAN SECTION     x 3   CHOLECYSTECTOMY     TOTAL ABDOMINAL HYSTERECTOMY     TUBAL LIGATION      There were no vitals filed for this visit.   Subjective Assessment - 01/19/21 1138     Subjective Pt reports she is "on day 5 of muscle relaxers" and that she feels "much better". She was able to climb 6 flights of stairs yesterday    Patient Stated Goals decrease pain    Currently in Pain? Yes    Pain Score 3     Pain Location Back    Pain Orientation Right    Pain Descriptors / Indicators Sore    Pain Type Chronic pain                [facility] PT Assessment - 01/19/21 0001       Assessment   Medical Diagnosis Rt Lumbar radiculopathy    Referring Provider (PT) Thekkekandam    Next MD Visit 02/23/21    Prior Therapy yes for low back and hip pain 1 year ago      AROM   Lumbar Extension to neutral      Palpation   Palpation comment TTP Rt lumbar paraspinals, Rt glutes and QL                           [facility] Adult PT Treatment/Exercise - 01/19/21 0001        Lumbar Exercises: Stretches   Passive Hamstring Stretch Right;Left;2 reps;30 seconds    Passive Hamstring Stretch Limitations performed by PT this visit      Lumbar Exercises: Aerobic   Nustep L5 x 5 min for warm up      Lumbar Exercises: Seated   Other Seated Lumbar Exercises forward flexion and lateral flexion with UEs on physioball x 10 each direction    Other Seated Lumbar Exercises seated on physioball pelvic tilts CW/CCW, A/P, laterally all x 10      Lumbar Exercises: Supine   Heel Slides 10 reps    Heel Slides Limitations bilat    Bridge Limitations unable due to pain    Other Supine Lumbar Exercises LTR tolerable range x 1 min      Lumbar Exercises: Sidelying   Clam 15 reps      Modalities   Modalities --  pt to use TENS at home     Manual Therapy   Manual Therapy Joint mobilization;Soft tissue mobilization    Manual therapy comments skilled palpation to assess effects of dry needling    Joint Mobilization SIJ CPAs and UPAs grade 2-3 to improve mobility and reduce pain    Soft tissue mobilization STM Rt glutes and piriformis              Trigger Point Dry Needling - 01/19/21 0001     Consent Given? Yes    Education Handout Provided Yes    Muscles Treated Back/Hip Gluteus medius;Gluteus maximus;Piriformis    Gluteus Medius Response Twitch response elicited    Gluteus Maximus Response Palpable increased muscle length    Piriformis Response Palpable increased muscle length                   PT Education - 01/19/21 1217     Education Details dry needling    Person(s) Educated Patient    Methods Explanation;Demonstration;Handout    Comprehension Verbalized understanding                 PT Long Term Goals - 01/14/21 0945       PT LONG TERM GOAL #1   Title Pt will be independent with HEP    Time 6    Period Weeks    Status New    Target Date 02/25/21      PT LONG TERM GOAL #2   Title Pt will improve FOTO to >=59 to demo improved  functional mobility6    Time 6    Period Weeks    Status New    Target Date 02/25/21      PT LONG TERM GOAL #3   Title Pt will improve LE strength to 4/5 to improve functional activity tolerance and transfers    Time 6    Period Weeks    Status New    Target Date 02/25/21      PT LONG TERM GOAL #4   Title Pt will improve lumbar ROM to <= 25% limited in all directions to decrease pain with transitional movements    Time 6    Period Weeks    Status New    Target Date 02/25/21      PT LONG TERM GOAL #5   Title Pt will tolerate standing x 10 minutes with pain <= 2/10 to improve ability to perform IADLs    Time 6    Period Weeks    Status New    Target Date 02/25/21                   Plan - 01/19/21 1218     Clinical Impression Statement Pt with improved posture this visit, able to tolerate more exercise and stretching. Trial of dry needling to glutes and piriformis to reduce mm spasticity and pain, pt with good response.    PT Next Visit Plan core strength, progress HEP    PT Home Exercise Plan [email or web address]    Consulted and Agree with Plan of Care Patient             Patient will benefit from skilled therapeutic intervention in order to improve the following deficits and impairments:     Visit Diagnosis: Acute right-sided low back pain with sciatica, sciatica laterality unspecified  Muscle weakness (generalized)  Other symptoms and signs involving the musculoskeletal system     Problem List Patient Active Problem List  Diagnosis Date Noted   Right lumbar radiculitis 01/12/2021   Osteopenia 08/27/2020   Allergy to honey bee venom 08/04/2020   Memory impairment 12/06/2019   Trigger finger 08/07/2019   Primary insomnia 03/23/2018   Long-term use of high-risk medication 02/06/2018   Calcific Achilles tendinitis of left lower extremity 10/06/2017   Haglund's deformity of left heel 10/06/2017   Essential hypertension 02/01/2017   Rheumatoid arthritis  involving multiple sites (HCC) 02/01/2017   Diabetes mellitus without complication (HCC) 02/01/2017   History of colon polyps 02/01/2017   Depression, major, single episode, complete remission (HCC) 02/01/2017   Hyperlipidemia 02/01/2017    Lucy Boardman, PT 01/19/2021, 12:20 PM  [facility] [address] [place], [place], [address] Phone: [phone]   Fax:  [phone]  Name: Ann Torres MRN: [id number] Date of Birth: 11-06-1955

## 2021-01-22 ENCOUNTER — Encounter: Payer: Medicare Other | Admitting: Physical Therapy

## 2021-01-23 ENCOUNTER — Ambulatory Visit: Payer: Medicare Other | Admitting: Physical Therapy

## 2021-01-26 ENCOUNTER — Other Ambulatory Visit: Payer: Self-pay

## 2021-01-26 ENCOUNTER — Ambulatory Visit (INDEPENDENT_AMBULATORY_CARE_PROVIDER_SITE_OTHER): Payer: Medicare Other | Admitting: Physical Therapy

## 2021-01-26 DIAGNOSIS — M6281 Muscle weakness (generalized): Secondary | ICD-10-CM | POA: Diagnosis not present

## 2021-01-26 DIAGNOSIS — R29898 Other symptoms and signs involving the musculoskeletal system: Secondary | ICD-10-CM | POA: Diagnosis not present

## 2021-01-26 DIAGNOSIS — M5440 Lumbago with sciatica, unspecified side: Secondary | ICD-10-CM

## 2021-01-26 DIAGNOSIS — R262 Difficulty in walking, not elsewhere classified: Secondary | ICD-10-CM | POA: Diagnosis not present

## 2021-01-26 NOTE — Patient Instructions (Signed)
Access Code: [email or web address] URL: [email or web address] Date: 01/26/2021 Prepared by: [facility] [place]  Exercises Lying Prone - 1-2 x daily - 7 x weekly - 1 sets Hooklying Hamstring Stretch with Strap - 2 x daily - 7 x weekly - 1 sets - 2-3 reps - 15-45 seconds hold Seated Table Piriformis Stretch - 2 x daily - 7 x weekly - 1 sets - 2-3 reps - 15-45 seconds hold Seated Hip Flexor Stretch - 2 x daily - 7 x weekly - 1 sets - 2-3 reps - 15-45 seconds hold Sit to Stand with Arm Swing - 7 x weekly - 2 sets - 5 reps

## 2021-01-26 NOTE — Therapy (Signed)
[facility] [place] [address] [place], [place], [address] Phone: [phone]   Fax:  [phone]  Physical Therapy Treatment  Patient Details  Name: Ann Torres MRN: [id number] Date of Birth: 03-17-1956 Referring Provider (PT): Thekkekandam   Encounter Date: 01/26/2021   PT End of Session - 01/26/21 1009     Visit Number 4    Number of Visits 12    Date for PT Re-Evaluation 02/25/21    Authorization - Visit Number 4    Progress Note Due on Visit 10    PT Start Time 1010    PT Stop Time 1102    PT Time Calculation (min) 52 min    Activity Tolerance Patient tolerated treatment well    Behavior During Therapy [facility] for tasks assessed/performed             No past medical history on file.  Past Surgical History:  Procedure Laterality Date   BILATERAL OOPHORECTOMY     BREAST BIOPSY     in pts 30s   BREAST CYST EXCISION     CESAREAN SECTION     x 3   CHOLECYSTECTOMY     TOTAL ABDOMINAL HYSTERECTOMY     TUBAL LIGATION      There were no vitals filed for this visit.   Subjective Assessment - 01/26/21 1017     Subjective Pt reports she was very sore after the DN last week (7-8/10).  Laying on her stomach gives her most relief. "I'm getting discouraged".  She no longer has any muscle relaxers, so she is not sleeping as well.    Pertinent History rheumatoid arthritis    Limitations Walking;Standing    How long can you stand comfortably? 5 min    Diagnostic tests x ray shows mild degenerative changes L5-S1    Patient Stated Goals decrease pain    Currently in Pain? Yes    Pain Score 5     Pain Location Back    Pain Orientation Right;Left;Lower    Pain Descriptors / Indicators Sore    Pain Radiating Towards into Rt hip.    Aggravating Factors  bending, twisting, lifting, long car rides    Pain Relieving Factors laying on stomach                [facility] PT Assessment - 01/26/21 0001       Assessment    Medical Diagnosis Rt Lumbar radiculopathy    Referring Provider (PT) Benjamin Stain    Next MD Visit 02/23/21    Prior Therapy yes for low back and hip pain 1 year ago               [facility] Adult PT Treatment/Exercise - 01/26/21 0001       Lumbar Exercises: Stretches   Passive Hamstring Stretch Right;20 seconds;3 reps;Left;2 reps    Passive Hamstring Stretch Limitations hooklying with strap    Hip Flexor Stretch Right;3 reps;Left;1 rep;30 seconds    Quad Stretch Right;Left;2 reps;20 seconds   prone with strap   Quad Stretch Limitations Rt stretch only hurt in back, no stretch felt in quad.    Piriformis Stretch Right;2 reps;Left;1 rep;30 seconds;60 seconds    Piriformis Stretch Limitations modified pigeon pose      Lumbar Exercises: Aerobic   Nustep L4: 4.5 min for warm up (limited tolerance with RLE extended knee)    Other Aerobic Exercise single laps in between exercises to decrease stiffness.      Lumbar  Exercises: Standing   Other Standing Lumbar Exercises Lt SLS with Rt hip extended for stretch x 20 sec x 2.  then leg lifts (RLE, x 5 )    Other Standing Lumbar Exercises trunk ext holding elevated table and leaning hips forward x 5 sec x 5 reps (limited tolerance if too much extension)      Lumbar Exercises: Seated   Sit to Stand 5 reps   cues for hip hinge and straight back; improved with repetition     Lumbar Exercises: Prone   Opposite Arm/Leg Raise Right arm/Left leg;Left arm/Right leg;5 reps;2 seconds   LLE with TKE, toes tucked.  unable to tolerate lifting whole leg (increased Rt SI pain)   Other Prone Lumbar Exercises prone position (propped on pillow) for neutral/ext in lumbar spine      Manual Therapy   Soft tissue mobilization STM Rt glutes and piriformis                     PT Education - 01/26/21 1208     Education Details modified HEP to neutral/ext based.  issued red band for hooklying clams.    Person(s) Educated Patient    Methods  Explanation;Demonstration;Tactile cues;Verbal cues;Handout    Comprehension Verbalized understanding                 PT Long Term Goals - 01/14/21 0945       PT LONG TERM GOAL #1   Title Pt will be independent with HEP    Time 6    Period Weeks    Status New    Target Date 02/25/21      PT LONG TERM GOAL #2   Title Pt will improve FOTO to >=59 to demo improved functional mobility6    Time 6    Period Weeks    Status New    Target Date 02/25/21      PT LONG TERM GOAL #3   Title Pt will improve LE strength to 4/5 to improve functional activity tolerance and transfers    Time 6    Period Weeks    Status New    Target Date 02/25/21      PT LONG TERM GOAL #4   Title Pt will improve lumbar ROM to <= 25% limited in all directions to decrease pain with transitional movements    Time 6    Period Weeks    Status New    Target Date 02/25/21      PT LONG TERM GOAL #5   Title Pt will tolerate standing x 10 minutes with pain <= 2/10 to improve ability to perform IADLs    Time 6    Period Weeks    Status New    Target Date 02/25/21                   Plan - 01/26/21 1208     Clinical Impression Statement Flare up of symptoms after DN as well as 4 hr car ride to/from beach.  Pt is point tender across sacrum (bilat) and at Rt sciatic notch; no improvement with STM to glutes.  Pt responds well to neutral and slight ext based exercises; updated HEP to reflect this.  She reported reduction in sacrum/glute pain with Rt leg in extended position (standing and seated).  Encouraged pt to perform exercises in water as way to offload the joints and calm pain down.    Goals are ongoing.    Rehab  Potential Good    PT Frequency 2x / week    PT Duration 6 weeks    PT Treatment/Interventions Aquatic Therapy;Cryotherapy;Moist Heat;Iontophoresis 4mg /ml Dexamethasone;Traction;Electrical Stimulation;Gait training;Stair training;Neuromuscular re-education;Balance training;Therapeutic  exercise;Therapeutic activities;DME Instruction;Taping;Patient/family education;Manual techniques;Dry needling    PT Next Visit Plan core strength, assess response to new HEP.    PT Home Exercise Plan [email or web address]    Consulted and Agree with Plan of Care Patient             Patient will benefit from skilled therapeutic intervention in order to improve the following deficits and impairments:  Pain, Postural dysfunction, Decreased strength, Decreased activity tolerance, Decreased range of motion, Increased muscle spasms, Difficulty walking, Decreased balance, Decreased mobility, Decreased endurance  Visit Diagnosis: Acute right-sided low back pain with sciatica, sciatica laterality unspecified  Muscle weakness (generalized)  Difficulty in walking, not elsewhere classified  Other symptoms and signs involving the musculoskeletal system     Problem List Patient Active Problem List   Diagnosis Date Noted   Right lumbar radiculitis 01/12/2021   Osteopenia 08/27/2020   Allergy to honey bee venom 08/04/2020   Memory impairment 12/06/2019   Trigger finger 08/07/2019   Primary insomnia 03/23/2018   Long-term use of high-risk medication 02/06/2018   Calcific Achilles tendinitis of left lower extremity 10/06/2017   Haglund's deformity of left heel 10/06/2017   Essential hypertension 02/01/2017   Rheumatoid arthritis involving multiple sites (HCC) 02/01/2017   Diabetes mellitus without complication (HCC) 02/01/2017   History of colon polyps 02/01/2017   Depression, major, single episode, complete remission (HCC) 02/01/2017   Hyperlipidemia 02/01/2017   04/03/2017, PTA 01/26/21 12:17 PM  [facility] [place] [address] [place], [place], [place] Phone: [phone]   Fax:  [phone]  Name: LORETA BLOUCH MRN: [name] Date of Birth: 12/14/1955

## 2021-01-30 ENCOUNTER — Ambulatory Visit: Payer: Medicare Other | Admitting: Physical Therapy

## 2021-02-03 ENCOUNTER — Ambulatory Visit (INDEPENDENT_AMBULATORY_CARE_PROVIDER_SITE_OTHER): Payer: Medicare Other | Admitting: Physical Therapy

## 2021-02-03 ENCOUNTER — Other Ambulatory Visit: Payer: Self-pay

## 2021-02-03 DIAGNOSIS — M5440 Lumbago with sciatica, unspecified side: Secondary | ICD-10-CM | POA: Diagnosis not present

## 2021-02-03 DIAGNOSIS — M6281 Muscle weakness (generalized): Secondary | ICD-10-CM

## 2021-02-03 DIAGNOSIS — R29898 Other symptoms and signs involving the musculoskeletal system: Secondary | ICD-10-CM | POA: Diagnosis not present

## 2021-02-03 DIAGNOSIS — R262 Difficulty in walking, not elsewhere classified: Secondary | ICD-10-CM | POA: Diagnosis not present

## 2021-02-03 NOTE — Therapy (Signed)
[place], [place], [address] Phone: [phone]   Fax:  [phone]  Physical Therapy Treatment  Patient Details  Name: Ann Torres MRN: [id number] Date of Birth: 1955-12-14 Referring Provider (PT): Thekkekandam   Encounter Date: 02/03/2021   PT End of Session - 02/03/21 1014     Visit Number 5    Number of Visits 12    Date for PT Re-Evaluation 02/25/21    Authorization - Visit Number 5    Progress Note Due on Visit 10    PT Start Time 2505    PT Stop Time 1014    PT Time Calculation (min) 40 min    Activity Tolerance Patient tolerated treatment well    Behavior During Therapy [facility] for tasks assessed/performed             No past medical history on file.  Past Surgical History:  Procedure Laterality Date   BILATERAL OOPHORECTOMY     BREAST BIOPSY     in pts 30s   BREAST CYST EXCISION     CESAREAN SECTION     x 3   CHOLECYSTECTOMY     TOTAL ABDOMINAL HYSTERECTOMY     TUBAL LIGATION      There were no vitals filed for this visit.   Subjective Assessment - 02/03/21 0940     Subjective Pt reports that she is walking 1/2 mile a day.  She is trying hard not to over-do it.  She is getting in pool 3x / wk.  Right now, biggest issue with standing still or walking longer distances. Using TENS 2x /day.  Hip no longer bothering her.    Currently in Pain? Yes    Pain Score 3     Pain Location Back    Pain Orientation Lower    Aggravating Factors  see subjective    Pain Relieving Factors ice, TENS                [facility] PT Assessment - 02/03/21 0001       Assessment   Medical Diagnosis Rt Lumbar radiculopathy    Referring Provider (PT) Thekkekandam    Next MD Visit 02/23/21    Prior Therapy yes for low back and hip pain 1 year ago      AROM   Lumbar Flexion 75%    Lumbar Extension limited 75%    Lumbar - Right Side Bend WNL    Lumbar - Left Side Bend WNL    Lumbar -  Right Rotation limited 10%    Lumbar - Left Rotation WNL      Strength   Strength Assessment Site Hip    Right/Left Hip Right;Left    Right Hip Flexion 4+/5    Right Hip Extension 4/5    Right Hip ABduction 4+/5    Left Hip Flexion 4+/5    Left Hip Extension 4+/5    Left Hip ABduction 4+/5               [facility] Adult PT Treatment/Exercise - 02/03/21 0001       Exercises   Exercises --   verbally reviewed current HEP including aquatic exercise     Lumbar Exercises: Stretches   Prone on Elbows Stretch 5 reps   5 sec   Piriformis Stretch Right;Left;1 rep;30 seconds    Piriformis Stretch Limitations modified pigeon pose      Lumbar Exercises: Aerobic   Nustep L5: 6.5  Other Aerobic Exercise single laps in between exercises to decrease stiffness.      Lumbar Exercises: Seated   Sit to Stand 10 reps   cues for hip hinge and straight back; improved with repetition     Lumbar Exercises: Supine   Clam 10 reps   red band at thigh, core engaged.                         PT Long Term Goals - 02/03/21 1005       PT LONG TERM GOAL #1   Title Pt will be independent with HEP    Time 6    Period Weeks    Status On-going      PT LONG TERM GOAL #2   Title Pt will improve FOTO to >=59 to demo improved functional mobility6    Time 6    Period Weeks    Status On-going      PT LONG TERM GOAL #3   Title Pt will improve LE strength to 4/5 to improve functional activity tolerance and transfers    Time 6    Period Weeks    Status Achieved      PT LONG TERM GOAL #4   Title Pt will improve lumbar ROM to <= 25% limited in all directions to decrease pain with transitional movements    Time 6    Period Weeks    Status Achieved      PT LONG TERM GOAL #5   Title Pt will tolerate standing x 10 minutes with pain <= 2/10 to improve ability to perform IADLs    Baseline can stand 15-20 min with pain up to 5/10    Time 6    Period Weeks    Status Partially Met                    Plan - 02/03/21 1012     Clinical Impression Statement Good improvement in symptoms since last visit.  Hip strength and lumbar ROM improved; met these goals. continues with pain in sacrum with lumbar ext (prone on elbows).  Will assess HEP and progress as tolerated next visit.    Rehab Potential Good    PT Frequency 2x / week    PT Duration 6 weeks    PT Treatment/Interventions Aquatic Therapy;Cryotherapy;Moist Heat;Iontophoresis 78m/ml Dexamethasone;Traction;Electrical Stimulation;Gait training;Stair training;Neuromuscular re-education;Balance training;Therapeutic exercise;Therapeutic activities;DME Instruction;Taping;Patient/family education;Manual techniques;Dry needling    PT Next Visit Plan progress core / hip.  add additional aquatic exercises to HEP.    PT Home Exercise Plan [email or web address]    Consulted and Agree with Plan of Care Patient             Patient will benefit from skilled therapeutic intervention in order to improve the following deficits and impairments:  Pain, Postural dysfunction, Decreased strength, Decreased activity tolerance, Decreased range of motion, Increased muscle spasms, Difficulty walking, Decreased balance, Decreased mobility, Decreased endurance  Visit Diagnosis: Acute right-sided low back pain with sciatica, sciatica laterality unspecified  Muscle weakness (generalized)  Difficulty in walking, not elsewhere classified  Other symptoms and signs involving the musculoskeletal system     Problem List Patient Active Problem List   Diagnosis Date Noted   Right lumbar radiculitis 01/12/2021   Osteopenia 08/27/2020   Allergy to honey bee venom 08/04/2020   Memory impairment 12/06/2019   Trigger finger 08/07/2019   Primary insomnia 03/23/2018   Long-term use of high-risk medication 02/06/2018  Calcific Achilles tendinitis of left lower extremity 10/06/2017   Haglund's deformity of left heel 10/06/2017   Essential hypertension  02/01/2017   Rheumatoid arthritis involving multiple sites ([place]) 02/01/2017   Diabetes mellitus without complication ([place]) 33/83/2919   History of colon polyps 02/01/2017   Depression, major, single episode, complete remission ([place]) 02/01/2017   Hyperlipidemia 02/01/2017    Kerin Perna, PTA 02/03/21 10:17 AM  [place], [place], [address] Phone: [phone]   Fax:  [phone]  Name: TANYA CROTHERS MRN: [id number] Date of Birth: 10/10/1955

## 2021-02-05 ENCOUNTER — Encounter: Payer: Medicare Other | Admitting: Physical Therapy

## 2021-02-06 DIAGNOSIS — M19042 Primary osteoarthritis, left hand: Secondary | ICD-10-CM | POA: Diagnosis not present

## 2021-02-06 DIAGNOSIS — M19041 Primary osteoarthritis, right hand: Secondary | ICD-10-CM | POA: Diagnosis not present

## 2021-02-06 DIAGNOSIS — Z79899 Other long term (current) drug therapy: Secondary | ICD-10-CM | POA: Diagnosis not present

## 2021-02-06 DIAGNOSIS — M0579 Rheumatoid arthritis with rheumatoid factor of multiple sites without organ or systems involvement: Secondary | ICD-10-CM | POA: Diagnosis not present

## 2021-02-07 ENCOUNTER — Other Ambulatory Visit: Payer: Self-pay | Admitting: Family Medicine

## 2021-02-10 ENCOUNTER — Other Ambulatory Visit: Payer: Self-pay

## 2021-02-10 ENCOUNTER — Encounter: Payer: Self-pay | Admitting: Physical Therapy

## 2021-02-10 ENCOUNTER — Ambulatory Visit (INDEPENDENT_AMBULATORY_CARE_PROVIDER_SITE_OTHER): Payer: Medicare Other | Admitting: Physical Therapy

## 2021-02-10 DIAGNOSIS — R262 Difficulty in walking, not elsewhere classified: Secondary | ICD-10-CM

## 2021-02-10 DIAGNOSIS — M6281 Muscle weakness (generalized): Secondary | ICD-10-CM

## 2021-02-10 DIAGNOSIS — R29898 Other symptoms and signs involving the musculoskeletal system: Secondary | ICD-10-CM

## 2021-02-10 DIAGNOSIS — M5440 Lumbago with sciatica, unspecified side: Secondary | ICD-10-CM | POA: Diagnosis not present

## 2021-02-10 NOTE — Therapy (Addendum)
[place], [place], [address] Phone: [phone]   Fax:  [phone]  Physical Therapy Treatment and Discharge  Patient Details  Name: Ann Torres MRN: [id number] Date of Birth: 1955-09-14 Referring Provider (PT): Dianah Field   Encounter Date: 02/10/2021   PT End of Session - 02/10/21 0927     Visit Number 6    Number of Visits 12    Date for PT Re-Evaluation 02/25/21    Authorization - Visit Number 6    Progress Note Due on Visit 10    PT Start Time 3244    PT Stop Time 0927    PT Time Calculation (min) 40 min    Activity Tolerance Patient tolerated treatment well    Behavior During Therapy [facility] for tasks assessed/performed             History reviewed. No pertinent past medical history.  Past Surgical History:  Procedure Laterality Date   BILATERAL OOPHORECTOMY     BREAST BIOPSY     in pts 30s   BREAST CYST EXCISION     CESAREAN SECTION     x 3   CHOLECYSTECTOMY     TOTAL ABDOMINAL HYSTERECTOMY     TUBAL LIGATION      There were no vitals filed for this visit.   Subjective Assessment - 02/10/21 0859     Subjective Pt reports she was able to play with grandson on floor yesterday and didn't have increased pain.  She has continued HEP stretches and pool 3x/wk    Currently in Pain? Yes    Pain Score 2     Pain Location Back    Pain Orientation Lower    Pain Descriptors / Indicators Sore    Aggravating Factors  standing longer than 5 min.    Pain Relieving Factors ice, TENS                [facility] PT Assessment - 02/10/21 0001       Assessment   Medical Diagnosis Rt Lumbar radiculopathy    Referring Provider (PT) Thekkekandam    Next MD Visit 02/23/21    Prior Therapy yes for low back and hip pain 1 year ago      Observation/Other Assessments   Focus on Therapeutic Outcomes (FOTO)  60 functional status      AROM   Lumbar Extension limited 75%    Lumbar -  Right Rotation limited 10%                [facility] Adult PT Treatment/Exercise - 02/10/21 0001       Lumbar Exercises: Stretches   Passive Hamstring Stretch Right;Left;3 reps;20 seconds    Piriformis Stretch Right;Left;2 reps;20 seconds   modified pigeon     Lumbar Exercises: Aerobic   Nustep L4: arms/leg x 5.5 min    Other Aerobic Exercise single laps in between exercises to decrease stiffness.      Lumbar Exercises: Standing   Other Standing Lumbar Exercises warrior 1 with noodle overhead x 5 reps each leg back; monster walk x 10 ft      Lumbar Exercises: Seated   Sit to Stand 10 reps   cues for hip hinge and straight back; improved with repetition     Lumbar Exercises: Supine   Clam 10 reps   green band at thigh, core engaged. single leg and double leg   Bridge Limitations unable to tolerate      Lumbar  Exercises: Prone   Opposite Arm/Leg Raise Right arm/Left leg;5 reps;2 seconds   not tolerated - switched to discussing pool exercises.               PT Long Term Goals - 02/10/21 0908       PT LONG TERM GOAL #1   Title Pt will be independent with HEP    Time 6    Period Weeks    Status Achieved      PT LONG TERM GOAL #2   Title Pt will improve FOTO to >=59 to demo improved functional mobility6    Time 6    Period Weeks    Status Achieved      PT LONG TERM GOAL #3   Title Pt will improve LE strength to 4/5 to improve functional activity tolerance and transfers    Time 6    Period Weeks    Status Achieved      PT LONG TERM GOAL #4   Title Pt will improve lumbar ROM to <= 25% limited in all directions to decrease pain with transitional movements    Time 6    Period Weeks    Status Achieved      PT LONG TERM GOAL #5   Title Pt will tolerate standing x 10 minutes with pain <= 2/10 to improve ability to perform IADLs    Baseline can stand 10 min with pain up to 3-4/10    Time 6    Period Weeks    Status Partially Met                    Plan - 02/10/21 1300     Clinical Impression Statement Pt is tolerating simplified land exercises well.  When attempting to add additional exercises, they weren't tolerated as well.  Added 4 new aquatic exercises to program, with good tolerance on land.  Recommended she keep land exercises to gentle stretches and 2 basic strengthening exercises, and for her to complete most of her strengtheniing exercises in the pool where she has much less pain.  Pt has met a majority of her goals and is pleased with level of function.  Pt agreeable to hold PT while she continues to work on HEP; will call if she has a flare up or questions.    Rehab Potential Good    PT Frequency 2x / week    PT Duration 6 weeks    PT Treatment/Interventions Aquatic Therapy;Cryotherapy;Moist Heat;Iontophoresis 56m/ml Dexamethasone;Traction;Electrical Stimulation;Gait training;Stair training;Neuromuscular re-education;Balance training;Therapeutic exercise;Therapeutic activities;DME Instruction;Taping;Patient/family education;Manual techniques;Dry needling    PT Next Visit Plan hold PT until 03/10/21; will d/c if pt hasn't returned.    PT Home Exercise Plan [email or web address]    Consulted and Agree with Plan of Care Patient             Patient will benefit from skilled therapeutic intervention in order to improve the following deficits and impairments:  Pain, Postural dysfunction, Decreased strength, Decreased activity tolerance, Decreased range of motion, Increased muscle spasms, Difficulty walking, Decreased balance, Decreased mobility, Decreased endurance  Visit Diagnosis: Acute right-sided low back pain with sciatica, sciatica laterality unspecified  Muscle weakness (generalized)  Difficulty in walking, not elsewhere classified  Other symptoms and signs involving the musculoskeletal system     Problem List Patient Active Problem List   Diagnosis Date Noted   Right lumbar radiculitis 01/12/2021   Osteopenia 08/27/2020    Allergy to honey bee venom 08/04/2020   Memory impairment 12/06/2019  Trigger finger 08/07/2019   Primary insomnia 03/23/2018   Long-term use of high-risk medication 02/06/2018   Calcific Achilles tendinitis of left lower extremity 10/06/2017   Haglund's deformity of left heel 10/06/2017   Essential hypertension 02/01/2017   Rheumatoid arthritis involving multiple sites ([place]) 02/01/2017   Diabetes mellitus without complication ([place]) 98/33/8250   History of colon polyps 02/01/2017   Depression, major, single episode, complete remission ([place]) 02/01/2017   Hyperlipidemia 02/01/2017  PHYSICAL THERAPY DISCHARGE SUMMARY  Visits from Start of Care: 6  Current functional level related to goals / functional outcomes: Improved activity tolerance and mobility, decreased pain   Remaining deficits: See above   Education / Equipment: HEP   Patient agrees to discharge. Patient goals were met. Patient is being discharged due to being pleased with the current functional level.  Isabelle Course, PT,DPT11/04/222:56 PM  Kerin Perna, PTA 02/10/21 1:03 PM   [place], [place], [address] Phone: [phone]   Fax:  [phone]  Name: Ann Torres MRN: [id number] Date of Birth: 1956/01/28

## 2021-02-10 NOTE — Patient Instructions (Signed)
Access Code: [email or web address] URL: [email or web address] Date: 02/10/2021 Prepared by: [facility] [place]  Exercises Hooklying Hamstring Stretch with Strap - 2 x daily - 7 x weekly - 1 sets - 2-3 reps - 15-45 seconds hold Seated Table Piriformis Stretch - 2 x daily - 7 x weekly - 1 sets - 2-3 reps - 15-45 seconds hold Seated Hip Flexor Stretch - 2 x daily - 7 x weekly - 1 sets - 2-3 reps - 15-45 seconds hold Sit to Stand with Arm Swing - 7 x weekly - 2 sets - 5 reps Hooklying Clamshell with Resistance - 1 x daily - 3 x weekly - 1-2 sets - 10 reps Forward Monster Walk - 1 x daily - 7 x weekly - 2 sets - 10 reps Warrior I in [employer] with [employer] - 1 x daily - 3 x weekly - 1 sets - 5-10 reps Warrior III in [employer] with [employer] - 1 x daily - 3 x weekly - 1 sets - 5 reps Squat - 1 x daily - 3 x weekly - 1-2 sets - 10 reps

## 2021-02-11 DIAGNOSIS — E119 Type 2 diabetes mellitus without complications: Secondary | ICD-10-CM | POA: Diagnosis not present

## 2021-02-18 ENCOUNTER — Ambulatory Visit: Payer: Medicare Other

## 2021-02-23 ENCOUNTER — Ambulatory Visit (INDEPENDENT_AMBULATORY_CARE_PROVIDER_SITE_OTHER): Payer: Medicare Other

## 2021-02-23 ENCOUNTER — Ambulatory Visit (INDEPENDENT_AMBULATORY_CARE_PROVIDER_SITE_OTHER): Payer: Medicare Other | Admitting: Sports Medicine

## 2021-02-23 ENCOUNTER — Other Ambulatory Visit: Payer: Self-pay

## 2021-02-23 DIAGNOSIS — M5416 Radiculopathy, lumbar region: Secondary | ICD-10-CM | POA: Diagnosis not present

## 2021-02-23 DIAGNOSIS — M545 Low back pain, unspecified: Secondary | ICD-10-CM | POA: Diagnosis not present

## 2021-02-23 NOTE — Assessment & Plan Note (Signed)
Ann Torres returns, she is a pleasant 65 year old female, she had moderate to severe low back pain with radiation down the back of the right leg initially, posterior/lateral right lower leg pain but it was not quite to the foot, no red flag symptoms, axial component of pain was discogenic. She has now done greater than 6 weeks of formal physical therapy, naproxen, Flexeril, x-rays show degenerative disc disease, due to persistence of pain we are going to proceed with epidural, we will try to get her MRI today. Some of her symptomatology has evolved to an L4 distribution so we may consider L4-L5 epidural once I see the MRI.

## 2021-02-23 NOTE — Progress Notes (Signed)
    Procedures performed today:    None.  Independent interpretation of notes and tests performed by another provider:   None.  Brief History, Exam, Impression, and Recommendations:    Right lumbar radiculitis Ann Torres returns, she is a pleasant 65 year old female, she had moderate to severe low back pain with radiation down the back of the right leg initially, posterior/lateral right lower leg pain but it was not quite to the foot, no red flag symptoms, axial component of pain was discogenic. She has now done greater than 6 weeks of formal physical therapy, naproxen, Flexeril, x-rays show degenerative disc disease, due to persistence of pain we are going to proceed with epidural, we will try to get her MRI today. Some of her symptomatology has evolved to an L4 distribution so we may consider L4-L5 epidural once I see the MRI.    ___________________________________________ Ihor Austin. Benjamin Stain, M.D., ABFM., CAQSM. Primary Care and Sports Medicine [facility]  Adjunct Instructor of Family Medicine  [employer]

## 2021-02-26 ENCOUNTER — Other Ambulatory Visit: Payer: Self-pay

## 2021-02-26 ENCOUNTER — Other Ambulatory Visit: Payer: Self-pay | Admitting: Sports Medicine

## 2021-02-26 ENCOUNTER — Ambulatory Visit (INDEPENDENT_AMBULATORY_CARE_PROVIDER_SITE_OTHER): Payer: Medicare Other

## 2021-02-26 DIAGNOSIS — Z1231 Encounter for screening mammogram for malignant neoplasm of breast: Secondary | ICD-10-CM | POA: Diagnosis not present

## 2021-02-26 DIAGNOSIS — M5416 Radiculopathy, lumbar region: Secondary | ICD-10-CM

## 2021-02-26 MED ORDER — TRAMADOL HCL 50 MG PO TABS: 50.0000 mg | ORAL_TABLET | Freq: Three times a day (TID) | ORAL | 0 refills | Status: DC | PRN

## 2021-03-03 ENCOUNTER — Encounter: Payer: Self-pay | Admitting: Family Medicine

## 2021-03-03 ENCOUNTER — Encounter (INDEPENDENT_AMBULATORY_CARE_PROVIDER_SITE_OTHER): Payer: Medicare Other

## 2021-03-03 DIAGNOSIS — M5416 Radiculopathy, lumbar region: Secondary | ICD-10-CM | POA: Diagnosis not present

## 2021-03-03 DIAGNOSIS — E119 Type 2 diabetes mellitus without complications: Secondary | ICD-10-CM

## 2021-03-03 MED ORDER — INSULIN LISPRO 100 UNIT/ML IJ SOLN: 5.0000 [IU] | Freq: Three times a day (TID) | INTRAMUSCULAR | 5 refills | Status: DC

## 2021-03-03 MED ORDER — HUMALOG MIX 75/25 (75-25) 100 UNIT/ML ~~LOC~~ SUSP: SUBCUTANEOUS | 5 refills | Status: DC

## 2021-03-03 MED ORDER — "INSULIN SYRINGE 29G X 1/2"" 1 ML MISC": 3 refills | Status: DC

## 2021-03-03 NOTE — Progress Notes (Signed)
Please call patient. Normal mammogram.  Repeat in 1 year.  

## 2021-03-03 NOTE — Assessment & Plan Note (Signed)
Multilevel foraminal stenosis, right L4, L5, S1, starting with right L4-L5 transforaminal epidural. We can try right L5 and right S1 at future visit if needed. Please see prior notes for description of distribution of radicular symptoms.

## 2021-03-03 NOTE — Telephone Encounter (Signed)
I spent 5 total minutes of online digital evaluation and management services. 

## 2021-03-03 NOTE — Telephone Encounter (Signed)
  Meds ordered this encounter  Medications   insulin lispro protamine-lispro (HUMALOG MIX 75/25) (75-25) 100 UNIT/ML SUSP injection    Sig: 20 units SQ QAM and 18 unit each evening.    Dispense:  20 mL    Refill:  5   insulin lispro (HUMALOG) 100 UNIT/ML injection    Sig: Inject 0.05-0.1 mLs (5-10 Units total) into the skin 3 (three) times daily before meals.    Dispense:  10 mL    Refill:  5   INSULIN SYRINGE 1CC/29G 29G X 1/2" 1 ML MISC    Sig: Use up to TID for insulin. Ok to sub if needed as covered by insurance.    Dispense:  200 each    Refill:  3

## 2021-03-06 ENCOUNTER — Ambulatory Visit
Admission: RE | Admit: 2021-03-06 | Discharge: 2021-03-06 | Disposition: A | Discharge status: Home / Self Care | Payer: Medicare Other | Source: Ambulatory Visit | Attending: Sports Medicine | Admitting: Sports Medicine

## 2021-03-06 ENCOUNTER — Other Ambulatory Visit: Payer: Self-pay

## 2021-03-06 DIAGNOSIS — M5416 Radiculopathy, lumbar region: Secondary | ICD-10-CM

## 2021-03-06 MED ORDER — METHYLPREDNISOLONE ACETATE 40 MG/ML INJ SUSP (RADIOLOG
80.0000 mg | Freq: Once | INTRAMUSCULAR | Status: AC
  Administered 2021-03-06: 80 mg via EPIDURAL

## 2021-03-06 MED ORDER — IOPAMIDOL (ISOVUE-M 200) INJECTION 41%
1.0000 mL | Freq: Once | INTRAMUSCULAR | Status: AC
  Administered 2021-03-06: 1 mL via EPIDURAL

## 2021-03-06 NOTE — Discharge Instructions (Signed)

## 2021-03-09 NOTE — Telephone Encounter (Signed)
This might need to go through a safety zone portal, [place] [facility] injected the wrong side and yet here she is talking to me about it.  I ordered the correct side, FYI to Transport planner.

## 2021-03-12 DIAGNOSIS — R748 Abnormal levels of other serum enzymes: Secondary | ICD-10-CM | POA: Diagnosis not present

## 2021-03-12 DIAGNOSIS — E559 Vitamin D deficiency, unspecified: Secondary | ICD-10-CM | POA: Diagnosis not present

## 2021-03-16 DIAGNOSIS — E119 Type 2 diabetes mellitus without complications: Secondary | ICD-10-CM | POA: Diagnosis not present

## 2021-03-17 ENCOUNTER — Telehealth: Payer: Self-pay

## 2021-03-17 NOTE — Telephone Encounter (Addendum)
Phone call to patient to follow up from her injection on 03/06/21, per Dr. Vesta Mixer request. The patient reports pain on the left side is new since the injection, numbness going down the left leg, and is in "constant pain now". The patient reports no relief of pain to the right side. The patient states Dr. Benjamin Stain placed the patient on oral pain medication and is supposed to follow- up with him tomorrow. I explained to the patient that we would be happy to have her come in for an injection for the right side, free of any charge. Pt stated she did not want to have another injection at this time and wanted to follow up with Dr. Benjamin Stain. Dr. Juliette Alcide was made aware.

## 2021-03-18 ENCOUNTER — Ambulatory Visit (INDEPENDENT_AMBULATORY_CARE_PROVIDER_SITE_OTHER): Payer: Medicare Other | Admitting: Sports Medicine

## 2021-03-18 ENCOUNTER — Other Ambulatory Visit: Payer: Self-pay

## 2021-03-18 DIAGNOSIS — M48061 Spinal stenosis, lumbar region without neurogenic claudication: Secondary | ICD-10-CM

## 2021-03-18 MED ORDER — AMBULATORY NON FORMULARY MEDICATION
0 refills | Status: DC
Start: 2021-03-18 — End: 2022-03-22

## 2021-03-18 NOTE — Assessment & Plan Note (Signed)
This is a 65 year old female, she has moderate to severe lumbar spinal stenosis, L5-S1 level per the transitional numbering scheme. We had initially ordered a right-sided transforaminal epidural at the L4-L5 level considering predominantly right-sided L4 distribution radicular symptoms into the great toe, unfortunately the epidural was performed on the incorrect side at [facility], and that she has not had any improvement even with the steroid effect. She is endorsing some tripping and falling as well. She is agreeable to try another epidural, on the correct side this time, but we will use a different provider, Peggye Pitt here in [place], and I would like this to be a right L5-S1 (based on transitional numbering scheme) interlaminar epidural as she had a great deal of discomfort with the transforaminal approach. She will continue her naproxen twice a day, tramadol up to 3 times a day for breakthrough pain. Considering the buckling and the weakness I would like her to have a surgical opinion from Dr. Marikay Alar. Return to see me about a month after her epidural with Dr. Laurian Brim. Handicap placard and rolling walker per her request.

## 2021-03-18 NOTE — Progress Notes (Signed)
    Procedures performed today:    None.  Independent interpretation of notes and tests performed by another provider:   None.  Brief History, Exam, Impression, and Recommendations:    Lumbar spinal stenosis with right-sided radiculitis This is a 65 year old female, she has moderate to severe lumbar spinal stenosis, L5-S1 level per the transitional numbering scheme. We had initially ordered a right-sided transforaminal epidural at the L4-L5 level considering predominantly right-sided L4 distribution radicular symptoms into the great toe, unfortunately the epidural was performed on the incorrect side at [facility] imaging, and that she has not had any improvement even with the steroid effect. She is endorsing some tripping and falling as well. She is agreeable to try another epidural, on the correct side this time, but we will use a different provider, Peggye Pitt here in [place], and I would like this to be a right L5-S1 (based on transitional numbering scheme) interlaminar epidural as she had a great deal of discomfort with the transforaminal approach. She will continue her naproxen twice a day, tramadol up to 3 times a day for breakthrough pain. Considering the buckling and the weakness I would like her to have a surgical opinion from Dr. Marikay Alar. Return to see me about a month after her epidural with Dr. Laurian Brim. Handicap placard and rolling walker per her request.    ___________________________________________ Ihor Austin. Benjamin Stain, M.D., ABFM., CAQSM. Primary Care and Sports Medicine [facility]  Adjunct Instructor of Family Medicine  [employer]

## 2021-03-18 NOTE — Patient Instructions (Signed)
Naproxen twice a day with food, tramadol 3 times a day for breakthrough pain

## 2021-03-19 DIAGNOSIS — M48061 Spinal stenosis, lumbar region without neurogenic claudication: Secondary | ICD-10-CM | POA: Diagnosis not present

## 2021-03-30 ENCOUNTER — Ambulatory Visit (INDEPENDENT_AMBULATORY_CARE_PROVIDER_SITE_OTHER): Payer: Medicare Other | Admitting: Family Medicine

## 2021-03-30 ENCOUNTER — Encounter: Payer: Self-pay | Admitting: Family Medicine

## 2021-03-30 ENCOUNTER — Other Ambulatory Visit: Payer: Self-pay

## 2021-03-30 VITALS — BP 134/78 | HR 76 | Ht 64.0 in | Wt 163.0 lb

## 2021-03-30 DIAGNOSIS — Z23 Encounter for immunization: Secondary | ICD-10-CM

## 2021-03-30 DIAGNOSIS — I10 Essential (primary) hypertension: Secondary | ICD-10-CM

## 2021-03-30 DIAGNOSIS — M48061 Spinal stenosis, lumbar region without neurogenic claudication: Secondary | ICD-10-CM

## 2021-03-30 DIAGNOSIS — E119 Type 2 diabetes mellitus without complications: Secondary | ICD-10-CM | POA: Diagnosis not present

## 2021-03-30 LAB — POCT GLYCOSYLATED HEMOGLOBIN (HGB A1C): Hemoglobin A1C: 6.8 % — AB (ref 4.0–5.6)

## 2021-03-30 NOTE — Assessment & Plan Note (Signed)
A1c up a little bit today to 6.8 but still under 7 which is great.  But she has had several round of steroids which I do think would explain the elevated blood sugar levels.

## 2021-03-30 NOTE — Assessment & Plan Note (Signed)
Well controlled. Continue current regimen. Follow up in  3-4 mo  

## 2021-03-30 NOTE — Progress Notes (Signed)
Pt reports that she has been falling more. She has been seeing Dr. Karie Schwalbe for this. She has an appt with Neurology on 12/8

## 2021-03-30 NOTE — Progress Notes (Signed)
Established Patient Office Visit  Subjective:  Patient ID: Ann Torres, female    DOB: [birth year]  Age: 65 y.o. MRN: [id number]  CC:  Chief Complaint  Patient presents with   Diabetes    HPI Ann Torres presents for   Diabetes - no hypoglycemic events. No wounds or sores that are not healing well. No increased thirst or urination. Checking glucose at home. Taking medications as prescribed without any side effects. Had been receiving steroid injections for her back she is also taken several rounds of oral steroids she has been really liking her continuous glucose monitor.  She has an appointment on December 8 with a neurosurgeon for her back.  She was supposed to have some injections done on the right side but unfortunately they were done on the left and since then she has had persistent pain and tingling down into that left leg.  That was 4 weeks ago.  She says even the left legs been giving out to the point that she is fallen and she has been using a walker.  Hypertension- Pt denies chest pain, SOB, dizziness, or heart palpitations.  Taking meds as directed w/o problems.  Denies medication side effects.     No past medical history on file.  Past Surgical History:  Procedure Laterality Date   BILATERAL OOPHORECTOMY     BREAST BIOPSY     in pts 79s   BREAST CYST EXCISION     Pt unsure which breast & unable to find a scar   CESAREAN SECTION     x 3   CHOLECYSTECTOMY     TOTAL ABDOMINAL HYSTERECTOMY     TUBAL LIGATION      Family History  Problem Relation Age of Onset   Colon cancer Mother    Heart attack Father 8    Social History   Socioeconomic History   Marital status: Divorced    Spouse name: Not on file   Number of children: Not on file   Years of education: 2 yr college   Highest education level: Not on file  Occupational History   Occupation: retired  Tobacco Use   Smoking status: Former    Types: Cigarettes    Quit date: 08/01/2017    Years since  quitting: 3.6   Smokeless tobacco: Never  Substance and Sexual Activity   Alcohol use: No   Drug use: No   Sexual activity: Never  Other Topics Concern   Not on file  Social History Narrative   Looks for 15 minutes daily. Denies significant caffeine intake. Currently divorced and not sexually active. Currently living with her daughter and daughter's family.   Social Determinants of Health   Financial Resource Strain: Low Risk    Difficulty of Paying Living Expenses: Not hard at all  Food Insecurity: No Food Insecurity   Worried About Charity fundraiser in the Last Year: Never true   [place] in the Last Year: Never true  Transportation Needs: No Transportation Needs   Lack of Transportation (Medical): No   Lack of Transportation (Non-Medical): No  Physical Activity: Inactive   Days of Exercise per Week: 0 days   Minutes of Exercise per Session: 0 min  Stress: No Stress Concern Present   Feeling of Stress : Not at all  Social Connections: Moderately Isolated   Frequency of Communication with Friends and Family: More than three times a week   Frequency of Social Gatherings with Friends and Family:  More than three times a week   Attends Religious Services: Never   Active Member of Clubs or Organizations: Yes   Attends Archivist Meetings: 1 to 4 times per year   Marital Status: Divorced  Human resources officer Violence: Not At Risk   Fear of Current or Ex-Partner: No   Emotionally Abused: No   Physically Abused: No   Sexually Abused: No    Outpatient Medications Prior to Visit  Medication Sig Dispense Refill   Abatacept 125 MG/ML SOAJ Inject 1 Dose into the skin once a week. Sundays     albuterol (VENTOLIN HFA) 108 (90 Base) MCG/ACT inhaler Inhale 2 puffs into the lungs every 6 (six) hours as needed for wheezing or shortness of breath. 8 g 2   AMBULATORY NON FORMULARY MEDICATION Rolling walker, please take Rx to medical supply store. 1 each 0   atorvastatin  (LIPITOR) 20 MG tablet Take 1 tablet (20 mg total) by mouth at bedtime. 90 tablet 3   Calcium-Vitamin D-Vitamin K (VIACTIV CALCIUM PLUS D) 650-12.5-40 MG-MCG-MCG CHEW Chew 2 tablets by mouth in the morning and at bedtime.     cyclobenzaprine (FLEXERIL) 10 MG tablet One half to one tab PO qHS, then increase gradually to one tab TID. 30 tablet 0   Dulaglutide (TRULICITY) 1.5 UV/2.5DG SOPN INJECT 1.5 MG UNDER THE SKIN EVERY 7 DAYS. 6 mL 4   EPINEPHrine 0.3 mg/0.3 mL IJ SOAJ injection Inject 0.3 mg into the muscle as needed for anaphylaxis. 1 each 1   insulin lispro (HUMALOG) 100 UNIT/ML injection Inject 0.05-0.1 mLs (5-10 Units total) into the skin 3 (three) times daily before meals. 10 mL 5   insulin lispro protamine-lispro (HUMALOG MIX 75/25) (75-25) 100 UNIT/ML SUSP injection 20 units SQ QAM and 18 unit each evening. 20 mL 5   Insulin Pen Needle (TRUEPLUS PEN NEEDLES) 31G X 6 MM MISC Use to inject insulin. 100 each 10   INSULIN SYRINGE 1CC/29G 29G X 1/2" 1 ML MISC Use up to TID for insulin. Ok to sub if needed as covered by insurance. 200 each 3   naproxen (NAPROSYN) 500 MG tablet Take 1 tablet (500 mg total) by mouth 2 (two) times daily with a meal. 60 tablet 3   omeprazole (PRILOSEC) 20 MG capsule Take 1 capsule (20 mg total) by mouth daily. 90 capsule 3   [name]-BIONT COVID-19 VAC-TRIS SUSP injection      telmisartan (MICARDIS) 80 MG tablet Take 1 tablet (80 mg total) by mouth daily. 90 tablet 1   traMADol (ULTRAM) 50 MG tablet Take 1-2 tablets (50-100 mg total) by mouth every 8 (eight) hours as needed for moderate pain. Maximum 6 tabs per day. 21 tablet 0   No facility-administered medications prior to visit.    Allergies  Allergen Reactions   Bee Venom Anaphylaxis and Hives   Lisinopril Shortness Of Breath   Azathioprine Nausea And Vomiting   Celecoxib Other (See Comments)    Patient reported   Metformin And Related Other (See Comments)    Diarrhea and vomiting   Red Dye Other (See  Comments)   Enbrel [Etanercept] Other (See Comments)    ENT felt was causing hearing loss   [place] [Dapagliflozin] Other (See Comments)    Recurrent yeast infections   Hydroxychloroquine Rash   Methotrexate Derivatives Rash   Tape Rash    r    ROS Review of Systems    Objective:    Physical Exam Constitutional:  Appearance: Normal appearance. She is well-developed.  HENT:     Head: Normocephalic and atraumatic.  Cardiovascular:     Rate and Rhythm: Normal rate and regular rhythm.     Heart sounds: Normal heart sounds.  Pulmonary:     Effort: Pulmonary effort is normal.     Breath sounds: Normal breath sounds.  Skin:    General: Skin is warm and dry.  Neurological:     Mental Status: She is alert and oriented to person, place, and time.  Psychiatric:        Behavior: Behavior normal.    BP 134/78   Pulse 76   Ht _0  (1.626 m)   Wt 163 lb (73.9 kg)   SpO2 100%   BMI 27.98 kg/m  Wt Readings from Last 3 Encounters:  03/30/21 163 lb (73.9 kg)  11/26/20 164 lb (74.4 kg)  08/04/20 168 lb (76.2 kg)     Health Maintenance Due  Topic Date Due   Zoster Vaccines- Shingrix (1 of 2) Never done   COVID-19 Vaccine (5 - Booster for Janssen series) 03/27/2021   OPHTHALMOLOGY EXAM  03/12/2021    There are no preventive care reminders to display for this patient.  Lab Results  Component Value Date   TSH 0.86 12/07/2019   Lab Results  Component Value Date   WBC 8.1 12/07/2019   HGB 13.6 12/07/2019   HCT 41.2 12/07/2019   MCV 79.5 (L) 12/07/2019   PLT 228 12/07/2019   Lab Results  Component Value Date   NA 140 11/26/2020   K 4.4 11/26/2020   CO2 26 11/26/2020   GLUCOSE 135 (H) 11/26/2020   BUN 15 11/26/2020   CREATININE 0.71 11/26/2020   BILITOT 0.5 11/26/2020   ALKPHOS 113 08/23/2018   AST 15 11/26/2020   ALT 14 11/26/2020   PROT 7.0 11/26/2020   ALBUMIN 4.6 08/23/2018   CALCIUM 9.6 11/26/2020   EGFR 94 11/26/2020   Lab Results  Component  Value Date   CHOL 146 11/26/2020   Lab Results  Component Value Date   HDL 53 11/26/2020   Lab Results  Component Value Date   LDLCALC 69 11/26/2020   Lab Results  Component Value Date   TRIG 162 (H) 11/26/2020   Lab Results  Component Value Date   CHOLHDL 2.8 11/26/2020   Lab Results  Component Value Date   HGBA1C 6.8 (A) 03/30/2021      Assessment & Plan:   Problem List Items Addressed This Visit       Cardiovascular and Mediastinum   Essential hypertension    Well controlled. Continue current regimen. Follow up in  3-4 mo         Endocrine   Diabetes mellitus without complication ([place]) - Primary    A1c up a little bit today to 6.8 but still under 7 which is great.  But she has had several round of steroids which I do think would explain the elevated blood sugar levels.      Relevant Orders   POCT glycosylated hemoglobin (Hb A1C) (Completed)     Other   Lumbar spinal stenosis with right-sided radiculitis    Waiting on seeing the neurosurgeon in a couple of weeks to see what they would recommend she has started using a walker because of recent falls.      Other Visit Diagnoses     Need for pneumococcal vaccination       Relevant Orders   Pneumococcal conjugate vaccine  20-valent (Prevnar 20) (Completed)       Given Prevnar 20 today.    No orders of the defined types were placed in this encounter.   Follow-up: Return in about 4 months (around 07/28/2021) for Diabetes follow-up.    Beatrice Lecher, MD

## 2021-03-30 NOTE — Assessment & Plan Note (Signed)
Waiting on seeing the neurosurgeon in a couple of weeks to see what they would recommend she has started using a walker because of recent falls.

## 2021-04-09 DIAGNOSIS — M5126 Other intervertebral disc displacement, lumbar region: Secondary | ICD-10-CM | POA: Diagnosis not present

## 2021-04-09 DIAGNOSIS — I10 Essential (primary) hypertension: Secondary | ICD-10-CM | POA: Diagnosis not present

## 2021-04-14 DIAGNOSIS — M5416 Radiculopathy, lumbar region: Secondary | ICD-10-CM | POA: Diagnosis not present

## 2021-04-16 DIAGNOSIS — M545 Low back pain, unspecified: Secondary | ICD-10-CM | POA: Diagnosis not present

## 2021-04-16 DIAGNOSIS — M5126 Other intervertebral disc displacement, lumbar region: Secondary | ICD-10-CM | POA: Diagnosis not present

## 2021-04-16 DIAGNOSIS — E119 Type 2 diabetes mellitus without complications: Secondary | ICD-10-CM | POA: Diagnosis not present

## 2021-04-16 DIAGNOSIS — M79605 Pain in left leg: Secondary | ICD-10-CM | POA: Diagnosis not present

## 2021-04-16 DIAGNOSIS — M2578 Osteophyte, vertebrae: Secondary | ICD-10-CM | POA: Diagnosis not present

## 2021-04-16 DIAGNOSIS — R292 Abnormal reflex: Secondary | ICD-10-CM | POA: Diagnosis not present

## 2021-04-16 DIAGNOSIS — M542 Cervicalgia: Secondary | ICD-10-CM | POA: Diagnosis not present

## 2021-05-07 ENCOUNTER — Encounter: Payer: Self-pay | Admitting: Family Medicine

## 2021-05-08 ENCOUNTER — Ambulatory Visit (INDEPENDENT_AMBULATORY_CARE_PROVIDER_SITE_OTHER): Payer: Commercial Managed Care - HMO | Admitting: Physician Assistant

## 2021-05-08 ENCOUNTER — Encounter: Payer: Self-pay | Admitting: Physician Assistant

## 2021-05-08 ENCOUNTER — Other Ambulatory Visit: Payer: Self-pay

## 2021-05-08 VITALS — BP 146/69 | HR 94 | Wt 164.0 lb

## 2021-05-08 DIAGNOSIS — N952 Postmenopausal atrophic vaginitis: Secondary | ICD-10-CM

## 2021-05-08 DIAGNOSIS — N898 Other specified noninflammatory disorders of vagina: Secondary | ICD-10-CM

## 2021-05-08 DIAGNOSIS — Z8619 Personal history of other infectious and parasitic diseases: Secondary | ICD-10-CM

## 2021-05-08 MED ORDER — ESTRADIOL 0.1 MG/GM VA CREA: 1.0000 | TOPICAL_CREAM | Freq: Every day | VAGINAL | 1 refills | Status: DC

## 2021-05-08 NOTE — Patient Instructions (Addendum)
Will give estrace use nightly for 2 weeks then decrease to 3 times a week Atrophic Vaginitis Atrophic vaginitis is a condition in which the tissues that line the vagina become dry and thin. This condition is most common in women who have stopped having regular menstrual periods (are in menopause). This usually starts when a woman is 45 t16o 67 years old. That is the time when a woman's estrogen levels begin to decrease. Estrogen is a female hormone. It helps to keep the tissues of the vagina moist. It stimulates the vagina to produce a clear fluid that lubricates the vagina for sex. This fluid also protects the vagina from infection. Lack of estrogen can cause the lining of the vagina to get thinner and dryer. The vagina may also shrink in size. It may become less elastic. Atrophic vaginitis tends to get worse over time as a woman's estrogen level drops. What are the causes? This condition is caused by the normal drop in estrogen that happens around the time of menopause. What increases the risk? Certain conditions or situations may lower a woman's estrogen level, leading to a higher risk for atrophic vaginitis. You are more likely to develop this condition if: You are taking medicines that block estrogen. You have had your ovaries removed. You are being treated for cancer with radiation or medicines (chemotherapy). You have given birth or are breastfeeding. You are older than age 67. You smoke. What are the signs or symptoms? Symptoms of this condition include: Pain, soreness, a feeling of pressure, or bleeding during sex (dyspareunia). Vaginal burning, irritation, or itching. Pain or bleeding when a speculum is used in a vaginal exam. Having burning pain while urinating. Vaginal discharge. In some cases, there are no symptoms. How is this diagnosed? This condition is diagnosed based on your medical history and a physical exam. This will include a pelvic exam that checks the vaginal tissues.  Though rare, you may also have other tests, including: A urine test. A test that checks the acid balance in your vagina (acid balance test). How is this treated? Treatment for this condition depends on how severe your symptoms are. Treatment may include: Using an over-the-counter vaginal lubricant before sex. Using a long-acting vaginal moisturizer. Using low-dose estrogen for moderate to severe symptoms that do not respond to other treatments. Options include creams, tablets, and inserts (vaginal rings). Before you use a vaginal estrogen, tell your health care provider if you have a history of: Breast cancer. Endometrial cancer. Blood clots. If you are not sexually active and your symptoms are very mild, you may not need treatment. Follow these instructions at home: Medicines Take over-the-counter and prescription medicines only as told by your health care provider. Do not use herbal or alternative medicines unless your health care provider says that you can. Use over-the-counter creams, lubricants, or moisturizers for dryness only as told by your health care provider. General instructions If your atrophic vaginitis is caused by menopause, discuss all of your menopause symptoms and treatment options with your health care provider. Do not douche. Do not use products that can make your vagina dry. These include: Scented feminine sprays. Scented tampons. Scented soaps. Vaginal sex can help to improve blood flow and elasticity of vaginal tissue. If you choose to have sex and it hurts, try using a water-soluble lubricant or moisturizer right before having sex. Contact a health care provider if: Your discharge looks different than normal. Your vagina has an unusual smell. You have new symptoms. Your symptoms do  not improve with treatment. Your symptoms get worse. Summary Atrophic vaginitis is a condition in which the tissues that line the vagina become dry and thin. It is most common in  women who have stopped having regular menstrual periods (are in menopause). Treatment options include using vaginal lubricants and low-dose vaginal estrogen. Contact a health care provider if your vagina has an unusual smell, or if your symptoms get worse or do not improve after treatment. This information is not intended to replace advice given to you by your health care provider. Make sure you discuss any questions you have with your health care provider. Document Revised: 10/18/2019 Document Reviewed: 10/18/2019 Elsevier Patient Education  2022 [employer].

## 2021-05-08 NOTE — Progress Notes (Signed)
Subjective:    Patient ID: Ann Torres, female    DOB: Jan 29, 1956, 66 y.o.   MRN: [id number]  HPI Pt is a 66 yo female who presents to the clinic requesting valtrex for what she thinks is genital herpes outbreak. She has not had outbreak in many years since she was dx 15 years ago. Noticed the vaginal irritation for the last 4 days. No vaginal discharge. Seems red and irritated. Not used any new creams or soaps. She has tried vagisil but not helping. No fever or chills. No pain with urination. No abdominal or flank pain. She has not been sexually active in years.   .. Active Ambulatory Problems    Diagnosis Date Noted   Essential hypertension 02/01/2017   Rheumatoid arthritis involving multiple sites ([place]) 02/01/2017   Diabetes mellitus without complication ([place]) [id number]   History of colon polyps 02/01/2017   Depression, major, single episode, complete remission ([place]) 02/01/2017   Hyperlipidemia 02/01/2017   Calcific Achilles tendinitis of left lower extremity 10/06/2017   Haglund's deformity of left heel 10/06/2017   Primary insomnia 03/23/2018   Long-term use of high-risk medication 02/06/2018   Trigger finger 08/07/2019   Memory impairment 12/06/2019   Allergy to honey bee venom 08/04/2020   Osteopenia 08/27/2020   Lumbar spinal stenosis with right-sided radiculitis 01/12/2021   History of herpes genitalis 05/11/2021   Vaginal atrophy 05/11/2021   Vaginal irritation 05/11/2021   Resolved Ambulatory Problems    Diagnosis Date Noted   Need for immunization against influenza 02/01/2017   Tooth abscess 04/28/2019   SOB (shortness of breath) 08/04/2020   No Additional Past Medical History      Review of Systems See HPI.     Objective:   Physical Exam Vitals reviewed.  Constitutional:      Appearance: Normal appearance.  HENT:     Head: Normocephalic.  Cardiovascular:     Rate and Rhythm: Normal rate and regular rhythm.     Pulses: Normal pulses.  Pulmonary:      Effort: Pulmonary effort is normal.     Breath sounds: Normal breath sounds.  Abdominal:     General: Bowel sounds are normal. There is no distension.     Palpations: Abdomen is soft. There is no mass.     Tenderness: There is no abdominal tenderness. There is no right CVA tenderness, left CVA tenderness, guarding or rebound.     Hernia: No hernia is present.  Genitourinary:    Comments: Atrophic vulva with erythema and white sclerosed appearance No discharge Discomfort with swabbing Neurological:     General: No focal deficit present.     Mental Status: She is alert and oriented to person, place, and time.  Psychiatric:        Mood and Affect: Mood normal.   .. Results for orders placed or performed in visit on 05/08/21  Urine Culture   Specimen: Urine  Result Value Ref Range   MICRO NUMBER: [id number]    SPECIMEN QUALITY: Adequate    Sample Source NOT GIVEN    STATUS: FINAL    Result: No Growth   WET PREP FOR TRICH, YEAST, CLUE   Specimen: Urine  Result Value Ref Range   MICRO NUMBER: [id number]    Specimen Quality Adequate    SOURCE: VAGINA    Status FINAL    RESULT      No Trichomonas vaginalis seen. No yeast seen No clue cells seen Epithelial Cells Present  POCT URINALYSIS DIP (  CLINITEK)  Result Value Ref Range   Color, UA yellow yellow   Clarity, UA clear clear   Glucose, UA negative negative mg/dL   Bilirubin, UA negative negative   Ketones, POC UA negative negative mg/dL   Spec Grav, UA >=1.030 (A) 1.010 - 1.025   Blood, UA negative negative   pH, UA 6.5 5.0 - 8.0   POC PROTEIN,UA negative negative, trace   Urobilinogen, UA 0.2 0.2 or 1.0 E.U./dL   Nitrite, UA Negative Negative   Leukocytes, UA Negative Negative          Assessment & Plan:  Marland KitchenMarland KitchenJaquelin was seen today for rash.  Diagnoses and all orders for this visit:  Vaginal atrophy -     Urine Culture -     POCT URINALYSIS DIP (CLINITEK) -     Varicella Zoster,Rapid Method,Culture -     WET PREP  FOR TRICH, YEAST, CLUE -     estradiol (ESTRACE VAGINAL) 0.1 MG/GM vaginal cream; Place 1 Applicatorful vaginally at bedtime. For 2 weeks then 3 times a week.  Vaginal irritation -     Urine Culture -     POCT URINALYSIS DIP (CLINITEK) -     Varicella Zoster,Rapid Method,Culture -     WET PREP FOR [place], YEAST, CLUE  History of herpes genitalis -     Varicella Zoster,Rapid Method,Culture -     WET PREP FOR TRICH, YEAST, CLUE   No overt symptoms of UTI. UA negative for nitrites, leuks, blood. Will culture urine.  No discharge will check for yeast, BV, trich. No hx of sexual intercourse, declined STD testing No evidence of herpetic lesions I did swab and will test for herpes Appearance is more like atrophy and dryness Sent estrace cream to start nightly for 2 weeks then three times a week.  Follow up with PCP in 1 month or sooner if needed.

## 2021-05-10 LAB — WET PREP FOR TRICH, YEAST, CLUE
MICRO NUMBER:: 12837833
Specimen Quality: ADEQUATE

## 2021-05-10 LAB — URINE CULTURE
MICRO NUMBER:: 12837834
Result:: NO GROWTH
SPECIMEN QUALITY:: ADEQUATE

## 2021-05-11 DIAGNOSIS — Z8619 Personal history of other infectious and parasitic diseases: Secondary | ICD-10-CM | POA: Insufficient documentation

## 2021-05-11 DIAGNOSIS — N898 Other specified noninflammatory disorders of vagina: Secondary | ICD-10-CM | POA: Insufficient documentation

## 2021-05-11 DIAGNOSIS — N952 Postmenopausal atrophic vaginitis: Secondary | ICD-10-CM | POA: Insufficient documentation

## 2021-05-11 LAB — POCT URINALYSIS DIP (CLINITEK)
Bilirubin, UA: NEGATIVE
Blood, UA: NEGATIVE
Glucose, UA: NEGATIVE mg/dL
Ketones, POC UA: NEGATIVE mg/dL
Leukocytes, UA: NEGATIVE
Nitrite, UA: NEGATIVE
POC PROTEIN,UA: NEGATIVE
Spec Grav, UA: >=1.03 — AB (ref 1.010–1.025)
Urobilinogen, UA: 0.2 E.U./dL
pH, UA: 6.5 (ref 5.0–8.0)

## 2021-05-11 NOTE — Progress Notes (Signed)
No bacteria growth on urine culture.  No yeast, trich, or BV.  Do you have any benefit from estrace cream at this time?

## 2021-05-12 DIAGNOSIS — M5416 Radiculopathy, lumbar region: Secondary | ICD-10-CM | POA: Diagnosis not present

## 2021-05-17 DIAGNOSIS — E119 Type 2 diabetes mellitus without complications: Secondary | ICD-10-CM | POA: Diagnosis not present

## 2021-05-22 ENCOUNTER — Other Ambulatory Visit: Payer: Self-pay | Admitting: Family Medicine

## 2021-05-22 DIAGNOSIS — I10 Essential (primary) hypertension: Secondary | ICD-10-CM

## 2021-05-22 LAB — VARICELLA ZOSTER VIRUS,RAPID METHOD,CULTURE

## 2021-05-25 NOTE — Progress Notes (Signed)
This was supposed to be a HSV 1 and 2 swab not varicella.

## 2021-06-03 DIAGNOSIS — M5116 Intervertebral disc disorders with radiculopathy, lumbar region: Secondary | ICD-10-CM | POA: Diagnosis not present

## 2021-06-03 DIAGNOSIS — M48062 Spinal stenosis, lumbar region with neurogenic claudication: Secondary | ICD-10-CM | POA: Diagnosis not present

## 2021-06-11 ENCOUNTER — Encounter: Payer: Self-pay | Admitting: Family Medicine

## 2021-06-11 ENCOUNTER — Telehealth (INDEPENDENT_AMBULATORY_CARE_PROVIDER_SITE_OTHER): Payer: Medicare Other | Admitting: Family Medicine

## 2021-06-11 DIAGNOSIS — U071 COVID-19: Secondary | ICD-10-CM

## 2021-06-11 MED ORDER — NIRMATRELVIR/RITONAVIR (PAXLOVID)TABLET: 3.0000 | ORAL_TABLET | Freq: Two times a day (BID) | ORAL | 0 refills | Status: AC

## 2021-06-11 NOTE — Telephone Encounter (Signed)
Appt complted.

## 2021-06-11 NOTE — Progress Notes (Signed)
Virtual Visit via Telephone Note  I connected with Ann Torres on 06/11/21 at  4:00 PM EST by telephone and verified that I am speaking with the correct person using two identifiers.   I discussed the limitations, risks, security and privacy concerns of performing an evaluation and management service by telephone and the availability of in person appointments. I also discussed with the patient that there may be a patient responsible charge related to this service. The patient expressed understanding and agreed to proceed.  Patient location: At home. Provider loccation: In office   Subjective:    CC:  No chief complaint on file.   HPI: She reports that she actually had back surgery last Wednesday.  And then on Monday started getting fevers chills and sore throat only place that she had been with the surgery center and they were not masking.  Then by yesterday she started getting much more chest heaviness and had to use her inhaler.  She does not have a history of asthma but does have a history of rheumatoid and she is currently on a biologic.  She does have also a history of hypertension and she is 66 years old.  She also reports nasal congestion.   Past medical history, Surgical history, Family history not pertinant except as noted below, Social history, Allergies, and medications have been entered into the medical record, reviewed, and corrections made.   Review of Systems: No fevers, chills, night sweats, weight loss, chest pain, or shortness of breath.   Objective:    General: Speaking clearly in complete sentences without any shortness of breath.  Alert and oriented x3.  Normal judgment. No apparent acute distress.    Impression and Recommendations:    Problem List Items Addressed This Visit   None Visit Diagnoses     COVID-19    -  Primary   Relevant Medications   nirmatrelvir/ritonavir EUA (PAXLOVID) 20 x 150 MG & 10 x 100MG  TABS      COVID 19-discussed current  symptoms.  Discussed current quarantine guidelines.  Okay to use symptomatic care and over-the-counter medications for cough etc.  Discussed pros and cons of Paxlovid she is moderate risk but is also currently on a biologic and did actually give her injection on Sunday the day before her symptoms actually started.  So we can opt to go ahead and put her on Paxlovid.  She has fantastic renal function.  Call if any new symptoms, worsening symptoms, or not feeling better after 1 week.  Meds ordered this encounter  Medications   nirmatrelvir/ritonavir EUA (PAXLOVID) 20 x 150 MG & 10 x 100MG  TABS    Sig: Take 3 tablets by mouth 2 (two) times daily for 5 days. (Take nirmatrelvir 150 mg two tablets twice daily for 5 days and ritonavir 100 mg one tablet twice daily for 5 days) Patient GFR is 96    Dispense:  30 tablet    Refill:  0    Meds ordered this encounter  Medications   nirmatrelvir/ritonavir EUA (PAXLOVID) 20 x 150 MG & 10 x 100MG  TABS    Sig: Take 3 tablets by mouth 2 (two) times daily for 5 days. (Take nirmatrelvir 150 mg two tablets twice daily for 5 days and ritonavir 100 mg one tablet twice daily for 5 days) Patient GFR is 96    Dispense:  30 tablet    Refill:  0     I discussed the assessment and treatment plan with the patient.  The patient was provided an opportunity to ask questions and all were answered. The patient agreed with the plan and demonstrated an understanding of the instructions.   The patient was advised to call back or seek an in-person evaluation if the symptoms worsen or if the condition fails to improve as anticipated.  I provided 20 minutes of non-face-to-face time during this encounter.   Nani Gasseratherine Pilar Corrales, MD

## 2021-06-17 DIAGNOSIS — E119 Type 2 diabetes mellitus without complications: Secondary | ICD-10-CM | POA: Diagnosis not present

## 2021-06-26 ENCOUNTER — Other Ambulatory Visit: Payer: Self-pay | Admitting: Family Medicine

## 2021-06-26 DIAGNOSIS — H905 Unspecified sensorineural hearing loss: Secondary | ICD-10-CM | POA: Diagnosis not present

## 2021-07-18 DIAGNOSIS — E119 Type 2 diabetes mellitus without complications: Secondary | ICD-10-CM | POA: Diagnosis not present

## 2021-07-26 ENCOUNTER — Emergency Department
Admission: EM | Admit: 2021-07-26 | Discharge: 2021-07-26 | Disposition: A | Discharge status: Home / Self Care | Payer: Medicare Other | Source: Home / Self Care

## 2021-07-26 ENCOUNTER — Other Ambulatory Visit: Payer: Self-pay

## 2021-07-26 DIAGNOSIS — R110 Nausea: Secondary | ICD-10-CM | POA: Diagnosis not present

## 2021-07-26 DIAGNOSIS — Z886 Allergy status to analgesic agent status: Secondary | ICD-10-CM | POA: Diagnosis not present

## 2021-07-26 DIAGNOSIS — R42 Dizziness and giddiness: Secondary | ICD-10-CM

## 2021-07-26 DIAGNOSIS — Z7951 Long term (current) use of inhaled steroids: Secondary | ICD-10-CM | POA: Diagnosis not present

## 2021-07-26 DIAGNOSIS — E119 Type 2 diabetes mellitus without complications: Secondary | ICD-10-CM | POA: Diagnosis not present

## 2021-07-26 DIAGNOSIS — J45909 Unspecified asthma, uncomplicated: Secondary | ICD-10-CM | POA: Diagnosis not present

## 2021-07-26 DIAGNOSIS — Z91041 Radiographic dye allergy status: Secondary | ICD-10-CM | POA: Diagnosis not present

## 2021-07-26 DIAGNOSIS — Z888 Allergy status to other drugs, medicaments and biological substances status: Secondary | ICD-10-CM | POA: Diagnosis not present

## 2021-07-26 DIAGNOSIS — R739 Hyperglycemia, unspecified: Secondary | ICD-10-CM

## 2021-07-26 DIAGNOSIS — Z7985 Long-term (current) use of injectable non-insulin antidiabetic drugs: Secondary | ICD-10-CM | POA: Diagnosis not present

## 2021-07-26 DIAGNOSIS — R079 Chest pain, unspecified: Secondary | ICD-10-CM | POA: Diagnosis not present

## 2021-07-26 DIAGNOSIS — M79622 Pain in left upper arm: Secondary | ICD-10-CM | POA: Diagnosis not present

## 2021-07-26 DIAGNOSIS — Z794 Long term (current) use of insulin: Secondary | ICD-10-CM | POA: Diagnosis not present

## 2021-07-26 DIAGNOSIS — R0789 Other chest pain: Secondary | ICD-10-CM | POA: Diagnosis not present

## 2021-07-26 DIAGNOSIS — Z9049 Acquired absence of other specified parts of digestive tract: Secondary | ICD-10-CM | POA: Diagnosis not present

## 2021-07-26 DIAGNOSIS — Z91048 Other nonmedicinal substance allergy status: Secondary | ICD-10-CM | POA: Diagnosis not present

## 2021-07-26 DIAGNOSIS — I10 Essential (primary) hypertension: Secondary | ICD-10-CM | POA: Diagnosis not present

## 2021-07-26 DIAGNOSIS — Z79899 Other long term (current) drug therapy: Secondary | ICD-10-CM | POA: Diagnosis not present

## 2021-07-26 NOTE — ED Provider Notes (Signed)
?[facility] ? ? ? ?CSN: [id number] ?Arrival date & time: 07/26/21  4098 ? ? ?  ? ?History   ?Chief Complaint ?Chief Complaint  ?Patient presents with  ? Dizziness  ?  Dizziness, nausea, bradycardia and left shoulder pain. X2 days  ? ? ?HPI ?Ann Torres is a 67 y.o. female.  ? ?HPI 66 year old female presents with nausea, dizziness, low heart rate, and left shoulder pain for 2 days.  PMH significant for HTN, T2DM, HDL, memory impairment, and rheumatoid arthritis. ? ?History reviewed. No pertinent past medical history. ? ?Patient Active Problem List  ? Diagnosis Date Noted  ? History of herpes genitalis 05/11/2021  ? Vaginal atrophy 05/11/2021  ? Vaginal irritation 05/11/2021  ? Lumbar spinal stenosis with right-sided radiculitis 01/12/2021  ? Osteopenia 08/27/2020  ? Allergy to honey bee venom 08/04/2020  ? Memory impairment 12/06/2019  ? Trigger finger 08/07/2019  ? Primary insomnia 03/23/2018  ? Long-term use of high-risk medication 02/06/2018  ? Calcific Achilles tendinitis of left lower extremity 10/06/2017  ? Haglund's deformity of left heel 10/06/2017  ? Essential hypertension 02/01/2017  ? Rheumatoid arthritis involving multiple sites (HCC) 02/01/2017  ? Diabetes mellitus without complication (HCC) 02/01/2017  ? History of colon polyps 02/01/2017  ? Depression, major, single episode, complete remission (HCC) 02/01/2017  ? Hyperlipidemia 02/01/2017  ? ? ?Past Surgical History:  ?Procedure Laterality Date  ? BACK SURGERY    ? BILATERAL OOPHORECTOMY    ? BREAST BIOPSY    ? in pts 30s  ? BREAST CYST EXCISION    ? Pt unsure which breast & unable to find a scar  ? CESAREAN SECTION    ? x 3  ? CHOLECYSTECTOMY    ? TOTAL ABDOMINAL HYSTERECTOMY    ? TUBAL LIGATION    ? ? ?OB History   ?No obstetric history on file. ?  ? ? ? ?Home Medications   ? ?Prior to Admission medications   ?Medication Sig Start Date End Date Taking? Authorizing Provider  ?Abatacept 125 MG/ML SOAJ Inject 1 Dose into the skin once a  week. Sundays 01/12/18  Yes [provider]  ?albuterol (VENTOLIN HFA) 108 (90 Base) MCG/ACT inhaler Inhale 2 puffs into the lungs every 6 (six) hours as needed for wheezing or shortness of breath. 06/26/21  Yes [name], MD  ?AMBULATORY NON FORMULARY MEDICATION Rolling walker, please take Rx to medical supply store. 03/18/21  Yes Monica Becton, MD  ?atorvastatin (LIPITOR) 20 MG tablet Take 1 tablet (20 mg total) by mouth at bedtime. 11/26/20  Yes [name], MD  ?Calcium-Vitamin D-Vitamin K (VIACTIV CALCIUM PLUS D) 650-12.5-40 MG-MCG-MCG CHEW Chew 2 tablets by mouth in the morning and at bedtime.   Yes [provider]  ?cyclobenzaprine (FLEXERIL) 10 MG tablet One half to one tab PO qHS, then increase gradually to one tab TID. 01/12/21  Yes Monica Becton, MD  ?Dulaglutide (TRULICITY) 1.5 MG/0.5ML SOPN INJECT 1.5 MG UNDER THE SKIN EVERY 7 DAYS. 11/26/20  Yes [name], MD  ?EPINEPHrine 0.3 mg/0.3 mL IJ SOAJ injection Inject 0.3 mg into the muscle as needed for anaphylaxis. 08/04/20  Yes [name], MD  ?estradiol (ESTRACE VAGINAL) 0.1 MG/GM vaginal cream Place 1 Applicatorful vaginally at bedtime. For 2 weeks then 3 times a week. 05/08/21  Yes Breeback, Jade L, PA-C  ?insulin lispro (HUMALOG) 100 UNIT/ML injection Inject 0.05-0.1 mLs (5-10 Units total) into the skin 3 (three) times daily before meals. 03/03/21  Yes Agapito GamesMetheney, Catherine D, MD  ?insulin lispro protamine-lispro (HUMALOG MIX 75/25) (75-25) 100 UNIT/ML SUSP injection 20 units SQ QAM and 18 unit each evening. 03/03/21  Yes Agapito GamesMetheney, Catherine D, MD  ?Insulin Pen Needle (TRUEPLUS PEN NEEDLES) 31G X 6 MM MISC Use to inject insulin. 11/26/20  Yes Agapito GamesMetheney, Catherine D, MD  ?INSULIN SYRINGE 1CC/29G 29G X 1/2" 1 ML MISC Use up to TID for insulin. Ok to sub if needed as covered by insurance. 03/03/21  Yes Agapito GamesMetheney, Catherine D, MD  ?naproxen (NAPROSYN) 500 MG tablet Take 1 tablet (500 mg total)  by mouth 2 (two) times daily with a meal. 01/12/21 01/12/22 Yes Monica Bectonhekkekandam, Thomas J, MD  ?omeprazole (PRILOSEC) 20 MG capsule Take 1 capsule (20 mg total) by mouth daily. 11/26/20  Yes Agapito GamesMetheney, Catherine D, MD  ?telmisartan (MICARDIS) 80 MG tablet Take 1 tablet (80 mg total) by mouth daily. 05/22/21  Yes Agapito GamesMetheney, Catherine D, MD  ?tiZANidine (ZANAFLEX) 2 MG tablet Take 2 mg by mouth every 8 (eight) hours as needed. 07/21/21  Yes [provider]  ?traMADol (ULTRAM) 50 MG tablet Take 1-2 tablets (50-100 mg total) by mouth every 8 (eight) hours as needed for moderate pain. Maximum 6 tabs per day. 02/26/21  Yes Monica Bectonhekkekandam, Thomas J, MD  ? ? ?Family History ?Family History  ?Problem Relation Age of Onset  ? Colon cancer Mother   ? Heart attack Father 141  ? ? ?Social History ?Social History  ? ?Tobacco Use  ? Smoking status: Former  ?  Types: Cigarettes  ?  Quit date: 08/01/2017  ?  Years since quitting: 3.9  ? Smokeless tobacco: Never  ?Substance Use Topics  ? Alcohol use: No  ? Drug use: No  ? ? ? ?Allergies   ?Bee venom, Lisinopril, Azathioprine, Celecoxib, Metformin and related, Red dye, Enbrel [etanercept], Farxiga [dapagliflozin], Hydroxychloroquine, Methotrexate derivatives, and Tape ? ? ?Review of Systems ?Review of Systems  ?Cardiovascular:   ?     Lower heart rate x2 days  ?Gastrointestinal:  Positive for nausea.  ?Musculoskeletal:   ?     Left shoulder pain x2 days  ?Neurological:  Positive for dizziness.  ?All other systems reviewed and are negative. ? ? ?Physical Exam ?Triage Vital Signs ?ED Triage Vitals  ?Enc Vitals Group  ?   BP 07/26/21 0828 136/88  ?   Pulse Rate 07/26/21 0828 92  ?   Resp 07/26/21 0828 18  ?   Temp 07/26/21 0828 98 ?F (36.7 ?C)  ?   Temp src --   ?   SpO2 07/26/21 0828 98 %  ?   Weight 07/26/21 0824 165 lb (74.8 kg)  ?   Height 07/26/21 0824 5\' 4"  (1.626 m)  ?   Head Circumference --   ?   Peak Flow --   ?   Pain Score 07/26/21 0824 6  ?   Pain Loc --   ?   Pain Edu? --   ?    Excl. in GC? --   ? ?No data found. ? ?Updated Vital Signs ?BP 136/88 (BP Location: Left Arm)   Pulse 92   Temp 98 ?F (36.7 ?C)   Resp 18   Ht 5\' 4"  (1.626 m)   Wt 165 lb (74.8 kg)   SpO2 98%   BMI 28.32 kg/m?  ? ?  ? ?Physical Exam ?Vitals and nursing note reviewed.  ?Constitutional:   ?   Appearance: She is obese.  ?HENT:  ?  Head: Normocephalic and atraumatic.  ?   Right Ear: Tympanic membrane, ear canal and external ear normal.  ?   Left Ear: Tympanic membrane, ear canal and external ear normal.  ?   Mouth/Throat:  ?   Mouth: Mucous membranes are moist.  ?   Pharynx: Oropharynx is clear.  ?Eyes:  ?   Extraocular Movements: Extraocular movements intact.  ?   Conjunctiva/sclera: Conjunctivae normal.  ?   Pupils: Pupils are equal, round, and reactive to light.  ?Neck:  ?   Comments: No JVD, no bruit ?Cardiovascular:  ?   Rate and Rhythm: Normal rate and regular rhythm.  ?   Pulses: Normal pulses.  ?   Heart sounds: Normal heart sounds. No murmur heard. ?  No friction rub. No gallop.  ?Pulmonary:  ?   Effort: Pulmonary effort is normal. No respiratory distress.  ?   Breath sounds: Normal breath sounds. No wheezing, rhonchi or rales.  ?Musculoskeletal:     ?   General: Normal range of motion.  ?   Cervical back: Normal range of motion and neck supple.  ?Skin: ?   General: Skin is warm and dry.  ?Neurological:  ?   General: No focal deficit present.  ?   Mental Status: She is alert and oriented to person, place, and time. Mental status is at baseline.  ?   Cranial Nerves: No cranial nerve deficit.  ?   Sensory: No sensory deficit.  ?Psychiatric:  ?   Comments: Anxious and tearful  ? ? ? ?UC Treatments / Results  ?Labs ?(all labs ordered are listed, but only abnormal results are displayed) ?Labs Reviewed - No data to display ? ?EKG ? ? ?Radiology ?No results found. ? ?Procedures ?Procedures (including critical care time) ? ?Medications Ordered in UC ?Medications - No data to display ? ?Initial Impression /  Assessment and Plan / UC Course  ?I have reviewed the triage vital signs and the nursing notes. ? ?Pertinent labs & imaging results that were available during my care of the patient were reviewed by me and consid

## 2021-07-26 NOTE — ED Triage Notes (Signed)
Pt states that  she has some dizziness, nausea, brady cardia and left shoulder pain. X2 days ? ?Pt states that her shoulder pain radiates down her left arm.  ?

## 2021-07-26 NOTE — Discharge Instructions (Addendum)
Patient has been instructed to go to [facility] now for further evaluation of dizziness to include imaging, and serological testing. Advised patient her current symptoms are concerning for unstable angina and hyperglycemia. Patient's family members will be driving patient to [facility]. ?

## 2021-07-28 ENCOUNTER — Encounter: Payer: Self-pay | Admitting: Family Medicine

## 2021-07-28 ENCOUNTER — Other Ambulatory Visit: Payer: Self-pay | Admitting: Physician Assistant

## 2021-07-28 ENCOUNTER — Ambulatory Visit (INDEPENDENT_AMBULATORY_CARE_PROVIDER_SITE_OTHER): Payer: Medicare Other | Admitting: Family Medicine

## 2021-07-28 ENCOUNTER — Other Ambulatory Visit: Payer: Self-pay

## 2021-07-28 VITALS — BP 135/65 | HR 69 | Resp 18 | Ht 64.0 in | Wt 168.0 lb

## 2021-07-28 DIAGNOSIS — I10 Essential (primary) hypertension: Secondary | ICD-10-CM | POA: Diagnosis not present

## 2021-07-28 DIAGNOSIS — H6123 Impacted cerumen, bilateral: Secondary | ICD-10-CM | POA: Diagnosis not present

## 2021-07-28 DIAGNOSIS — E119 Type 2 diabetes mellitus without complications: Secondary | ICD-10-CM

## 2021-07-28 DIAGNOSIS — R0789 Other chest pain: Secondary | ICD-10-CM

## 2021-07-28 DIAGNOSIS — H8110 Benign paroxysmal vertigo, unspecified ear: Secondary | ICD-10-CM

## 2021-07-28 DIAGNOSIS — Z91030 Bee allergy status: Secondary | ICD-10-CM | POA: Diagnosis not present

## 2021-07-28 DIAGNOSIS — N952 Postmenopausal atrophic vaginitis: Secondary | ICD-10-CM | POA: Diagnosis not present

## 2021-07-28 LAB — POCT GLYCOSYLATED HEMOGLOBIN (HGB A1C): Hemoglobin A1C: 6.2 % — AB (ref 4.0–5.6)

## 2021-07-28 MED ORDER — ESTRADIOL 0.1 MG/GM VA CREA: 1.0000 | TOPICAL_CREAM | Freq: Every day | VAGINAL | 3 refills | Status: AC

## 2021-07-28 MED ORDER — EPINEPHRINE 0.3 MG/0.3ML IJ SOAJ: 0.3000 mg | INTRAMUSCULAR | 1 refills | Status: AC | PRN

## 2021-07-28 NOTE — Progress Notes (Signed)
? ?Established Patient Office Visit ? ?Subjective:  ?Patient ID: Ann Torres, female    DOB: 03/05/1956  Age: 66 y.o. MRN: [id number] ? ?CC:  ?Chief Complaint  ?Patient presents with  ? Diabetes  ?  Follow up   ? ER Follow up   ?  Dizziness. Was prescribed Meclizine in the ER wit no improvement.   ? Diabetes Eye Exam  ?  Done 01/2021 at [facility]   ? ? ?HPI ?Ann Torres presents for ED follow up.   ? ?Hypertension- Pt denies chest pain, SOB, dizziness, or heart palpitations.  Taking meds as directed w/o problems.  Denies medication side effects.   ? ?Diabetes - no hypoglycemic events. No wounds or sores that are not healing well. No increased thirst or urination. Checking glucose at home. Taking medications as prescribed without any side effects. ? ?She was also seen in the emergency department 2 days ago March 26 for chest pain.  And dizziness.  And left upper arm pain.  He has been referred to cardiology and has a follow-up appointment tomorrow.  Chest x-ray was normal.  Troponin was normal.  Electrolytes were normal. Note reviewed from the ED.  ? ?No past medical history on file. ? ?Past Surgical History:  ?Procedure Laterality Date  ? BACK SURGERY    ? BILATERAL OOPHORECTOMY    ? BREAST BIOPSY    ? in pts 30s  ? BREAST CYST EXCISION    ? Pt unsure which breast & unable to find a scar  ? CESAREAN SECTION    ? x 3  ? CHOLECYSTECTOMY    ? TOTAL ABDOMINAL HYSTERECTOMY    ? TUBAL LIGATION    ? ? ?Family History  ?Problem Relation Age of Onset  ? Colon cancer Mother   ? Heart attack Father 5  ? ? ?Social History  ? ?Socioeconomic History  ? Marital status: Divorced  ?  Spouse name: Not on file  ? Number of children: Not on file  ? Years of education: 2 yr college  ? Highest education level: Not on file  ?Occupational History  ? Occupation: retired  ?Tobacco Use  ? Smoking status: Former  ?  Types: Cigarettes  ?  Quit date: 08/01/2017  ?  Years since quitting: 3.9  ? Smokeless tobacco: Never  ?Substance and  Sexual Activity  ? Alcohol use: No  ? Drug use: No  ? Sexual activity: Never  ?Other Topics Concern  ? Not on file  ?Social History Narrative  ? Looks for 15 minutes daily. Denies significant caffeine intake. Currently divorced and not sexually active. Currently living with her daughter and daughter's family.  ? ?Social Determinants of Health  ? ?Financial Resource Strain: Low Risk   ? Difficulty of Paying Living Expenses: Not hard at all  ?Food Insecurity: No Food Insecurity  ? Worried About Programme researcher, broadcasting/film/video in the Last Year: Never true  ? Ran Out of Food in the Last Year: Never true  ?Transportation Needs: No Transportation Needs  ? Lack of Transportation (Medical): No  ? Lack of Transportation (Non-Medical): No  ?Physical Activity: Inactive  ? Days of Exercise per Week: 0 days  ? Minutes of Exercise per Session: 0 min  ?Stress: No Stress Concern Present  ? Feeling of Stress : Not at all  ?Social Connections: Moderately Isolated  ? Frequency of Communication with Friends and Family: More than three times a week  ? Frequency of Social Gatherings with Friends  and Family: More than three times a week  ? Attends Religious Services: Never  ? Active Member of Clubs or Organizations: Yes  ? Attends Banker Meetings: 1 to 4 times per year  ? Marital Status: Divorced  ?Intimate Partner Violence: Not At Risk  ? Fear of Current or Ex-Partner: No  ? Emotionally Abused: No  ? Physically Abused: No  ? Sexually Abused: No  ? ? ?Outpatient Medications Prior to Visit  ?Medication Sig Dispense Refill  ? Abatacept 125 MG/ML SOAJ Inject 1 Dose into the skin once a week. Sundays    ? albuterol (VENTOLIN HFA) 108 (90 Base) MCG/ACT inhaler Inhale 2 puffs into the lungs every 6 (six) hours as needed for wheezing or shortness of breath. 8.5 g 2  ? AMBULATORY NON FORMULARY MEDICATION Rolling walker, please take Rx to medical supply store. 1 each 0  ? atorvastatin (LIPITOR) 20 MG tablet Take 1 tablet (20 mg total) by mouth  at bedtime. 90 tablet 3  ? Calcium-Vitamin D-Vitamin K (VIACTIV CALCIUM PLUS D) 650-12.5-40 MG-MCG-MCG CHEW Chew 2 tablets by mouth in the morning and at bedtime.    ? cyclobenzaprine (FLEXERIL) 10 MG tablet One half to one tab PO qHS, then increase gradually to one tab TID. 30 tablet 0  ? Dulaglutide (TRULICITY) 1.5 MG/0.5ML SOPN INJECT 1.5 MG UNDER THE SKIN EVERY 7 DAYS. 6 mL 4  ? insulin lispro (HUMALOG) 100 UNIT/ML injection Inject 0.05-0.1 mLs (5-10 Units total) into the skin 3 (three) times daily before meals. 10 mL 5  ? insulin lispro protamine-lispro (HUMALOG MIX 75/25) (75-25) 100 UNIT/ML SUSP injection 20 units SQ QAM and 18 unit each evening. 20 mL 5  ? Insulin Pen Needle (TRUEPLUS PEN NEEDLES) 31G X 6 MM MISC Use to inject insulin. 100 each 10  ? INSULIN SYRINGE 1CC/29G 29G X 1/2" 1 ML MISC Use up to TID for insulin. Ok to sub if needed as covered by insurance. 200 each 3  ? meclizine (ANTIVERT) 25 MG tablet Take by mouth.    ? naproxen (NAPROSYN) 500 MG tablet Take 1 tablet (500 mg total) by mouth 2 (two) times daily with a meal. 60 tablet 3  ? omeprazole (PRILOSEC) 20 MG capsule Take 1 capsule (20 mg total) by mouth daily. 90 capsule 3  ? telmisartan (MICARDIS) 80 MG tablet Take 1 tablet (80 mg total) by mouth daily. 90 tablet 1  ? tiZANidine (ZANAFLEX) 2 MG tablet Take 2 mg by mouth every 8 (eight) hours as needed.    ? traMADol (ULTRAM) 50 MG tablet Take 1-2 tablets (50-100 mg total) by mouth every 8 (eight) hours as needed for moderate pain. Maximum 6 tabs per day. 21 tablet 0  ? EPINEPHrine 0.3 mg/0.3 mL IJ SOAJ injection Inject 0.3 mg into the muscle as needed for anaphylaxis. 1 each 1  ? estradiol (ESTRACE VAGINAL) 0.1 MG/GM vaginal cream Place 1 Applicatorful vaginally at bedtime. For 2 weeks then 3 times a week. 42.5 g 1  ? ?No facility-administered medications prior to visit.  ? ? ?Allergies  ?Allergen Reactions  ? Bee Venom Anaphylaxis and Hives  ? Lisinopril Shortness Of Breath  ?  Azathioprine Nausea And Vomiting  ? Celecoxib Other (See Comments)  ?  Patient reported  ? Metformin And Related Other (See Comments)  ?  Diarrhea and vomiting  ? Red Dye Other (See Comments)  ? Enbrel [Etanercept] Other (See Comments)  ?  ENT felt was causing hearing loss  ?  Marcelline Deist [Dapagliflozin] Other (See Comments)  ?  Recurrent yeast infections  ? Hydroxychloroquine Rash  ? Methotrexate Derivatives Rash  ? Tape Rash  ?  r  ? ? ?ROS ?Review of Systems ? ?  ?Objective:  ?  ?Physical Exam ?Constitutional:   ?   Appearance: Normal appearance. She is well-developed.  ?HENT:  ?   Head: Normocephalic and atraumatic.  ?   Right Ear: External ear normal.  ?   Left Ear: External ear normal.  ?   Ears:  ?   Comments: Both canals blocked by cerumen ?   Nose: Nose normal.  ?   Mouth/Throat:  ?   Mouth: Mucous membranes are moist.  ?   Pharynx: Oropharynx is clear. No oropharyngeal exudate or posterior oropharyngeal erythema.  ?Eyes:  ?   Conjunctiva/sclera: Conjunctivae normal.  ?   Pupils: Pupils are equal, round, and reactive to light.  ?Neck:  ?   Thyroid: No thyromegaly.  ?Cardiovascular:  ?   Rate and Rhythm: Normal rate and regular rhythm.  ?   Heart sounds: Normal heart sounds.  ?Pulmonary:  ?   Effort: Pulmonary effort is normal.  ?   Breath sounds: Normal breath sounds. No wheezing.  ?Musculoskeletal:  ?   Cervical back: Neck supple.  ?Lymphadenopathy:  ?   Cervical: No cervical adenopathy.  ?Skin: ?   General: Skin is warm and dry.  ?Neurological:  ?   Mental Status: She is alert and oriented to person, place, and time.  ?   Comments: Dix-Hallpike maneuver performed.  Recreated her symptoms more intensely to the right compared to the left.  But she had some slight nystagmus when going to her left.  ?Psychiatric:     ?   Behavior: Behavior normal.  ? ?BP 135/65   Pulse 69   Resp 18   Ht  (1.626 m)   Wt 168 lb (76.2 kg)   SpO2 98%   BMI 28.84 kg/m?  ?Wt Readings from Last 3 Encounters:  ?07/28/21 168 lb  (76.2 kg)  ?07/26/21 165 lb (74.8 kg)  ?05/08/21 164 lb 0.6 oz (74.4 kg)  ? ? ? ?Health Maintenance Due  ?Topic Date Due  ? OPHTHALMOLOGY EXAM  03/12/2021  ? ? ?There are no preventive care reminders to display for t

## 2021-07-28 NOTE — Assessment & Plan Note (Signed)
A1c looks fantastic at 6.2 today.  Continue current regimen.  Plan to follow-up in 3 to 4 months. ?

## 2021-07-28 NOTE — Assessment & Plan Note (Signed)
Well controlled. Continue current regimen. Follow up in  6 mo  

## 2021-07-29 DIAGNOSIS — R079 Chest pain, unspecified: Secondary | ICD-10-CM | POA: Diagnosis not present

## 2021-07-31 ENCOUNTER — Ambulatory Visit: Payer: Medicare Other | Attending: Family Medicine | Admitting: Physical Therapy

## 2021-07-31 ENCOUNTER — Encounter: Payer: Self-pay | Admitting: Physical Therapy

## 2021-07-31 DIAGNOSIS — R42 Dizziness and giddiness: Secondary | ICD-10-CM | POA: Diagnosis not present

## 2021-07-31 DIAGNOSIS — H8110 Benign paroxysmal vertigo, unspecified ear: Secondary | ICD-10-CM | POA: Insufficient documentation

## 2021-07-31 NOTE — Therapy (Signed)
[place] ?[facility] ?[place] ?[place], [place], [address] ?Phone: [phone]   Fax:  [phone] ? ?Physical Therapy Evaluation ? ?Patient Details  ?Name: Ann Torres ?MRN: [id number] ?Date of Birth: 09-20-1955 ?Referring Provider (PT): Beatrice Lecher ? ? ?Encounter Date: 07/31/2021 ? ? PT End of Session - 07/31/21 1442   ? ? Visit Number 1   ? Number of Visits 6   ? Date for PT Re-Evaluation 09/11/21   ? Authorization Type U[employer] Medicare   ? Authorization - Visit Number 1   ? Progress Note Due on Visit 10   ? PT Start Time 1355   ? PT Stop Time 1440   ? PT Time Calculation (min) 45 min   ? Activity Tolerance Patient tolerated treatment well   ? Behavior During Therapy [facility] for tasks assessed/performed   ? ?  ?  ? ?  ? ? ?History reviewed. No pertinent past medical history. ? ?Past Surgical History:  ?Procedure Laterality Date  ? BACK SURGERY    ? BILATERAL OOPHORECTOMY    ? BREAST BIOPSY    ? in pts 13s  ? BREAST CYST EXCISION    ? Pt unsure which breast & unable to find a scar  ? CESAREAN SECTION    ? x 3  ? CHOLECYSTECTOMY    ? TOTAL ABDOMINAL HYSTERECTOMY    ? TUBAL LIGATION    ? ? ?There were no vitals filed for this visit. ? ? ? Subjective Assessment - 07/31/21 1353   ? ? Subjective 5 days ago pt felt dizzy and had LBP all day. That evening she had chest pain as well as dizziness and lightheadedness and went to ED. Pt was diagnosed with post Covid heart attack. She states she has been cleared by cardiology and her BP has returned to normal . Pt followed up with PCP 07/28/21 and was diagnosed with BPPV. Dizziness is worse getting up from laying down, sudden head movements and when she wakes in the morning.   ? Pertinent History post covid heart attack, lumbar laminectomy and foraminectomy 06/05/21   ? Limitations House hold activities   ? Patient Stated Goals decreased dizziness, be able to drive   ? Currently in Pain? No/denies   ? ?  ?  ? ?  ? ? ? ? ?  [facility] PT Assessment - 07/31/21 0001   ? ?  ? Assessment  ? Medical Diagnosis BPPV   ? Referring Provider (PT) Beatrice Lecher   ? Onset Date/Surgical Date 07/26/21   ? Next MD Visit 10/28/21   ? Prior Therapy for lumbar pain   ?  ? Precautions  ? Precautions None   ?  ? Restrictions  ? Weight Bearing Restrictions No   ?  ? Balance Screen  ? Has the patient fallen in the past 6 months Yes   ? How many times? multiple prior to lumbar surgery   ?  ? Home Environment  ? Additional Comments pt currently living with her daughter   ?  ? Prior Function  ? Level of Independence Independent   ? Vocation Retired   ?  ? Observation/Other Assessments  ? Focus on Therapeutic Outcomes (FOTO)  not applicable   ? ?  ?  ? ?  ? ? ? ? ? ? ? ? ? Vestibular Assessment - 07/31/21 0001   ? ?  ? Symptom Behavior  ? Subjective history of current problem Dizziness x 5 days upon waking,  with quick head turns, supine to sit   ? Type of Dizziness  Spinning;Lightheadedness;Blurred vision   ? Frequency of Dizziness with position changes and upon waking   ? Duration of Dizziness 30 minutes   ? Symptom Nature Positional;Motion provoked   ? Aggravating Factors Mornings;Turning head quickly;Supine to sit   ? Relieving Factors Head stationary   ? Progression of Symptoms No change since onset   ?  ? Oculomotor Exam  ? Oculomotor Alignment Normal   ? Ocular ROM normal   ? Spontaneous Absent   ? Gaze-induced  Absent   ? Smooth Pursuits Intact   ? Saccades Intact   ?  ? Oculomotor Exam-Fixation Suppressed   ? Left Head Impulse normal   ? Right Head Impulse some catching up   ?  ? Vestibulo-Ocular Reflex  ? VOR 1 Head Only (x 1 viewing) no symptoms   ?  ? Positional Testing  ? Dix-Hallpike Dix-Hallpike Right;Dix-Hallpike Left   ? Sidelying Test Sidelying Right;Sidelying Left   ?  ? Dix-Hallpike Right  ? Dix-Hallpike Right Duration 60 seconds   ? Dix-Hallpike Right Symptoms No nystagmus   pt c/o feeling dizzy 9/10  ?  ? Dix-Hallpike Left  ? Dix-Hallpike  Left Duration >90 seconds   ? Dix-Hallpike Left Symptoms No nystagmus   pt reports dizziness 3/10  ?  ? Sidelying Right  ? Sidelying Right Duration 30 seconds   ? Sidelying Right Symptoms No nystagmus   dizziness 2/10  ?  ? Sidelying Left  ? Sidelying Left Duration 45 seconds   ? Sidelying Left Symptoms No nystagmus   3/10 dizziness  ?  ? Positional Sensitivities  ? Up from Right Hallpike Mild dizziness   ? Up from Left Hallpike Mild dizziness   ? ?  ?  ? ?  ? ? ? ? ? ?Objective measurements completed on examination: See above findings.  ? ? ? ? ? ? Vestibular Treatment/Exercise - 07/31/21 0001   ? ?  ? Vestibular Treatment/Exercise  ? Vestibular Treatment Provided Canalith Repositioning   ? Canalith Repositioning Semont Procedure Left Posterior   ?  ? Semont Procedure Left Posterior  ? Number of Reps  2   ? Overall Response Improved Symptoms   ? Response Details  decreased dizziness from 9/10 to 3/10   ? ?  ?  ? ?  ? ? ? ? ? ? ? ? ? PT Education - 07/31/21 1440   ? ? Education Details PT POC and goals, HEP   ? Person(s) Educated Patient   ? Methods Explanation;Demonstration;Handout   ? Comprehension Returned demonstration;Verbalized understanding   ? ?  ?  ? ?  ? ? ? ? ? ? PT Long Term Goals - 07/31/21 1446   ? ?  ? PT LONG TERM GOAL #1  ? Title Pt will be independent with HEP   ? Time 6   ? Period Weeks   ? Status New   ? Target Date 09/11/21   ?  ? PT LONG TERM GOAL #2  ? Title Pt will report dizziness <= 1/10 with quick head turns and with supine to sit   ? Time 6   ? Period Weeks   ? Status New   ? Target Date 09/11/21   ?  ? PT LONG TERM GOAL #3  ? Title Pt will report dizziness upon waking <= 1/10   ? Time 6   ? Period Weeks   ? Status New   ?  Target Date 09/11/21   ? ?  ?  ? ?  ? ? ? ? ? ? ? ? ? Plan - 07/31/21 1443   ? ? Clinical Impression Statement Pt is a 66 y/o female referred for BPPV. Pt presents with dizziness upon waking as well as with quick head turns. She is unable to drive or perform household  or leisure activities at this time. Pt presents with cupulothiasis of Lt posterior canal and bilat horizontal canals. She responds well to Semont maneuver and will benefit from further skilled PT to address deficits and improve mobility and decrease risk of falls.   ? Personal Factors and Comorbidities Time since onset of injury/illness/exacerbation;Comorbidity 1   ? Examination-Activity Limitations [place];Bed Mobility   ? Examination-Participation Restrictions Cleaning;Community Activity;Driving   ? Stability/Clinical Decision Making Evolving/Moderate complexity   ? Clinical Decision Making Moderate   ? Rehab Potential Good   ? PT Frequency 1x / week   ? PT Duration 6 weeks   ? PT Treatment/Interventions Canalith Repostioning;Therapeutic exercise;Balance training;Neuromuscular re-education;Therapeutic activities;Patient/family education;Manual techniques;Passive range of motion;Vestibular;Dry needling;Taping;Moist Heat;Cryotherapy;Electrical Stimulation;Iontophoresis 4mg /ml Dexamethasone   ? PT Next Visit Plan assess response to last visit, continue canalith repositioning as indicated   ? PT [place]   ? Consulted and Agree with Plan of Care Patient   ? ?  ?  ? ?  ? ? ?Patient will benefit from skilled therapeutic intervention in order to improve the following deficits and impairments:  Dizziness, Decreased mobility, Decreased activity tolerance ? ?Visit Diagnosis: ?Dizziness and giddiness - Plan: PT plan of care cert/re-cert ? ? ? ? ?Problem List ?Patient Active Problem List  ? Diagnosis Date Noted  ? History of herpes genitalis 05/11/2021  ? Vaginal atrophy 05/11/2021  ? Vaginal irritation 05/11/2021  ? Lumbar spinal stenosis with right-sided radiculitis 01/12/2021  ? Osteopenia 08/27/2020  ? Allergy to honey bee venom 08/04/2020  ? Memory impairment 12/06/2019  ? Trigger finger 08/07/2019  ? Primary insomnia 03/23/2018  ? Long-term use of high-risk medication 02/06/2018  ? Calcific Achilles  tendinitis of left lower extremity 10/06/2017  ? Haglund's deformity of left heel 10/06/2017  ? Essential hypertension 02/01/2017  ? Rheumatoid arthritis involving multiple sites ([place]) 02/01/2017  ? Diabetes m

## 2021-07-31 NOTE — Patient Instructions (Signed)
Access Code: [email or web address] ?URL: [email or web address] ?Date: 07/31/2021 ?Prepared by: Reggy Eye ? ?Patient Education ?- Left Semont ?

## 2021-08-05 ENCOUNTER — Ambulatory Visit: Payer: Medicare Other | Attending: Family Medicine | Admitting: Physical Therapy

## 2021-08-05 DIAGNOSIS — M25512 Pain in left shoulder: Secondary | ICD-10-CM | POA: Insufficient documentation

## 2021-08-05 DIAGNOSIS — M25612 Stiffness of left shoulder, not elsewhere classified: Secondary | ICD-10-CM | POA: Insufficient documentation

## 2021-08-05 DIAGNOSIS — M6281 Muscle weakness (generalized): Secondary | ICD-10-CM | POA: Diagnosis not present

## 2021-08-05 DIAGNOSIS — R42 Dizziness and giddiness: Secondary | ICD-10-CM | POA: Insufficient documentation

## 2021-08-05 DIAGNOSIS — R252 Cramp and spasm: Secondary | ICD-10-CM | POA: Diagnosis not present

## 2021-08-05 DIAGNOSIS — G8929 Other chronic pain: Secondary | ICD-10-CM | POA: Diagnosis not present

## 2021-08-05 NOTE — Patient Instructions (Signed)
Access Code: [email or web address] ?URL: [email or web address] ?Date: 08/05/2021 ?Prepared by: Isabelle Course ? ?Patient Education ?- Left Epley Maneuver ?- Right Epley Maneuver ?

## 2021-08-05 NOTE — Therapy (Signed)
[place] ?[facility] ?[place] ?[place], [place], [address] ?Phone: [phone]   Fax:  [phone] ? ?Physical Therapy Treatment ? ?Patient Details  ?Name: Ann Torres ?MRN: [id number] ?Date of Birth: 1955-05-29 ?Referring Provider (PT): Beatrice Lecher ? ? ?Encounter Date: 08/05/2021 ? ? PT End of Session - 08/05/21 [id number]   ? ? Visit Number 2   ? Number of Visits 6   ? Date for PT Re-Evaluation 09/11/21   ? Authorization Type UHC Medicare   ? Authorization - Visit Number 2   ? Progress Note Due on Visit 10   ? PT Start Time 0800   ? PT Stop Time 0840   ? PT Time Calculation (min) 40 min   ? Activity Tolerance Patient tolerated treatment well   ? Behavior During Therapy [facility] for tasks assessed/performed   ? ?  ?  ? ?  ? ? ?No past medical history on file. ? ?Past Surgical History:  ?Procedure Laterality Date  ? BACK SURGERY    ? BILATERAL OOPHORECTOMY    ? BREAST BIOPSY    ? in pts 86s  ? BREAST CYST EXCISION    ? Pt unsure which breast & unable to find a scar  ? CESAREAN SECTION    ? x 3  ? CHOLECYSTECTOMY    ? TOTAL ABDOMINAL HYSTERECTOMY    ? TUBAL LIGATION    ? ? ?There were no vitals filed for this visit. ? ? Subjective Assessment - 08/05/21 0804   ? ? Subjective Pt states she felt better for a day after last visit then dizziness returned. She states she did the maneuvers at home and they seemed to help for "1-2 hours" but then dizziness returned. Dizzy with laying down and with position changes   ? Patient Stated Goals decreased dizziness, be able to drive   ? Currently in Pain? No/denies   ? ?  ?  ? ?  ? ? ? ? ? ? ? ? ? ? Vestibular Assessment - 08/05/21 0001   ? ?  ? Dix-Hallpike Right  ? Dix-Hallpike Right Duration 70 seconds   ? Dix-Hallpike Right Symptoms No nystagmus   ?  ? Dix-Hallpike Left  ? Dix-Hallpike Left Duration 45 seconds   ? Dix-Hallpike Left Symptoms No nystagmus   ? ?  ?  ? ?  ? ? ? ? ? ? ? ? ? ? ? ? Vestibular Treatment/Exercise -  08/05/21 0001   ? ?  ? Vestibular Treatment/Exercise  ? Canalith Repositioning Epley Manuever Right;Epley Manuever Left   ?  ?  EPLEY MANUEVER RIGHT  ? Number of Reps  2   ? Overall Response Improved Symptoms   ? Response Details  from 8/10 dizziness to 4/10 dizziness   ?  ?  EPLEY MANUEVER LEFT  ? Number of Reps  2   ? Overall Response  Improved Symptoms   ?  RESPONSE DETAILS LEFT from 8/10 to 4/10   ? ?  ?  ? ?  ? ? ? ? ? ? ? ? ? PT Education - 08/05/21 0836   ? ? Education Details HEP   ? Person(s) Educated Patient   ? Methods Explanation;Demonstration;Handout   ? Comprehension Returned demonstration;Verbalized understanding   ? ?  ?  ? ?  ? ? ? ? ? ? PT Long Term Goals - 07/31/21 1446   ? ?  ? PT LONG TERM GOAL #1  ? Title Pt will be  independent with HEP   ? Time 6   ? Period Weeks   ? Status New   ? Target Date 09/11/21   ?  ? PT LONG TERM GOAL #2  ? Title Pt will report dizziness <= 1/10 with quick head turns and with supine to sit   ? Time 6   ? Period Weeks   ? Status New   ? Target Date 09/11/21   ?  ? PT LONG TERM GOAL #3  ? Title Pt will report dizziness upon waking <= 1/10   ? Time 6   ? Period Weeks   ? Status New   ? Target Date 09/11/21   ? ?  ?  ? ?  ? ? ? ? ? ? ? ? Plan - 08/05/21 0843   ? ? Clinical Impression Statement Pt continues with significant dizziness. She is positive bilat for [facility] with decreased duration since eval but continues with severe symptoms. She responds fairly well to Epley maneuver this visit and dizziness decreases from 8/10 to 4/10 during session. Pt given handout of Epley maneuver to try at home. She was instructed to go back to using Semont manuever if Epley is not providing symptom relief at home. Pt with significant dizziness with sidelying to sit and supine to sit. Pt states she is using multiple pillows at home and that this helps decrease dizziness.   ? PT Next Visit Plan assess response to last visit, continue canalith repositioning as indicated   ? PT [place]   ? Consulted and Agree with Plan of Care Patient   ? ?  ?  ? ?  ? ? ?Patient will benefit from skilled therapeutic intervention in order to improve the following deficits and impairments:    ? ?Visit Diagnosis: ?Dizziness and giddiness ? ? ? ? ?Problem List ?Patient Active Problem List  ? Diagnosis Date Noted  ? History of herpes genitalis 05/11/2021  ? Vaginal atrophy 05/11/2021  ? Vaginal irritation 05/11/2021  ? Lumbar spinal stenosis with right-sided radiculitis 01/12/2021  ? Osteopenia 08/27/2020  ? Allergy to honey bee venom 08/04/2020  ? Memory impairment 12/06/2019  ? Trigger finger 08/07/2019  ? Primary insomnia 03/23/2018  ? Long-term use of high-risk medication 02/06/2018  ? Calcific Achilles tendinitis of left lower extremity 10/06/2017  ? Haglund's deformity of left heel 10/06/2017  ? Essential hypertension 02/01/2017  ? Rheumatoid arthritis involving multiple sites ([place]) 02/01/2017  ? Diabetes mellitus without complication ([place]) [id number]  ? History of colon polyps 02/01/2017  ? Depression, major, single episode, complete remission ([place]) 02/01/2017  ? Hyperlipidemia 02/01/2017  ? ? ?Zikeria Keough, PT ?08/05/2021, 8:45 AM ? ?[place] ?[facility] ?[address] ?[place], [place], [address] ?Phone: [phone]   Fax:  [phone] ? ?Name: Ann Torres ?MRN: [id number] ?Date of Birth: [birth year] ? ? ? ?

## 2021-08-18 ENCOUNTER — Ambulatory Visit (INDEPENDENT_AMBULATORY_CARE_PROVIDER_SITE_OTHER): Payer: Medicare Other | Admitting: Family Medicine

## 2021-08-18 ENCOUNTER — Ambulatory Visit (INDEPENDENT_AMBULATORY_CARE_PROVIDER_SITE_OTHER): Payer: Medicare Other

## 2021-08-18 ENCOUNTER — Encounter: Payer: Self-pay | Admitting: Family Medicine

## 2021-08-18 ENCOUNTER — Ambulatory Visit: Payer: Medicare Other | Admitting: Rehabilitative and Restorative Service Providers"

## 2021-08-18 VITALS — BP 138/65 | HR 77 | Resp 16 | Ht 64.0 in | Wt 165.0 lb

## 2021-08-18 DIAGNOSIS — M6281 Muscle weakness (generalized): Secondary | ICD-10-CM | POA: Diagnosis not present

## 2021-08-18 DIAGNOSIS — G8929 Other chronic pain: Secondary | ICD-10-CM | POA: Diagnosis not present

## 2021-08-18 DIAGNOSIS — M25512 Pain in left shoulder: Secondary | ICD-10-CM

## 2021-08-18 DIAGNOSIS — R42 Dizziness and giddiness: Secondary | ICD-10-CM

## 2021-08-18 DIAGNOSIS — M069 Rheumatoid arthritis, unspecified: Secondary | ICD-10-CM

## 2021-08-18 DIAGNOSIS — R252 Cramp and spasm: Secondary | ICD-10-CM | POA: Diagnosis not present

## 2021-08-18 DIAGNOSIS — L03012 Cellulitis of left finger: Secondary | ICD-10-CM | POA: Diagnosis not present

## 2021-08-18 DIAGNOSIS — E119 Type 2 diabetes mellitus without complications: Secondary | ICD-10-CM | POA: Diagnosis not present

## 2021-08-18 DIAGNOSIS — M25612 Stiffness of left shoulder, not elsewhere classified: Secondary | ICD-10-CM | POA: Diagnosis not present

## 2021-08-18 DIAGNOSIS — M19012 Primary osteoarthritis, left shoulder: Secondary | ICD-10-CM | POA: Diagnosis not present

## 2021-08-18 MED ORDER — CEPHALEXIN 500 MG PO CAPS: 500.0000 mg | ORAL_CAPSULE | Freq: Two times a day (BID) | ORAL | 0 refills | Status: DC

## 2021-08-18 MED ORDER — MUPIROCIN 2 % EX OINT: TOPICAL_OINTMENT | Freq: Two times a day (BID) | CUTANEOUS | 0 refills | Status: DC

## 2021-08-18 NOTE — Assessment & Plan Note (Signed)
She reports that she is actually doing really well on her current regimen for her rheumatoid.  So she does not think it is affecting her shoulder. ?

## 2021-08-18 NOTE — Progress Notes (Signed)
? ?Acute Office Visit ? ?Subjective:  ? ?  ?Patient ID: Ann Torres, female    DOB: 04-01-1956, 66 y.o.   MRN: [id number] ? ?Chief Complaint  ?Patient presents with  ? Finger Concern  ?  Left ring finger swollen/painful, 2 weeks   ? Shoulder Pain  ?  Left shoulder. Patient would like to discuss options. Patient states pain radiates down left arm.  ? ? ?HPI ?Patient is in today for  ? ?Left ring finger swollen/painful, 2 weeks she said she did notice a little drainage at 1 point she had streaking towards the knuckle she but she did some Epsom salt soaks and was using triple antibiotic and this is a little better than it was. ? ?Reports some left shoulder pain that is been going on for at least 4 months she noticed initially was just sort of sleep on that shoulder and she have to get off of it but now she has noticed decreased range of motion for the last 2 weeks.  It is hard to lift anything past 90 degrees. ? ? ? ?ROS ? ? ?   ?Objective:  ?  ?BP 138/65   Pulse 77   Resp 16   Ht 5\' 4"  (1.626 m)   Wt 165 lb (74.8 kg)   SpO2 100%   BMI 28.32 kg/m?  ? ? ?Physical Exam ?Vitals reviewed.  ?Constitutional:   ?   Appearance: She is well-developed.  ?HENT:  ?   Head: Normocephalic and atraumatic.  ?Eyes:  ?   Conjunctiva/sclera: Conjunctivae normal.  ?Cardiovascular:  ?   Rate and Rhythm: Normal rate.  ?Pulmonary:  ?   Effort: Pulmonary effort is normal.  ?Musculoskeletal:  ?   Comments: Left shoulder is tender anteriorly which is mostly where her pain is located.  She is able to lift the shoulder to just shy of 90 degrees.  Normal external rotation.  She is able to reach behind her back and across her shoulder but it is painful to do so.  ?Skin: ?   General: Skin is dry.  ?   Coloration: Skin is not pale.  ?   Comments: Significant erythema at the base of the nail on the ring finger on the left hand.  I was unable to express any pus.  The skin looks like it has peeling where some of the swelling has actually already  gone down.  ?Neurological:  ?   Mental Status: She is alert and oriented to person, place, and time.  ?Psychiatric:     ?   Behavior: Behavior normal.  ? ? ?No results found for any visits on 08/18/21. ? ? ?   ?Assessment & Plan:  ? ?Problem List Items Addressed This Visit   ?None ?Visit Diagnoses   ? ? Paronychia of finger of left hand    -  Primary  ? Relevant Medications  ? cephALEXin (KEFLEX) 500 MG capsule  ? mupirocin ointment (BACTROBAN) 2 %  ? Acute pain of left shoulder      ? Relevant Orders  ? DG Shoulder Left  ? Ambulatory referral to Physical Therapy  ? ?  ? ? ?Left shoulder pain-consider bursitis versus rotator cuff injury.  No recent major fall or injury where she remembers hitting the shoulder.  We will start with plain film x-rays today since it has been going on for 4 months.  And will refer to physical therapy for further treatment and evaluation to see if we can rehab  the shoulder.  If not improving then consider further work-up such as MRI or getting her in with sports medicine. ? ?Paronychia of the ring finger on the left hand-we will treat with oral Keflex as well as mupirocin ointment.  If not improving over the next week then please let us know. ? ?Meds ordered this encounter  ?Medications  ? cephALEXin (KEFLEX) 500 MG capsule  ?  Sig: Take 1 capsule (500 mg total) by mouth 2 (two) times daily.  ?  Dispense:  14 capsule  ?  Refill:  0  ? mupirocin ointment (BACTROBAN) 2 %  ?  Sig: Apply topically 2 (two) times daily.  ?  Dispense:  15 g  ?  Refill:  0  ? ? ?No follow-ups on file. ? ?Nani Gasser, MD ? ? ?

## 2021-08-18 NOTE — Therapy (Addendum)
[place] ?[facility] ?[place] ?[place], [place], [address] ?Phone: [phone]   Fax:  [phone] ? ?Physical Therapy Treatment/ Discharge ? ?Patient Details  ?Name: Ann Torres ?MRN: [id number] ?Date of Birth: 05/19/1955 ?Referring Provider (PT): Beatrice Lecher ? ? ?Encounter Date: 08/18/2021 ? ? PT End of Session - 08/18/21 0909   ? ? Visit Number 3   ? Number of Visits 6   ? Date for PT Re-Evaluation 09/11/21   ? Authorization Type U[employer] Medicare   ? Authorization - Visit Number 3   ? Progress Note Due on Visit 10   ? PT Start Time [phone]   ? PT Stop Time 0909   ? PT Time Calculation (min) 25 min   ? Activity Tolerance Patient tolerated treatment well   ? Behavior During Therapy [facility] for tasks assessed/performed   ? ?  ?  ? ?  ? ? ?No past medical history on file. ? ?Past Surgical History:  ?Procedure Laterality Date  ? BACK SURGERY    ? BILATERAL OOPHORECTOMY    ? BREAST BIOPSY    ? in pts 8s  ? BREAST CYST EXCISION    ? Pt unsure which breast & unable to find a scar  ? CESAREAN SECTION    ? x 3  ? CHOLECYSTECTOMY    ? TOTAL ABDOMINAL HYSTERECTOMY    ? TUBAL LIGATION    ? ? ?There were no vitals filed for this visit. ? ? Subjective Assessment - 08/18/21 0846   ? ? Subjective The patient notes dizziness is better.  "I mostly notice it when I go to sleep or getting up in the middle of the night."  She is now experiencing it 1x per day. The patient reports she drove for short distances.   ? Pertinent History post covid heart attack, lumbar laminectomy and foraminectomy 06/05/21   ? Patient Stated Goals decreased dizziness, be able to drive   ? Currently in Pain? No/denies   ? ?  ?  ? ?  ? ? ? ? ? ? ? ? ? ? Vestibular Assessment - 08/18/21 0848   ? ?  ? Positional Testing  ? Dix-Hallpike Dix-Hallpike Right;Dix-Hallpike Left   ? Sidelying Test Sidelying Right;Sidelying Left   ? Horizontal Canal Testing Horizontal Canal Right;Horizontal Canal Left   ?  ?  Dix-Hallpike Left  ? Dix-Hallpike Left Duration 3/10 subjective reports, 20-30 seconds   ? Dix-Hallpike Left Symptoms Upbeat, left rotatory nystagmus   only viewed if using alexander's law having her look up/left  ? ?  ?  ? ?  ? ? ? ? ? ? ? ? ? ? ? [place] Adult PT Treatment/Exercise - 08/18/21 0911   ? ?  ? Self-Care  ? Self-Care Other Self-Care Comments   ? Other Self-Care Comments  self mgmt of symptoms   ? ?  ?  ? ?  ? ? Vestibular Treatment/Exercise - 08/18/21 0855   ? ?  ? Vestibular Treatment/Exercise  ? Vestibular Treatment Provided Canalith Repositioning;Habituation;Gaze   ? Canalith Repositioning Epley Manuever Left   ? Habituation Exercises Nestor Lewandowsky   ? Gaze Exercises X1 Viewing Horizontal;X1 Viewing Vertical   ?  ?  EPLEY MANUEVER LEFT  ? Number of Reps  1   ? Overall Response  Improved Symptoms   ?  RESPONSE DETAILS LEFT Patient initially noted 3/10 with first L dix hallpike for assessment.  When we moved into epley's maneuver, she notes 1/10 symptoms.   ?  ?  Laruth Bouchard Daroff  ? Number of Reps  1   ? Symptom Description  no symptoms provoked, therefore not provided for HEP   ?  ? X1 Viewing Horizontal  ? Foot Position standing   ? Reps 2   ? Comments cues for speed, visual fixation, and amplitude of motion; instructed for HEP-- patient notes visual blurring and difficulty holding gaze   ?  ? X1 Viewing Vertical  ? Foot Position standing   ? Reps 1   ? Comments did not provoke visual blurring/ horizontal more challenging   ? ?  ?  ? ?  ? ? ? ? ? ? ? ? ? PT Education - 08/18/21 0908   ? ? Education Details HEP, self mgmt of vertigo   ? Person(s) Educated Patient   ? Methods Explanation;Demonstration;Handout   ? Comprehension Verbalized understanding;Returned demonstration   ? ?  ?  ? ?  ? ? ? ? ? ? PT Long Term Goals - 08/18/21 0909   ? ?  ? PT LONG TERM GOAL #1  ? Title Pt will be independent with HEP   ? Time 6   ? Period Weeks   ? Status Achieved   ? Target Date 09/11/21   ?  ? PT LONG TERM GOAL #2  ?  Title Pt will report dizziness <= 1/10 with quick head turns and with supine to sit   ? Time 6   ? Period Weeks   ? Status Achieved   ? Target Date 09/11/21   ?  ? PT LONG TERM GOAL #3  ? Title Pt will report dizziness upon waking <= 1/10   ? Time 6   ? Period Weeks   ? Status Achieved   ? Target Date 09/11/21   ? ?  ?  ? ?  ? ? ? ? ? ? ? ? Plan - 08/18/21 0912   ? ? Clinical Impression Statement The patient has met LTGs at today's visit.  She notes now experiencing only trace symptoms with bed mobility at home and about 1x/day versus consistently throughout the day.  During today's visit, she has subjective report of dizziness with first rep of L dix hallpike of 3/10 with <5 beats of nystagmus noted.  This resolved with epley's maneuver.  PT progressed HEP to add gaze adaptation and will put on hold.  Pt to call back if symptoms recur or she needs any guidance with HEP over next 2 weeks.   ? PT Treatment/Interventions Canalith Repostioning;Therapeutic exercise;Balance training;Neuromuscular re-education;Therapeutic activities;Patient/family education;Manual techniques;Passive range of motion;Vestibular;Dry needling;Taping;Moist Heat;Cryotherapy;Electrical Stimulation;Iontophoresis 42m/ml Dexamethasone   ? PT Next Visit Plan on hold-- d/c if not returning in 2 weeks   ? PT [place]  ? Consulted and Agree with Plan of Care Patient   ? ?  ?  ? ?  ? ? ?Patient will benefit from skilled therapeutic intervention in order to improve the following deficits and impairments:  Dizziness, Decreased mobility, Decreased activity tolerance ? ?Visit Diagnosis: ?Dizziness and giddiness ? ? ? ? ?Problem List ?Patient Active Problem List  ? Diagnosis Date Noted  ? History of herpes genitalis 05/11/2021  ? Vaginal atrophy 05/11/2021  ? Vaginal irritation 05/11/2021  ? Lumbar spinal stenosis with right-sided radiculitis 01/12/2021  ? Osteopenia 08/27/2020  ? Allergy to honey bee venom 08/04/2020  ? Memory impairment  12/06/2019  ? Trigger finger 08/07/2019  ? Primary insomnia 03/23/2018  ? Long-term use of high-risk medication 02/06/2018  ?  Calcific Achilles tendinitis of left lower extremity 10/06/2017  ? Haglund's deformity of left heel 10/06/2017  ? Essential hypertension 02/01/2017  ? Rheumatoid arthritis involving multiple sites ([place]) 02/01/2017  ? Diabetes mellitus without complication ([place]) 32/99/2426  ? History of colon polyps 02/01/2017  ? Depression, major, single episode, complete remission ([place]) 02/01/2017  ? Hyperlipidemia 02/01/2017  ?PHYSICAL THERAPY DISCHARGE SUMMARY ? ?Visits from Start of Care: 3 ? ?Current functional level related to goals / functional outcomes: ?Decreased dizziness ?  ?Remaining deficits: ?See above ?  ?Education / Equipment: ?HEP  ? ?Patient agrees to discharge. Patient goals were met. Patient is being discharged due to meeting the stated rehab goals. ?Isabelle Course, PT,DPT04/26/238:56 AM ? ? ? ?Burr, PT ?08/18/2021, 9:13 AM ? ?[place] ?[facility] ?[place] ?[place], [place], [address] ?Phone: [phone]   Fax:  [phone] ? ?Name: BRICELYN FREESTONE ?MRN: [id number] ?Date of Birth: Sep 17, 1955 ? ? ? ?

## 2021-08-18 NOTE — Patient Instructions (Signed)
Access Code: [email or web address] ?URL: [email or web address] ?Date: 08/18/2021 ?Prepared by: Margretta Dittyhristina Omeka Holben ? ?Program Notes ?For the epley maneuver, ONLY do if spinning sensation returns or continues.  Gaze exercise (letter) should be done for about 6-8 weeks (thru mid June) or until it feels easy to perform. ? ?Exercises ?- Standing Gaze Stabilization with Head Rotation  - 2-3 x daily - 7 x weekly - 1 sets - 2 reps - 30-60 seconds hold ? ?Patient Education ?- Left Epley Maneuver ?- Right Epley Maneuver ?

## 2021-08-21 ENCOUNTER — Encounter: Payer: Self-pay | Admitting: Family Medicine

## 2021-08-21 NOTE — Progress Notes (Signed)
HI Ann Torres,  ?You have some mild arthritis at the glenohumeral join but no other worrisome finding.  If you are okay with it lets get you referred to physical therapy and let see if we can make some progress over the next 3 to 4 weeks.  Just let me know if you are okay with seeing our PT for the shoulder.

## 2021-08-25 ENCOUNTER — Ambulatory Visit: Payer: Medicare Other | Admitting: Rehabilitative and Restorative Service Providers"

## 2021-08-25 DIAGNOSIS — R079 Chest pain, unspecified: Secondary | ICD-10-CM | POA: Diagnosis not present

## 2021-08-25 DIAGNOSIS — M0579 Rheumatoid arthritis with rheumatoid factor of multiple sites without organ or systems involvement: Secondary | ICD-10-CM | POA: Diagnosis not present

## 2021-08-26 ENCOUNTER — Encounter: Payer: Self-pay | Admitting: Physical Therapy

## 2021-08-26 ENCOUNTER — Ambulatory Visit: Payer: Medicare Other | Admitting: Physical Therapy

## 2021-08-26 DIAGNOSIS — M6281 Muscle weakness (generalized): Secondary | ICD-10-CM

## 2021-08-26 DIAGNOSIS — M25612 Stiffness of left shoulder, not elsewhere classified: Secondary | ICD-10-CM

## 2021-08-26 DIAGNOSIS — R252 Cramp and spasm: Secondary | ICD-10-CM

## 2021-08-26 DIAGNOSIS — G8929 Other chronic pain: Secondary | ICD-10-CM

## 2021-08-26 DIAGNOSIS — M25512 Pain in left shoulder: Secondary | ICD-10-CM | POA: Diagnosis not present

## 2021-08-26 DIAGNOSIS — R42 Dizziness and giddiness: Secondary | ICD-10-CM | POA: Diagnosis not present

## 2021-08-26 NOTE — Patient Instructions (Signed)
Access Code: [email or web address] ?URL: [email or web address] ?Date: 08/26/2021 ?Prepared by: Raynelle Fanning ? ?Exercises ?- Shoulder Flexion Wall Slide with Towel  - 1 x daily - 7 x weekly - 3 sets - 10 reps ?- Standing Shoulder Abduction AAROM with Dowel  - 2 x daily - 7 x weekly - 3 sets - 10 reps - 3-5 sec hold ?- Doorway Pec Stretch at 60 Elevation  - 2 x daily - 7 x weekly - 1 sets - 2 reps - 30 sec hold ?- Seated Scapular Retraction  - 1 x daily - 7 x weekly - 1-3 sets - 10 reps - 2-3 sec hold ?

## 2021-08-26 NOTE — Therapy (Signed)
[place] ?[facility] ?[address] ?[place], [place], [address] ?Phone: [phone]   Fax:  [phone] ? ?Physical Therapy Evaluation ? ?Patient Details  ?Name: Ann Torres ?MRN: [id number] ?Date of Birth: 28-Feb-1956 ?Referring Provider (PT): Nani GasserMetheney, Catherine ? ? ?Encounter Date: 08/26/2021 ? ? PT End of Session - 08/26/21 0847   ? ? Visit Number 1   ? Number of Visits 12   ? Date for PT Re-Evaluation 10/07/21   ? Authorization Type UHC Medicare   ? Progress Note Due on Visit 10   ? PT Start Time 0848   ? PT Stop Time [phone]   ? PT Time Calculation (min) 40 min   ? Activity Tolerance Patient tolerated treatment well   ? Behavior [facility] tasks assessed/performed   ? ?  ?  ? ?  ? ? ?History reviewed. No pertinent past medical history. ? ?Past Surgical History:  ?Procedure Laterality Date  ? BACK SURGERY    ? BILATERAL OOPHORECTOMY    ? BREAST BIOPSY    ? in pts 30s  ? BREAST CYST EXCISION    ? Pt unsure which breast & unable to find a scar  ? CESAREAN SECTION    ? x 3  ? CHOLECYSTECTOMY    ? TOTAL ABDOMINAL HYSTERECTOMY    ? TUBAL LIGATION    ? ? ?There were no vitals filed for this visit. ? ? ? Subjective Assessment - 08/26/21 0849   ? ? Subjective Patient reporting BPPV is doing well. Just found out yesterday she had a cardiac event during Covid. She has been having left shoulder pain for 6-8 months. It hurts at 90 deg ABD.  [facility]. Worse at night and really hurts in the morning. Pt left handed. She's dropping things and can't hardly do anything with it anymore. Patient also reports numbing sensation down to wrist.   ? Pertinent History post covid heart attack, lumbar laminectomy and foraminectomy 06/05/21, BPPV, DM, RA   ? Patient Stated Goals decreased dizziness, be able to drive   ? Currently in Pain? Yes   ? Pain Score 8    ? Pain Location Shoulder   ? Pain Orientation Left;Anterior   ? Pain Descriptors / Indicators  Sharp;Constant;Aching   ? Pain Type Chronic pain   ? Pain Onset More than a month ago   ? Pain Frequency Constant   ? Aggravating Factors  OH   ? Pain Relieving Factors prescription strength [place]   ? Effect of Pain on Daily Activities limited with All   ? ?  ?  ? ?  ? ? ? ? ? [facility] PT Assessment - 08/26/21 0001   ? ?  ? Assessment  ? Medical Diagnosis left shoulder pain   ? Referring Provider (PT) Nani GasserMetheney, Catherine   ? Onset Date/Surgical Date 01/01/22   ? Hand Dominance Left   ? Next MD Visit 10/28/21   ? Prior Therapy lumbar and BPPV   ?  ? Precautions  ? Precautions None   ?  ? Restrictions  ? Weight Bearing Restrictions No   ?  ? Balance Screen  ? Has the patient fallen in the past 6 months Yes   ? How many times? multiple prior to lumbar surgery   ?  ? Home Environment  ? Living Environment Private residence   ? Living Arrangements Alone   ? Type of Home Apartment   ?  ? Prior  Function  ? Level of Independence Independent   ? Vocation Retired   ?  ? Observation/Other Assessments  ? Focus on Therapeutic Outcomes (FOTO)  44 (60 predicted)   ?  ? ROM / Strength  ? AROM / PROM / Strength AROM;Strength;PROM   ?  ? AROM  ? AROM Assessment Site Shoulder   ? Right/Left Shoulder Left   ? Left Shoulder Extension 66 Degrees   ? Left Shoulder Flexion 154 Degrees   ? Left Shoulder ABduction 123 Degrees   ? Left Shoulder Internal Rotation 73 Degrees   able to reach behind back without difficulty  ? Left Shoulder External Rotation 64 Degrees   able to place hand behind head  ?  ? PROM  ? PROM Assessment Site Shoulder   ? Right/Left Shoulder Left   ? Left Shoulder Flexion 170 Degrees   ? Left Shoulder ABduction 160 Degrees   ? Left Shoulder Internal Rotation 78 Degrees   ? Left Shoulder External Rotation 78 Degrees   ?  ? Strength  ? Overall Strength Comments pain with ABD   ? Strength Assessment Site Shoulder   ? Right/Left Shoulder Left   ? Left Shoulder Flexion 4+/5   ? Left Shoulder Extension 5/5   ? Left Shoulder  ABduction 2+/5   ? Left Shoulder Internal Rotation 4+/5   ? Left Shoulder External Rotation 5/5   mild pain  ?  ? Flexibility  ? Soft Tissue Assessment /Muscle Length --   tightness in pectoralis, subscap and Ex rotators, lats  ?  ? Palpation  ? Palpation comment subscap and IS tendons, UT, pectoralis   ?  ? Special Tests  ? Other special tests + HK, impingement; negative lift off test; negative median nerve tension in supine but positive in standing   ? ?  ?  ? ?  ? ? ? ? ? ? ? ? ? ? ? ? ? ?Objective measurements completed on examination: See above findings.  ? ? ? ? ? ? ? ? ? ? ? ? ? ? PT Education - 08/26/21 1937   ? ? Education Details HEP; POC   ? Person(s) Educated Patient   ? Methods Explanation;Demonstration;Handout   ? Comprehension Verbalized understanding;Returned demonstration   ? ?  ?  ? ?  ? ? ? ? ? ? PT Long Term Goals - 08/26/21 1947   ? ?  ? PT LONG TERM GOAL #1  ? Title Pt will be independent with HEP   ? Baseline -   ? Time 6   ? Period Weeks   ? Status New   ? Target Date 10/07/21   ?  ? PT LONG TERM GOAL #2  ? Title Improved left shoulder flex and ABD to [facility] to perform ADLS   ? Time 6   ? Period Weeks   ? Status New   ? Target Date 10/07/21   ?  ? PT LONG TERM GOAL #3  ? Title Improved left shoulder ABD strength to 4+/5 or better   ? Baseline -   ? Time 6   ? Period Weeks   ? Status New   ? Target Date 10/07/21   ?  ? PT LONG TERM GOAL #4  ? Title Deceased pain in the left shoulder with ADLS by >= 80%   ? Time 6   ? Period Weeks   ? Status New   ? Target Date 10/07/21   ?  ? PT LONG  TERM GOAL #5  ? Title Improved FOTO to 60 from 44 demonstrating improved function   ? Baseline -   ? Time 6   ? Period Weeks   ? Status New   ? Target Date 10/07/21   ? ?  ?  ? ?  ? ? ? ? ? ? ? ? ? Plan - 08/26/21 1937   ? ? Clinical Impression Statement Patient presents with c/o left shoulder pain x 6-8 months that is limiting most of her ADLS as she is left hand dominant. She pain primarily with OH flexion and ABD  beyond 90 deg. She has marked strength deficits with ABD as well. She also reports intermittent numbness in her left arm to her wrist which increases as her pain increases. She reports the pain is worse at night and in the morning when she awakes. She will benefit from skilled PT to address these deficits. Patient has been on hold from her BPPV treatment and reports she is doing well.   ? Personal Factors and Comorbidities Time since onset of injury/illness/exacerbation;Comorbidity 2   ? Comorbidities DM, RA   ? Examination-Activity Limitations Bathing;Carry;Hygiene/Grooming;Dressing;Lift;Reach Overhead;Sleep   ? Examination-Participation Restrictions Cleaning;Meal Prep   ? Stability/Clinical Decision Making Stable/Uncomplicated   ? Clinical Decision Making Low   ? Rehab Potential Excellent   ? PT Frequency 2x / week   ? PT Duration 6 weeks   ? PT Treatment/Interventions ADLs/Self Care Home Management;Cryotherapy;Electrical Stimulation;Iontophoresis /ml Dexamethasone;Moist Heat;Neuromuscular re-education;Therapeutic exercise;Therapeutic activities;Patient/family education;Manual techniques;Dry needling;Taping   ? PT Next Visit Plan Review HEP; DN to pectoralis, subscapularis, lats; ROM and post strengthening; D/C from BPPV episode if no increased sx by 09/01/21.   ? PT Home Exercise Plan [email or web address]   ? Consulted and Agree with Plan of Care Patient   ? ?  ?  ? ?  ? ? ?Patient will benefit from skilled therapeutic intervention in order to improve the following deficits and impairments:  Decreased range of motion, Impaired UE functional use, Increased muscle spasms, Pain, Impaired flexibility, Decreased strength, Impaired sensation ? ?Visit Diagnosis: ?Chronic left shoulder pain ? ?Stiffness of left shoulder, not elsewhere classified ? ?Muscle weakness (generalized) ? ?Cramp and spasm ? ? ? ? ?Problem List ?Patient Active Problem List  ? Diagnosis Date Noted  ? History of herpes genitalis 05/11/2021  ? Vaginal atrophy  05/11/2021  ? Vaginal irritation 05/11/2021  ? Lumbar spinal stenosis with right-sided radiculitis 01/12/2021  ? Osteopenia 08/27/2020  ? Allergy to honey bee venom 08/04/2020  ? Memory impairment 12/06/2019  ? Trigger

## 2021-08-31 ENCOUNTER — Encounter: Payer: Self-pay | Admitting: Rehabilitative and Restorative Service Providers"

## 2021-08-31 ENCOUNTER — Ambulatory Visit: Payer: Medicare Other | Attending: Family Medicine | Admitting: Rehabilitative and Restorative Service Providers"

## 2021-08-31 DIAGNOSIS — M25612 Stiffness of left shoulder, not elsewhere classified: Secondary | ICD-10-CM | POA: Diagnosis not present

## 2021-08-31 DIAGNOSIS — G8929 Other chronic pain: Secondary | ICD-10-CM | POA: Diagnosis not present

## 2021-08-31 DIAGNOSIS — M6281 Muscle weakness (generalized): Secondary | ICD-10-CM | POA: Diagnosis not present

## 2021-08-31 DIAGNOSIS — M25512 Pain in left shoulder: Secondary | ICD-10-CM | POA: Diagnosis not present

## 2021-08-31 DIAGNOSIS — R252 Cramp and spasm: Secondary | ICD-10-CM | POA: Insufficient documentation

## 2021-08-31 NOTE — Patient Instructions (Addendum)
Trigger Point Dry Needling ? ?What is Trigger Point Dry Needling (DN)? ?DN is a physical therapy technique used to treat muscle pain and dysfunction. Specifically, DN helps deactivate muscle trigger points (muscle knots).  ?A thin filiform needle is used to penetrate the skin and stimulate the underlying trigger point. The goal is for a local twitch response (LTR) to occur and for the trigger point to relax. No medication of any kind is injected during the procedure.  ? ?What Does Trigger Point Dry Needling Feel Like?  ?The procedure feels different for each individual patient. Some patients report that they do not actually feel the needle enter the skin and overall the process is not painful. Very mild bleeding may occur. However, many patients feel a deep cramping in the muscle in which the needle was inserted. This is the local twitch response.  ? ?How Will I feel after the treatment? ?Soreness is normal, and the onset of soreness may not occur for a few hours. Typically this soreness does not last longer than two days.  ?Bruising is uncommon, however; ice can be used to decrease any possible bruising.  ?In rare cases feeling tired or nauseous after the treatment is normal. In addition, your symptoms may get worse before they get better, this period will typically not last longer than 24 hours.  ? ?What Can I do After My Treatment? ?Increase your hydration by drinking more water for the next 24 hours. ?You may place ice or heat on the areas treated that have become sore, however, do not use heat on inflamed or bruised areas. Heat often brings more relief post needling. ?You can continue your regular activities, but vigorous activity is not recommended initially after the treatment for 24 hours. ?DN is best combined with other physical therapy such as strengthening, stretching, and other therapies.  ? ?Access Code: [email or web address] ?URL: [email or web address] ?Date: 08/31/2021 ?Prepared by: Corlis Leak ? ?Exercises ?- Shoulder Flexion Wall Slide with Towel  - 1 x daily - 7 x weekly - 3 sets - 10 reps ?- Standing Shoulder Abduction AAROM with Dowel  - 2 x daily - 7 x weekly - 3 sets - 10 reps - 3-5 sec hold ?- Doorway Pec Stretch at 60 Elevation  - 2 x daily - 7 x weekly - 1 sets - 2 reps - 30 sec hold ?- Seated Scapular Retraction  - 1 x daily - 7 x weekly - 1-3 sets - 10 reps - 2-3 sec hold ?- Seated Scapular Retraction  - 2 x daily - 7 x weekly - 1-2 sets - 10 reps - 10 sec  hold ?- Bicep Stretch at Table  - 2 x daily - 7 x weekly - 1 sets - 3 reps - 30 sec  hold ?- Supine Thoracic Mobilization Towel Roll Vertical with Arm Stretch  - 2 x daily - 7 x weekly - 1 sets - 1-3 reps - 2-3 min hold ?

## 2021-08-31 NOTE — Therapy (Signed)
[facility] ?[facility] ?[place] ?[place], [place], [address] ?Phone: [phone]   Fax:  [phone] ? ?Physical Therapy Treatment ? ?Patient Details  ?Name: Ann Torres ?MRN: [id number] ?Date of Birth: [birth year] ?Referring Provider (PT): Beatrice Lecher ? ? ?Encounter Date: 08/31/2021 ? ? PT End of Session - 08/31/21 0844   ? ? Visit Number 2   ? Number of Visits 12   ? Date for PT Re-Evaluation 10/07/21   ? Authorization Type UHC Medicare   ? Authorization - Visit Number 2   ? Progress Note Due on Visit 10   ? PT Start Time [id number]   ? PT Stop Time [id number]   ? PT Time Calculation (min) 48 min   ? Activity Tolerance Patient tolerated treatment well   ? ?  ?  ? ?  ? ? ?History reviewed. No pertinent past medical history. ? ?Past Surgical History:  ?Procedure Laterality Date  ? BACK SURGERY    ? BILATERAL OOPHORECTOMY    ? BREAST BIOPSY    ? in pts 1s  ? BREAST CYST EXCISION    ? Pt unsure which breast & unable to find a scar  ? CESAREAN SECTION    ? x 3  ? CHOLECYSTECTOMY    ? TOTAL ABDOMINAL HYSTERECTOMY    ? TUBAL LIGATION    ? ? ?There were no vitals filed for this visit. ? ? Subjective Assessment - 08/31/21 0845   ? ? Subjective Shoulder pain is about the same. She can lift the arm slightly higher than she could before starting exercises.   ? Currently in Pain? Yes   ? Pain Score 7    ? Pain Location Shoulder   ? Pain Orientation Left;Anterior   ? Pain Descriptors / Indicators Dull;Aching;Sharp   sharp with movement  ? Pain Type Chronic pain   ? Pain Onset More than a month ago   ? Pain Frequency Constant   ? ?  ?  ? ?  ? ? ? ? ? [facility] PT Assessment - 08/31/21 0001   ? ?  ? Assessment  ? Medical Diagnosis left shoulder pain   ? Referring Provider (PT) Beatrice Lecher   ? Onset Date/Surgical Date 01/01/22   ? Hand Dominance Left   ? Next MD Visit 10/28/21   ? Prior Therapy lumbar and BPPV   ?  ? Palpation  ? Palpation comment biceps, anterior deltoid, pecs,  subscap and IS tendons   ? ?  ?  ? ?  ? ? ? ? ? ? ? ? ? ? ? ? ? ? ? ? [facility] Adult PT Treatment/Exercise - 08/31/21 0001   ? ?  ? Self-Care  ? ADL's discussed positions for sleeping and for sitting encouraging neutral spine with shoulders down and back and UE supported when sleeping on Rt side; supported upper body when in the recliner   ?  ? Shoulder Exercises: Standing  ? Other Standing Exercises scap squeeze with noodle along spine 10 sec hold x 10 reps   ?  ? Shoulder Exercises: Stretch  ? Table Stretch -Flexion Limitations biceps stretch at table 20 sec hold x 3 reps   ? Other Shoulder Stretches doorway mid position 15-20 sec hold x 3 reps   ? Other Shoulder Stretches thoracic extension sitting in chair with noodle along spine ~ 1-2 min focus on posture and alignment   ?  ? Moist Heat Therapy  ? Number Minutes Moist Heat  10 Minutes   ? Moist Heat Location Shoulder   ?  ? Manual Therapy  ? Manual therapy comments skilled palpation to assess resopnse to Dn and manual work   ? Soft tissue mobilization deep tissue work [employer], anterior deltoid, biceps   ? Myofascial Release anterior Lt chest   ? ?  ?  ? ?  ? ? ? Trigger Point Dry Needling - 08/31/21 0001   ? ? Consent Given? Yes   ? Education Handout Provided Yes   ? Pectoralis Major Response Palpable increased muscle length;Twitch response elicited   ? Pectoralis Minor Response Palpable increased muscle length;Twitch response elicited   ? Deltoid Response Palpable increased muscle length;Twitch response elicited   ? Biceps Response Palpable increased muscle length;Twitch response elicited   ? ?  ?  ? ?  ? ? ? ? ? ? ? ? PT Education - 08/31/21 0923   ? ? Education Details DN HEP   ? Person(s) Educated Patient   ? Methods Explanation;Demonstration;Tactile cues;Verbal cues;Handout   ? Comprehension Verbalized understanding;Verbal cues required;Returned demonstration;Tactile cues required   ? ?  ?  ? ?  ? ? ? ? ? ? PT Long Term Goals - 08/26/21 1947   ? ?  ? PT LONG  TERM GOAL #1  ? Title Pt will be independent with HEP   ? Baseline -   ? Time 6   ? Period Weeks   ? Status New   ? Target Date 10/07/21   ?  ? PT LONG TERM GOAL #2  ? Title Improved left shoulder flex and ABD to [facility] to perform ADLS   ? Time 6   ? Period Weeks   ? Status New   ? Target Date 10/07/21   ?  ? PT LONG TERM GOAL #3  ? Title Improved left shoulder ABD strength to 4+/5 or better   ? Baseline -   ? Time 6   ? Period Weeks   ? Status New   ? Target Date 10/07/21   ?  ? PT LONG TERM GOAL #4  ? Title Deceased pain in the left shoulder with ADLS by >= [id number]   ? Time 6   ? Period Weeks   ? Status New   ? Target Date 10/07/21   ?  ? PT LONG TERM GOAL #5  ? Title Improved FOTO to 60 from 44 demonstrating improved function   ? Baseline -   ? Time 6   ? Period Weeks   ? Status New   ? Target Date 10/07/21   ? ?  ?  ? ?  ? ? ? ? ? ? ? ? Plan - 08/31/21 0849   ? ? Clinical Impression Statement Patient reports cpntinued chest pain which she has been experiencing for several weeks. Awaiting call from MD office and will follow up with MD today to determine next course of treatment. Patient now wondering if the cest paincould be from tightness in the pec muscules. Trial of Dn and manual work tolerated well. Added gentle stretching for pecs and biceps. Note significant tightness through the Lt > Rt pecs, biceps, anterior deltoid, upper traps.   ? Clinical Decision Making Low   ? Rehab Potential Excellent   ? PT Frequency 2x / week   ? PT Duration 6 weeks   ? PT Treatment/Interventions ADLs/Self Care Home Management;Cryotherapy;Electrical Stimulation;Iontophoresis 4mg /ml Dexamethasone;Moist Heat;Neuromuscular re-education;Therapeutic exercise;Therapeutic activities;Patient/family education;Manual techniques;Dry needling;Taping   ? PT Next Visit  Plan Review HEP; assess response to DN to pectoralis, anterior shoulder, subscapularis, lats; ROM and post strengthening; D/C from BPPV episode if no increased sx by 09/01/21.   ? PT  Home Exercise Plan [email or web address]   ? ?  ?  ? ?  ? ? ?Patient will benefit from skilled therapeutic intervention in order to improve the following deficits and impairments:    ? ?Visit Diagnosis: ?Chronic left shoulder pain ? ?Stiffness of left shoulder, not elsewhere classified ? ?Muscle weakness (generalized) ? ? ? ? ?Problem List ?Patient Active Problem List  ? Diagnosis Date Noted  ? History of herpes genitalis 05/11/2021  ? Vaginal atrophy 05/11/2021  ? Vaginal irritation 05/11/2021  ? Lumbar spinal stenosis with right-sided radiculitis 01/12/2021  ? Osteopenia 08/27/2020  ? Allergy to honey bee venom 08/04/2020  ? Memory impairment 12/06/2019  ? Trigger finger 08/07/2019  ? Primary insomnia 03/23/2018  ? Long-term use of high-risk medication 02/06/2018  ? Calcific Achilles tendinitis of left lower extremity 10/06/2017  ? Haglund's deformity of left heel 10/06/2017  ? Essential hypertension 02/01/2017  ? Rheumatoid arthritis involving multiple sites ([place]) 02/01/2017  ? Diabetes mellitus without complication ([place]) [id number]  ? History of colon polyps 02/01/2017  ? Depression, major, single episode, complete remission ([place]) 02/01/2017  ? Hyperlipidemia 02/01/2017  ? ? ?Everardo All, PT, MPH  ?08/31/2021, 11:17 AM ? ? ?[facility] ?[place] ?[place], [place], [address] ?Phone: [phone]   Fax:  [phone] ? ?Name: Ann Torres ?MRN: [id number] ?Date of Birth: 1956/03/30 ? ? ? ?

## 2021-09-08 DIAGNOSIS — I209 Angina pectoris, unspecified: Secondary | ICD-10-CM | POA: Diagnosis not present

## 2021-09-08 DIAGNOSIS — R9439 Abnormal result of other cardiovascular function study: Secondary | ICD-10-CM | POA: Diagnosis not present

## 2021-09-08 DIAGNOSIS — R079 Chest pain, unspecified: Secondary | ICD-10-CM | POA: Diagnosis not present

## 2021-09-08 DIAGNOSIS — R931 Abnormal findings on diagnostic imaging of heart and coronary circulation: Secondary | ICD-10-CM | POA: Diagnosis not present

## 2021-09-09 ENCOUNTER — Encounter: Payer: Self-pay | Admitting: Rehabilitative and Restorative Service Providers"

## 2021-09-09 ENCOUNTER — Ambulatory Visit: Payer: Medicare Other | Admitting: Rehabilitative and Restorative Service Providers"

## 2021-09-09 DIAGNOSIS — R252 Cramp and spasm: Secondary | ICD-10-CM | POA: Diagnosis not present

## 2021-09-09 DIAGNOSIS — G8929 Other chronic pain: Secondary | ICD-10-CM

## 2021-09-09 DIAGNOSIS — M25612 Stiffness of left shoulder, not elsewhere classified: Secondary | ICD-10-CM | POA: Diagnosis not present

## 2021-09-09 DIAGNOSIS — M6281 Muscle weakness (generalized): Secondary | ICD-10-CM | POA: Diagnosis not present

## 2021-09-09 DIAGNOSIS — M25512 Pain in left shoulder: Secondary | ICD-10-CM | POA: Diagnosis not present

## 2021-09-09 NOTE — Therapy (Signed)
[facility] ?[facility] ?[place] ?[place], [place], [address] ?Phone: [phone]   Fax:  [phone] ? ?Physical Therapy Treatment ? ?Patient Details  ?Name: Ann Torres ?MRN: [id number] ?Date of Birth: 1955/07/29 ?Referring Provider (PT): Beatrice Lecher ? ? ?Encounter Date: 09/09/2021 ? ? PT End of Session - 09/09/21 0847   ? ? Visit Number 3   ? Number of Visits 12   ? Date for PT Re-Evaluation 10/07/21   ? Authorization Type UHC Medicare   ? Authorization - Visit Number 3   ? Progress Note Due on Visit 10   ? PT Start Time 0845   ? PT Stop Time [id number]   ? PT Time Calculation (min) 48 min   ? Activity Tolerance Patient tolerated treatment well   ? ?  ?  ? ?  ? ? ?History reviewed. No pertinent past medical history. ? ?Past Surgical History:  ?Procedure Laterality Date  ? BACK SURGERY    ? BILATERAL OOPHORECTOMY    ? BREAST BIOPSY    ? in pts 18s  ? BREAST CYST EXCISION    ? Pt unsure which breast & unable to find a scar  ? CESAREAN SECTION    ? x 3  ? CHOLECYSTECTOMY    ? TOTAL ABDOMINAL HYSTERECTOMY    ? TUBAL LIGATION    ? ? ?There were no vitals filed for this visit. ? ? Subjective Assessment - 09/09/21 0848   ? ? Subjective Shoulder pain is decreased. Better with the DN and last treatment. Can lift her arm higher without pain. Sleeping without awakening during the night due to pain. Saw cardioloist this week and he felt the shoudldr and pec tightness could contribute to the chest pain. She will have further testing for heart   ? Currently in Pain? Yes   ? Pain Score 5    ? Pain Location Shoulder   ? Pain Orientation Left;Anterior   ? Pain Descriptors / Indicators Aching;Dull;Sharp   ? Pain Type Chronic pain   ? ?  ?  ? ?  ? ? ? ? ? ? ? ? ? ? ? ? ? ? ? ? ? ? ? ? [facility] Adult PT Treatment/Exercise - 09/09/21 0001   ? ?  ? Shoulder Exercises: Standing  ? Other Standing Exercises chin tuck 10 sec x 5; scap squeeze with noodle along spine 10 sec hold x 10 reps;  L's x 15; W's x 15   ?  ? Shoulder Exercises: Stretch  ? Table Stretch -Flexion Limitations biceps stretch at table 20 sec hold x 3 reps   ? Other Shoulder Stretches doorway three positions 15-20 sec hold x 3 reps each postion   ?  ? Moist Heat Therapy  ? Number Minutes Moist Heat 10 Minutes   ? Moist Heat Location Shoulder   ?  ? Manual Therapy  ? Manual therapy comments skilled palpation to assess resopnse to Dn and manual work   ? Soft tissue mobilization deep tissue work [employer], anterior deltoid, biceps   ? Myofascial Release anterior Lt chest   ? ?  ?  ? ?  ? ? ? Trigger Point Dry Needling - 09/09/21 0001   ? ? Consent Given? Yes   ? Education Handout Provided Previously provided   ? Pectoralis Major Response Palpable increased muscle length;Twitch response elicited   ? Pectoralis Minor Response Palpable increased muscle length;Twitch response elicited   ? Deltoid Response Palpable increased muscle  length;Twitch response elicited   ? Biceps Response Palpable increased muscle length;Twitch response elicited   ? ?  ?  ? ?  ? ? ? ? ? ? ? ? PT Education - 09/09/21 0903   ? ? Education Details HEP   ? Person(s) Educated Patient   ? Methods Explanation;Demonstration;Tactile cues;Verbal cues;Handout   ? Comprehension Verbalized understanding;Returned demonstration;Verbal cues required;Tactile cues required   ? ?  ?  ? ?  ? ? ? ? ? ? PT Long Term Goals - 08/26/21 1947   ? ?  ? PT LONG TERM GOAL #1  ? Title Pt will be independent with HEP   ? Baseline -   ? Time 6   ? Period Weeks   ? Status New   ? Target Date 10/07/21   ?  ? PT LONG TERM GOAL #2  ? Title Improved left shoulder flex and ABD to [facility] to perform ADLS   ? Time 6   ? Period Weeks   ? Status New   ? Target Date 10/07/21   ?  ? PT LONG TERM GOAL #3  ? Title Improved left shoulder ABD strength to 4+/5 or better   ? Baseline -   ? Time 6   ? Period Weeks   ? Status New   ? Target Date 10/07/21   ?  ? PT LONG TERM GOAL #4  ? Title Deceased pain in the left  shoulder with ADLS by >= [id number]   ? Time 6   ? Period Weeks   ? Status New   ? Target Date 10/07/21   ?  ? PT LONG TERM GOAL #5  ? Title Improved FOTO to 60 from 44 demonstrating improved function   ? Baseline -   ? Time 6   ? Period Weeks   ? Status New   ? Target Date 10/07/21   ? ?  ?  ? ?  ? ? ? ? ? ? ? ? Plan - 09/09/21 0849   ? ? Clinical Impression Statement Good response to last treatment. Increased AROM Lt shoulder with less pain. Continued with DN and manual work.   ? Rehab Potential Excellent   ? PT Frequency 2x / week   ? PT Duration 6 weeks   ? PT Treatment/Interventions ADLs/Self Care Home Management;Cryotherapy;Electrical Stimulation;Iontophoresis 4mg /ml Dexamethasone;Moist Heat;Neuromuscular re-education;Therapeutic exercise;Therapeutic activities;Patient/family education;Manual techniques;Dry needling;Taping   ? PT Next Visit Plan Review HEP; continue  DN to pectoralis, anterior shoulder, subscapularis, lats; ROM and post strengthening; D/C from BPPV episode if no increased sx by 09/01/21.   ? PT Home Exercise Plan [email or web address]   ? Consulted and Agree with Plan of Care Patient   ? ?  ?  ? ?  ? ? ?Patient will benefit from skilled therapeutic intervention in order to improve the following deficits and impairments:    ? ?Visit Diagnosis: ?Chronic left shoulder pain ? ?Stiffness of left shoulder, not elsewhere classified ? ?Muscle weakness (generalized) ? ?Cramp and spasm ? ? ? ? ?Problem List ?Patient Active Problem List  ? Diagnosis Date Noted  ? History of herpes genitalis 05/11/2021  ? Vaginal atrophy 05/11/2021  ? Vaginal irritation 05/11/2021  ? Lumbar spinal stenosis with right-sided radiculitis 01/12/2021  ? Osteopenia 08/27/2020  ? Allergy to honey bee venom 08/04/2020  ? Memory impairment 12/06/2019  ? Trigger finger 08/07/2019  ? Primary insomnia 03/23/2018  ? Long-term use of high-risk medication 02/06/2018  ? Calcific Achilles tendinitis of left lower  extremity 10/06/2017  ? Haglund's deformity of  left heel 10/06/2017  ? Essential hypertension 02/01/2017  ? Rheumatoid arthritis involving multiple sites ([place]) 02/01/2017  ? Diabetes mellitus without complication ([place]) [id number]  ? History of colon polyps 02/01/2017  ? Depression, major, single episode, complete remission ([place]) 02/01/2017  ? Hyperlipidemia 02/01/2017  ? ? ?Everardo All, PT, MPH  ?09/09/2021, 9:19 AM ? ?[place] ?[facility] ?[place] ?[place], [place], [address] ?Phone: [phone]   Fax:  [phone] ? ?Name: JAZARAH EDQUIST ?MRN: [id number] ?Date of Birth: Jan 19, 1956 ? ? ? ?

## 2021-09-09 NOTE — Patient Instructions (Signed)
Access Code: [email or web address] ?URL: [email or web address] ?Date: 09/09/2021 ?Prepared by: Corlis Leak ? ?Exercises ?- Shoulder Flexion Wall Slide with Towel  - 1 x daily - 7 x weekly - 3 sets - 10 reps ?- Standing Shoulder Abduction AAROM with [place]  - 2 x daily - 7 x weekly - 3 sets - 10 reps - 3-5 sec hold ?- Seated Scapular Retraction  - 1 x daily - 7 x weekly - 1-3 sets - 10 reps - 2-3 sec hold ?- Seated Scapular Retraction  - 2 x daily - 7 x weekly - 1-2 sets - 10 reps - 10 sec  hold ?- Bicep Stretch at Table  - 2 x daily - 7 x weekly - 1 sets - 3 reps - 30 sec  hold ?- Supine Thoracic Mobilization Towel Roll Vertical with Arm Stretch  - 2 x daily - 7 x weekly - 1 sets - 1-3 reps - 2-3 min hold ?- Doorway Pec Stretch at 60 Degrees Abduction  - 3 x daily - 7 x weekly - 1 sets - 3 reps ?- Doorway Pec Stretch at 90 Degrees Abduction  - 3 x daily - 7 x weekly - 1 sets - 3 reps - 30 seconds  hold ?- Doorway Pec Stretch at 120 Degrees Abduction  - 3 x daily - 7 x weekly - 1 sets - 3 reps - 30 second hold  hold ?- Seated Scapular Retraction  - 23- x daily - 7 x weekly - 1-3 sets - 10 reps - 3-5 sec  hold ?

## 2021-09-18 DIAGNOSIS — E119 Type 2 diabetes mellitus without complications: Secondary | ICD-10-CM | POA: Diagnosis not present

## 2021-09-21 DIAGNOSIS — I2510 Atherosclerotic heart disease of native coronary artery without angina pectoris: Secondary | ICD-10-CM | POA: Diagnosis not present

## 2021-09-21 DIAGNOSIS — R9439 Abnormal result of other cardiovascular function study: Secondary | ICD-10-CM | POA: Diagnosis not present

## 2021-09-21 DIAGNOSIS — R079 Chest pain, unspecified: Secondary | ICD-10-CM | POA: Diagnosis not present

## 2021-09-21 DIAGNOSIS — I209 Angina pectoris, unspecified: Secondary | ICD-10-CM | POA: Diagnosis not present

## 2021-09-23 ENCOUNTER — Encounter: Payer: Self-pay | Admitting: Rehabilitative and Restorative Service Providers"

## 2021-09-23 ENCOUNTER — Ambulatory Visit: Payer: Medicare Other | Admitting: Rehabilitative and Restorative Service Providers"

## 2021-09-23 DIAGNOSIS — R252 Cramp and spasm: Secondary | ICD-10-CM | POA: Diagnosis not present

## 2021-09-23 DIAGNOSIS — G8929 Other chronic pain: Secondary | ICD-10-CM | POA: Diagnosis not present

## 2021-09-23 DIAGNOSIS — M6281 Muscle weakness (generalized): Secondary | ICD-10-CM

## 2021-09-23 DIAGNOSIS — M25612 Stiffness of left shoulder, not elsewhere classified: Secondary | ICD-10-CM | POA: Diagnosis not present

## 2021-09-23 DIAGNOSIS — M25512 Pain in left shoulder: Secondary | ICD-10-CM | POA: Diagnosis not present

## 2021-09-23 NOTE — Therapy (Signed)
[facility] [address] [place], [place], [address] Phone: [phone]   Fax:  [phone]  Physical Therapy Treatment Rationale for Evaluation and Treatment Rehabilitation  Patient Details  Name: Ann Torres MRN: [id number] Date of Birth: [birth year] Referring Provider (PT): Beatrice Lecher   Encounter Date: 09/23/2021   PT End of Session - 09/23/21 1019     Visit Number 4    Number of Visits 12    Date for PT Re-Evaluation 10/07/21    Authorization Type UHC Medicare    Progress Note Due on Visit 10    PT Start Time 1017    PT Stop Time 1105    PT Time Calculation (min) 48 min    Activity Tolerance Patient tolerated treatment well             History reviewed. No pertinent past medical history.  Past Surgical History:  Procedure Laterality Date   BACK SURGERY     BILATERAL OOPHORECTOMY     BREAST BIOPSY     in pts 30s   BREAST CYST EXCISION     Pt unsure which breast & unable to find a scar   CESAREAN SECTION     x 3   CHOLECYSTECTOMY     TOTAL ABDOMINAL HYSTERECTOMY     TUBAL LIGATION      There were no vitals filed for this visit.   Subjective Assessment - 09/23/21 1019     Subjective Patient reports that she has additional testing for heart and has been diagnosed with CAD. She has an appointment with the cardiologist in June and they will decide on course of treatment but she is in no immediate danger. Her Lt shoulder is some better. She can lift her arm higher. She is having some pain at night intermittently.    Currently in Pain? Yes    Pain Score 4     Pain Location Shoulder    Pain Orientation Left;Anterior    Pain Descriptors / Indicators Aching;Dull;Sharp    Pain Type Chronic pain    Pain Onset More than a month ago    Pain Frequency Intermittent    Aggravating Factors  overhead activities; worse in the evening                [facility] PT Assessment - 09/23/21 0001        Assessment   Medical Diagnosis left shoulder pain    Referring Provider (PT) Beatrice Lecher    Onset Date/Surgical Date 01/01/22    Hand Dominance Left    Next MD Visit 10/28/21    Prior Therapy lumbar and BPPV      AROM   AROM Assessment Site Shoulder    Right/Left Shoulder Left    Left Shoulder Extension 62 Degrees    Left Shoulder Flexion 132 Degrees    Left Shoulder ABduction 125 Degrees    Left Shoulder Internal Rotation 85 Degrees   shoulder 90 deg flex/abd; elbow 90 deg   Left Shoulder External Rotation --   thumb to T8/9     Strength   Overall Strength Comments mild pain with abduction    Strength Assessment Site Shoulder    Left Shoulder Flexion --   5-/5   Left Shoulder Extension 5/5    Left Shoulder ABduction 4/5    Left Shoulder Internal Rotation --   5-/5   Left Shoulder External Rotation 5/5      Palpation   Palpation comment biceps, anterior  deltoid, pecs, subscap and IS tendons                           [facility] Adult PT Treatment/Exercise - 09/23/21 0001       Shoulder Exercises: Standing   Row Strengthening;Both;10 reps;Theraband    Theraband Level (Shoulder Row) Level 3 (Green)    Row Limitations bow and arrow greeen TB x 10 each side    Retraction Strengthening;Both;10 reps;Theraband    Theraband Level (Shoulder Retraction) Level 1 (Yellow)    Retraction Limitations VC for chest lift    Other Standing Exercises chin tuck 10 sec x 5; scap squeeze with noodle along spine 10 sec hold x 10 reps; L's x 15; W's x 15      Shoulder Exercises: Stretch   Table Stretch -Flexion Limitations biceps stretch at table 20 sec hold x 3 reps    Other Shoulder Stretches doorway three positions 15-20 sec hold x 3 reps each postion      Manual Therapy   Manual therapy comments skilled palpation to assess resopnse to Dn and manual work    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 Yes    Education Handout Provided Previously provided    Pectoralis Major Response Palpable increased muscle length;Twitch response elicited    Pectoralis Minor Response Palpable increased muscle length;Twitch response elicited    Deltoid Response Palpable increased muscle length;Twitch response elicited    Biceps Response Palpable increased muscle length;Twitch response elicited                   PT Education - 09/23/21 1059     Education Details HEP    Person(s) Educated Patient    Methods Explanation;Demonstration;Tactile cues;Verbal cues;Handout    Comprehension Verbalized understanding;Returned demonstration;Verbal cues required;Tactile cues required                 PT Long Term Goals - 08/26/21 1947       PT LONG TERM GOAL #1   Title Pt will be independent with HEP    Baseline -    Time 6    Period Weeks    Status New    Target Date 10/07/21      PT LONG TERM GOAL #2   Title Improved left shoulder flex and ABD to [facility] to perform ADLS    Time 6    Period Weeks    Status New    Target Date 10/07/21      PT LONG TERM GOAL #3   Title Improved left shoulder ABD strength to 4+/5 or better    Baseline -    Time 6    Period Weeks    Status New    Target Date 10/07/21      PT LONG TERM GOAL #4   Title Deceased pain in the left shoulder with ADLS by >= [id number]    Time 6    Period Weeks    Status New    Target Date 10/07/21      PT LONG TERM GOAL #5   Title Improved FOTO to 60 from 49 demonstrating  improved function    Baseline -    Time 6    Period Weeks    Status New    Target Date 10/07/21                   Plan - 09/23/21 1031     Clinical Impression Statement Good response to DN and manual work. She demonstrates some decrease in AROM Lt  shoulder but increase in strength Lt shoulder. Continued work on posture and alignment; posterior shoulder girdle strengthening; DN/manual work Lt anterior shoulder.    Rehab Potential Excellent    PT Frequency 2x / week    PT Duration 6 weeks    PT Treatment/Interventions ADLs/Self Care Home Management;Cryotherapy;Electrical Stimulation;Iontophoresis 4mg /ml Dexamethasone;Moist Heat;Neuromuscular re-education;Therapeutic exercise;Therapeutic activities;Patient/family education;Manual techniques;Dry needling;Taping    PT Next Visit Plan Review HEP; continue  DN to pectoralis, anterior shoulder, subscapularis, lats; ROM and shoulder strengthening    PT Home Exercise Plan [email or web address]    Consulted and Agree with Plan of Care Patient             Patient will benefit from skilled therapeutic intervention in order to improve the following deficits and impairments:     Visit Diagnosis: Chronic left shoulder pain  Stiffness of left shoulder, not elsewhere classified  Muscle weakness (generalized)  Cramp and spasm     Problem List Patient Active Problem List   Diagnosis Date Noted   History of herpes genitalis 05/11/2021   Vaginal atrophy 05/11/2021   Vaginal irritation 05/11/2021   Lumbar spinal stenosis with right-sided radiculitis 01/12/2021   Osteopenia 08/27/2020   Allergy to honey bee venom 08/04/2020   Memory impairment 12/06/2019   Trigger finger 08/07/2019   Primary insomnia 03/23/2018   Long-term use of high-risk medication 02/06/2018   Calcific Achilles tendinitis of left lower extremity 10/06/2017   Haglund's deformity of left heel 10/06/2017   Essential hypertension 02/01/2017   Rheumatoid arthritis involving multiple sites ([place]) 02/01/2017   Diabetes mellitus without complication ([place]) [id number]   History of colon polyps 02/01/2017   Depression, major, single episode, complete remission ([place]) 02/01/2017   Hyperlipidemia 02/01/2017    Meera Vasco Nilda Simmer, PT, MPH   09/23/2021, 11:08 AM  [facility] [place], [place], [address] Phone: [phone]   Fax:  [phone]  Name: Ann Torres MRN: [id number] Date of Birth: February 05, 1956

## 2021-09-23 NOTE — Patient Instructions (Signed)
Access Code: [email or web address] URL: [email or web address] Date: 09/23/2021 Prepared by: Corlis Leak  Exercises - Supine Thoracic Mobilization Towel Roll Vertical with Arm Stretch  - 2 x daily - 7 x weekly - 1 sets - 1-3 reps - 2-3 min hold - Doorway Pec Stretch at 60 Degrees Abduction  - 3 x daily - 7 x weekly - 1 sets - 3 reps - Doorway Pec Stretch at 90 Degrees Abduction  - 3 x daily - 7 x weekly - 1 sets - 3 reps - 30 seconds  hold - Doorway Pec Stretch at 120 Degrees Abduction  - 3 x daily - 7 x weekly - 1 sets - 3 reps - 30 second hold  hold - Seated Scapular Retraction  - 23- x daily - 7 x weekly - 1-3 sets - 10 reps - 3-5 sec  hold - Bicep Stretch at Table  - 2 x daily - 7 x weekly - 1 sets - 3 reps - 30 sec  hold - Shoulder External Rotation and Scapular Retraction with Resistance  - 1 x daily - 7 x weekly - 3 sets - 10 reps - Scapular Retraction with Resistance  - 1 x daily - 7 x weekly - 3 sets - 10 reps - 3 hold - Drawing Bow  - 1 x daily - 7 x weekly - 1 sets - 10 reps - 3 sec  hold

## 2021-09-29 ENCOUNTER — Encounter: Payer: Self-pay | Admitting: Family Medicine

## 2021-10-06 ENCOUNTER — Ambulatory Visit: Payer: Medicare Other | Attending: Family Medicine | Admitting: Physical Therapy

## 2021-10-06 DIAGNOSIS — M25612 Stiffness of left shoulder, not elsewhere classified: Secondary | ICD-10-CM | POA: Insufficient documentation

## 2021-10-06 DIAGNOSIS — M6281 Muscle weakness (generalized): Secondary | ICD-10-CM | POA: Insufficient documentation

## 2021-10-06 DIAGNOSIS — M25512 Pain in left shoulder: Secondary | ICD-10-CM | POA: Insufficient documentation

## 2021-10-06 DIAGNOSIS — R252 Cramp and spasm: Secondary | ICD-10-CM | POA: Diagnosis not present

## 2021-10-06 DIAGNOSIS — G8929 Other chronic pain: Secondary | ICD-10-CM | POA: Insufficient documentation

## 2021-10-06 NOTE — Therapy (Signed)
[place], [place], [address] Phone: [phone]   Fax:  [phone]  Physical Therapy Treatment and Re-Cert  Patient Details  Name: Ann Torres MRN: [id number] Date of Birth: 17-Jun-1955 Referring Provider (PT): Beatrice Lecher   Encounter Date: 10/06/2021   PT End of Session - 10/06/21 0801     Visit Number 5    Number of Visits 12    Date for PT Re-Evaluation 11/17/21    Authorization Type UHC Medicare    Authorization - Visit Number 5    Progress Note Due on Visit 10    PT Start Time 0801    PT Stop Time 0845    PT Time Calculation (min) 44 min    Activity Tolerance Patient tolerated treatment well    Behavior During Therapy [facility] for tasks assessed/performed             No past medical history on file.  Past Surgical History:  Procedure Laterality Date   BACK SURGERY     BILATERAL OOPHORECTOMY     BREAST BIOPSY     in pts 30s   BREAST CYST EXCISION     Pt unsure which breast & unable to find a scar   CESAREAN SECTION     x 3   CHOLECYSTECTOMY     TOTAL ABDOMINAL HYSTERECTOMY     TUBAL LIGATION      There were no vitals filed for this visit.   Subjective Assessment - 10/06/21 0803     Subjective Pt continues to do her exercises daily. Pt reports she feels that if TPDN is done she can do her exercises.    Currently in Pain? Yes    Pain Score 4     Pain Location Shoulder    Pain Onset More than a month ago                [facility] PT Assessment - 10/06/21 0001       Assessment   Medical Diagnosis left shoulder pain    Referring Provider (PT) Beatrice Lecher    Onset Date/Surgical Date 01/01/22    Hand Dominance Left      AROM   Left Shoulder Extension 74 Degrees    Left Shoulder Flexion 140 Degrees    Left Shoulder ABduction 105 Degrees   to point of pain   Left Shoulder Internal Rotation 65 Degrees    Left Shoulder External Rotation 60 Degrees       Strength   Left Shoulder Flexion 4+/5    Left Shoulder Extension 5/5    Left Shoulder ABduction 4/5    Left Shoulder Internal Rotation 5/5    Left Shoulder External Rotation 5/5                           [facility] Adult PT Treatment/Exercise - 10/06/21 0001       Shoulder Exercises: Sidelying   External Rotation Strengthening;Left;10 reps;Theraband    Theraband Level (Shoulder External Rotation) Level 1 (Yellow)    External Rotation Limitations reactive isometrics in 60 deg abduction    Internal Rotation Strengthening;Left;10 reps;Theraband    Theraband Level (Shoulder Internal Rotation) Level 1 (Yellow)    Internal Rotation Limitations 60 deg abduction    ABduction Strengthening;Left;20 reps    ABduction Limitations scaption 2x10      Shoulder Exercises: Standing   External Rotation Strengthening;Both;20 reps;Theraband    Theraband Level (Shoulder  External Rotation) Level 1 (Yellow)    Other Standing Exercises chin tuck 10 sec x 5; scap squeeze with noodle along spine L's 2x 10; W's 2x 10; horizontal abduction 2x10;  yellow tband                    Prior goals:   PT Long Term Goals - 10/06/21 0810       PT LONG TERM GOAL #1   Title Pt will be independent with HEP    Baseline -    Time 6    Period Weeks    Status Achieved    Target Date 10/07/21      PT LONG TERM GOAL #2   Title Improved left shoulder flex and ABD to [facility] to perform ADLS    Baseline flex >100 deg; however, abd ~90 deg before pain 10/06/21    Time 6    Period Weeks    Status Partially Met    Target Date 10/07/21      PT LONG TERM GOAL #3   Title Improved left shoulder ABD strength to 4+/5 or better    Baseline abd 4/5 on 10/06/21 within available range    Time 6    Period Weeks    Status New    Target Date 10/07/21      PT LONG TERM GOAL #4   Title Deceased pain in the left shoulder with ADLS by >= 72%    Baseline 60-70% reports    Time 6    Period Weeks    Status  Partially Met    Target Date 10/07/21      PT LONG TERM GOAL #5   Title Improved FOTO to 60 from 44 demonstrating improved function    Baseline -    Time 6    Period Weeks    Status Achieved    Target Date 10/07/21               Revised goals:  PT Long Term Goals - 10/06/21 0810       PT LONG TERM GOAL #1   Title Pt will be independent with HEP    Baseline -    Time 6    Period Weeks    Status Revised    Target Date 11/17/21      PT LONG TERM GOAL #2   Title Improved left shoulder flex and ABD to [facility] to perform ADLS    Baseline flex >100 deg; however, abd ~90 deg before pain 10/06/21    Time 6    Period Weeks    Status Revised    Target Date 11/17/21      PT LONG TERM GOAL #3   Title Improved left shoulder ABD strength to 4+/5 or better    Baseline abd 4/5 on 10/06/21 within available range    Time 6    Period Weeks    Status Revised    Target Date 11/17/21      PT LONG TERM GOAL #4   Title Deceased pain in the left shoulder with ADLS by >= 09%    Baseline 60-70% reports    Time 6    Period Weeks    Status Revised    Target Date 11/17/21      PT LONG TERM GOAL #5   Title Improved FOTO to 60 from 44 demonstrating improved function    Baseline -    Time 6    Period Weeks  Status Achieved    Target Date 10/07/21                   Plan - 10/06/21 0843     Clinical Impression Statement Treatment session focused on progressing pt's shoulder strengthening exercises. Re-Checked pt's LTGs -- she will still benefit from continued PT to meet her LTGs. She has met her FOTO goal but not yet her strength or ROM goals. Posterior shoulder girdle remains weak with tight pecs.    Personal Factors and Comorbidities Time since onset of injury/illness/exacerbation;Comorbidity 2    Comorbidities DM, RA    Examination-Activity Limitations Bathing;Carry;Hygiene/Grooming;Dressing;Lift;Reach Overhead;Sleep    Examination-Participation Restrictions Cleaning;Meal  Prep    Rehab Potential Excellent    PT Frequency 2x / week    PT Duration 6 weeks    PT Treatment/Interventions ADLs/Self Care Home Management;Cryotherapy;Electrical Stimulation;Iontophoresis 62m/ml Dexamethasone;Moist Heat;Neuromuscular re-education;Therapeutic exercise;Therapeutic activities;Patient/family education;Manual techniques;Dry needling;Taping    PT Next Visit Plan Review HEP; continue  DN to pectoralis, anterior shoulder, subscapularis, lats; ROM and shoulder strengthening    PT Home Exercise Plan [email or web address]    Consulted and Agree with Plan of Care Patient             Patient will benefit from skilled therapeutic intervention in order to improve the following deficits and impairments:  Decreased range of motion, Impaired UE functional use, Increased muscle spasms, Pain, Impaired flexibility, Decreased strength, Impaired sensation  Visit Diagnosis: Chronic left shoulder pain  Stiffness of left shoulder, not elsewhere classified  Muscle weakness (generalized)  Cramp and spasm     Problem List Patient Active Problem List   Diagnosis Date Noted   History of herpes genitalis 05/11/2021   Vaginal atrophy 05/11/2021   Vaginal irritation 05/11/2021   Lumbar spinal stenosis with right-sided radiculitis 01/12/2021   Osteopenia 08/27/2020   Allergy to honey bee venom 08/04/2020   Memory impairment 12/06/2019   Trigger finger 08/07/2019   Primary insomnia 03/23/2018   Long-term use of high-risk medication 02/06/2018   Calcific Achilles tendinitis of left lower extremity 10/06/2017   Haglund's deformity of left heel 10/06/2017   Essential hypertension 02/01/2017   Rheumatoid arthritis involving multiple sites ([place] 02/01/2017   Diabetes mellitus without complication ([place] 124/01/7352  History of colon polyps 02/01/2017   Depression, major, single episode, complete remission ([facility] 02/01/2017   Hyperlipidemia 02/01/2017    Viraaj Vorndran April MGordy Levan PT, DPT 10/06/2021,  8:46 AM  [facility] [address]Phone: [phone]  Fax:  [phone] Name: LNITHILA SUMNERSMRN: [id number] of Birth: 6Nov 21, 1957

## 2021-10-08 ENCOUNTER — Ambulatory Visit: Payer: Medicare Other | Admitting: Physical Therapy

## 2021-10-12 ENCOUNTER — Ambulatory Visit: Payer: Medicare Other | Admitting: Physical Therapy

## 2021-10-12 DIAGNOSIS — M6281 Muscle weakness (generalized): Secondary | ICD-10-CM | POA: Diagnosis not present

## 2021-10-12 DIAGNOSIS — M25512 Pain in left shoulder: Secondary | ICD-10-CM | POA: Diagnosis not present

## 2021-10-12 DIAGNOSIS — G8929 Other chronic pain: Secondary | ICD-10-CM | POA: Diagnosis not present

## 2021-10-12 DIAGNOSIS — R252 Cramp and spasm: Secondary | ICD-10-CM | POA: Diagnosis not present

## 2021-10-12 DIAGNOSIS — M25612 Stiffness of left shoulder, not elsewhere classified: Secondary | ICD-10-CM

## 2021-10-12 NOTE — Therapy (Signed)
[place], [place], [address] Phone: [phone]   Fax:  [phone]  Physical Therapy Treatment  Patient Details  Name: Ann Torres MRN: [id number] Date of Birth: [birth year] Referring Provider (PT): Beatrice Lecher   Encounter Date: 10/12/2021   PT End of Session - 10/12/21 0809     Visit Number 6    Number of Visits 12    Date for PT Re-Evaluation 11/17/21    Authorization Type UHC Medicare    Authorization - Visit Number 6    Progress Note Due on Visit 10    PT Start Time 0805    PT Stop Time 0845    PT Time Calculation (min) 40 min    Activity Tolerance Patient tolerated treatment well    Behavior During Therapy [facility] for tasks assessed/performed             No past medical history on file.  Past Surgical History:  Procedure Laterality Date   BACK SURGERY     BILATERAL OOPHORECTOMY     BREAST BIOPSY     in pts 30s   BREAST CYST EXCISION     Pt unsure which breast & unable to find a scar   CESAREAN SECTION     x 3   CHOLECYSTECTOMY     TOTAL ABDOMINAL HYSTERECTOMY     TUBAL LIGATION      There were no vitals filed for this visit.   Subjective Assessment - 10/12/21 0810     Subjective Pt reports generalized aches today due to her RA. Shoulder has been feeling fine in general.    Pertinent History post covid heart attack, lumbar laminectomy and foraminectomy 06/05/21, BPPV, DM, RA    Limitations House hold activities    Currently in Pain? Yes    Pain Score 5     Pain Location Shoulder    Pain Orientation Left;Anterior    Pain Descriptors / Indicators Aching    Pain Type Chronic pain    Pain Onset More than a month ago                [facility] PT Assessment - 10/12/21 0001       Assessment   Medical Diagnosis left shoulder pain    Referring Provider (PT) Beatrice Lecher    Onset Date/Surgical Date 01/01/22                            [facility] Adult PT Treatment/Exercise - 10/12/21 0001       Shoulder Exercises: Standing   External Rotation Strengthening;Theraband;10 reps;Right;Left    Theraband Level (Shoulder External Rotation) Level 1 (Yellow)    External Rotation Limitations shoulder abducted reactive iso    Internal Rotation Strengthening;Left;10 reps    Theraband Level (Shoulder Internal Rotation) Level 1 (Yellow)    Internal Rotation Limitations shoulder abducted reactive iso    Other Standing Exercises chin tuck 10 sec x 5; scap squeeze with noodle along spine L's 2x 10; W's 2x 8; horizontal abduction 2x10;  red tband      Shoulder Exercises: ROM/Strengthening   Nustep L5 x 5 min      Shoulder Exercises: Stretch   Other Shoulder Stretches doorway three positions 30 sec hold x 3 reps each postion                          PT Long  Term Goals - 10/06/21 0810       PT LONG TERM GOAL #1   Title Pt will be independent with HEP    Baseline -    Time 6    Period Weeks    Status Revised    Target Date 11/17/21      PT LONG TERM GOAL #2   Title Improved left shoulder flex and ABD to [facility] to perform ADLS    Baseline flex >100 deg; however, abd ~90 deg before pain 10/06/21    Time 6    Period Weeks    Status Revised    Target Date 11/17/21      PT LONG TERM GOAL #3   Title Improved left shoulder ABD strength to 4+/5 or better    Baseline abd 4/5 on 10/06/21 within available range    Time 6    Period Weeks    Status Revised    Target Date 11/17/21      PT LONG TERM GOAL #4   Title Deceased pain in the left shoulder with ADLS by >= [id number]    Baseline 60-70% reports    Time 6    Period Weeks    Status Revised    Target Date 11/17/21      PT LONG TERM GOAL #5   Title Improved FOTO to 60 from 12 demonstrating improved function    Baseline -    Time 6    Period Weeks    Status Achieved    Target Date 10/07/21                   Plan - 10/12/21 0820     Clinical Impression  Statement Continued to progress strengthening exercises this session working on improving posterior shoulder girdle strength while maintaining scapular stability during abduction.    Personal Factors and Comorbidities Time since onset of injury/illness/exacerbation;Comorbidity 2    Comorbidities DM, RA    Examination-Activity Limitations Bathing;Carry;Hygiene/Grooming;Dressing;Lift;Reach Overhead;Sleep    Examination-Participation Restrictions Cleaning;Meal Prep    Rehab Potential Excellent    PT Frequency 2x / week    PT Duration 6 weeks    PT Treatment/Interventions ADLs/Self Care Home Management;Cryotherapy;Electrical Stimulation;Iontophoresis 4mg /ml Dexamethasone;Moist Heat;Neuromuscular re-education;Therapeutic exercise;Therapeutic activities;Patient/family education;Manual techniques;Dry needling;Taping    PT Next Visit Plan Review HEP; continue  DN to pectoralis, anterior shoulder, subscapularis, lats; ROM and shoulder strengthening    PT Home Exercise Plan [email or web address]    Consulted and Agree with Plan of Care Patient             Patient will benefit from skilled therapeutic intervention in order to improve the following deficits and impairments:  Decreased range of motion, Impaired UE functional use, Increased muscle spasms, Pain, Impaired flexibility, Decreased strength, Impaired sensation  Visit Diagnosis: Chronic left shoulder pain  Stiffness of left shoulder, not elsewhere classified  Muscle weakness (generalized)  Cramp and spasm     Problem List Patient Active Problem List   Diagnosis Date Noted   History of herpes genitalis 05/11/2021   Vaginal atrophy 05/11/2021   Vaginal irritation 05/11/2021   Lumbar spinal stenosis with right-sided radiculitis 01/12/2021   Osteopenia 08/27/2020   Allergy to honey bee venom 08/04/2020   Memory impairment 12/06/2019   Trigger finger 08/07/2019   Primary insomnia 03/23/2018   Long-term use of high-risk medication 02/06/2018    Calcific Achilles tendinitis of left lower extremity 10/06/2017   Haglund's deformity of left heel 10/06/2017   Essential hypertension 02/01/2017   Rheumatoid  arthritis involving multiple sites [facility]) 02/01/2017   Diabetes mellitus without complication ([place]) [id number]   History of colon polyps 02/01/2017   Depression, major, single episode, complete remission [facility]) 02/01/2017   Hyperlipidemia 02/01/2017    [facility] [address], PT, DPT 10/12/2021, 8:40 AM  [facility] [place], [place], [address] Phone: [phone]   Fax:  [phone]  Name: Ann Torres MRN: [id number] Date of Birth: 1956-03-19

## 2021-10-15 ENCOUNTER — Ambulatory Visit: Payer: Medicare Other | Admitting: Physical Therapy

## 2021-10-15 LAB — HM DIABETES EYE EXAM

## 2021-10-19 DIAGNOSIS — E119 Type 2 diabetes mellitus without complications: Secondary | ICD-10-CM | POA: Diagnosis not present

## 2021-10-25 ENCOUNTER — Encounter: Payer: Self-pay | Admitting: Emergency Medicine

## 2021-10-25 ENCOUNTER — Emergency Department (INDEPENDENT_AMBULATORY_CARE_PROVIDER_SITE_OTHER)
Admission: EM | Admit: 2021-10-25 | Discharge: 2021-10-25 | Disposition: A | Discharge status: Home / Self Care | Payer: Medicare Other | Source: Home / Self Care | Attending: Family Medicine | Admitting: Family Medicine

## 2021-10-25 DIAGNOSIS — J069 Acute upper respiratory infection, unspecified: Secondary | ICD-10-CM

## 2021-10-25 LAB — POCT RAPID STREP A (OFFICE): Rapid Strep A Screen: NEGATIVE

## 2021-10-25 NOTE — ED Triage Notes (Signed)
Patient c/o sore throat x 2 days, non-productive cough, chest congestion.  Patient's grandsons have strep.  Taken Tylenol w/o relief.

## 2021-10-28 ENCOUNTER — Telehealth: Payer: Self-pay | Admitting: Family Medicine

## 2021-10-28 ENCOUNTER — Ambulatory Visit (INDEPENDENT_AMBULATORY_CARE_PROVIDER_SITE_OTHER): Payer: Medicare Other | Admitting: Family Medicine

## 2021-10-28 ENCOUNTER — Encounter: Payer: Self-pay | Admitting: Family Medicine

## 2021-10-28 VITALS — BP 160/69 | HR 79 | Resp 16 | Ht 64.0 in | Wt 162.0 lb

## 2021-10-28 DIAGNOSIS — J069 Acute upper respiratory infection, unspecified: Secondary | ICD-10-CM

## 2021-10-28 DIAGNOSIS — I10 Essential (primary) hypertension: Secondary | ICD-10-CM

## 2021-10-28 DIAGNOSIS — M791 Myalgia, unspecified site: Secondary | ICD-10-CM | POA: Diagnosis not present

## 2021-10-28 DIAGNOSIS — R5383 Other fatigue: Secondary | ICD-10-CM

## 2021-10-28 DIAGNOSIS — E119 Type 2 diabetes mellitus without complications: Secondary | ICD-10-CM | POA: Diagnosis not present

## 2021-10-28 LAB — CULTURE, GROUP A STREP: Strep A Culture: NEGATIVE

## 2021-10-28 LAB — POCT GLYCOSYLATED HEMOGLOBIN (HGB A1C): Hemoglobin A1C: 6.2 % — AB (ref 4.0–5.6)

## 2021-10-28 MED ORDER — TRULICITY 3 MG/0.5ML ~~LOC~~ SOAJ: 3.0000 mg | SUBCUTANEOUS | 1 refills | Status: DC

## 2021-10-28 NOTE — Assessment & Plan Note (Signed)
Blood pressure is quite elevated today.  Repeat was actually a little higher.  But she is also been sick with cold symptoms.  It looked great when she was here in April.  We will have her monitor blood pressure at home.

## 2021-10-28 NOTE — Assessment & Plan Note (Signed)
A1c looks great.  Go ahead and decrease mealtime insulin to 10 units until she gives her Trulicity this weekend on Saturday.  Increase to 3 mg and then decrease insulin to 6 units for 1 week and then discontinue insulin completely.  Follow-up in 3 months.

## 2021-10-28 NOTE — Progress Notes (Signed)
Established Patient Office Visit  Subjective   Patient ID: Ann Torres, female    DOB: 1956-02-21  Age: 66 y.o. MRN: [id number]  Chief Complaint  Patient presents with   Diabetes    Follow up. Patient stated she is currently taking 12 units at night of Humalog. Patient states her BS has been dropping at night.    Sore Throat    Patient complains of sore throat, chest congestion and cough for 6 days.     HPI  Hypertension- Pt denies chest pain, SOB, dizziness, or heart palpitations.  Taking meds as directed w/o problems.  Denies medication side effects.    Diabetes -over the last 3 to 4 weeks she has been getting hypoglycemic episodes in the middle the night.  She is still giving about 12 units of short acting insulin before her evening meal.  No wounds or sores that are not healing well. No increased thirst or urination. Checking glucose at home. Taking medications as prescribed without any side effects.  Also on 1.5 mg of Trulicity.  Saw UC 3 days ago for ST, cough, and nasal congestion x 6 days.  Negative strep test at the urgent care as she had had a positive exposure.  Reported negative home COVID test.  She is also still being evaluated by cardiology.  They did recommend that she start a baby aspirin and start working on eating a Mediterranean diet.  CT calcium score from May 22 was 72.  Nuclear stress test on April 25 was unremarkable.  She continues to have frequent chest pain.  She also wanted to discuss that she just does not feel well in general.  She feels soreness and achiness in her muscles particularly in her arms legs but also in her back and feet.  She denies any spasms or cramping.  She just constantly feels malaise.  She also reports that she is just felt more down and depressed.  She was here today with her 2 grandsons so did not go into detail.    ROS    Objective:     BP (!) 160/69   Pulse 79   Resp 16   Ht 5\' 4"  (1.626 m)   Wt 162 lb (73.5 kg)   SpO2  97%   BMI 27.81 kg/m    Physical Exam Vitals and nursing note reviewed.  Constitutional:      Appearance: She is well-developed.  HENT:     Head: Normocephalic and atraumatic.     Right Ear: External ear normal.     Left Ear: External ear normal.     Nose: Nose normal.  Eyes:     Conjunctiva/sclera: Conjunctivae normal.     Pupils: Pupils are equal, round, and reactive to light.  Neck:     Thyroid: No thyromegaly.  Cardiovascular:     Rate and Rhythm: Normal rate and regular rhythm.     Heart sounds: Normal heart sounds.  Pulmonary:     Effort: Pulmonary effort is normal.     Breath sounds: Normal breath sounds. No wheezing.  Musculoskeletal:     Cervical back: Neck supple.  Lymphadenopathy:     Cervical: No cervical adenopathy.  Skin:    General: Skin is warm and dry.  Neurological:     Mental Status: She is alert and oriented to person, place, and time.  Psychiatric:        Behavior: Behavior normal.      Results for orders placed or performed in  visit on 10/28/21  POCT HgB A1C  Result Value Ref Range   Hemoglobin A1C 6.2 (A) 4.0 - 5.6 %   HbA1c POC (<> result, manual entry)     HbA1c, POC (prediabetic range)     HbA1c, POC (controlled diabetic range)        The ASCVD Risk score (Arnett DK, et al., 2019) failed to calculate for the following reasons:   Unable to determine if patient is Non-Hispanic African American    Assessment & Plan:   Problem List Items Addressed This Visit       Cardiovascular and Mediastinum   Essential hypertension - Primary    Blood pressure is quite elevated today.  Repeat was actually a little higher.  But she is also been sick with cold symptoms.  It looked great when she was here in April.  We will have her monitor blood pressure at home.      Relevant Medications   aspirin EC 81 MG tablet   Other Relevant Orders   Lipid Panel w/reflex Direct LDL   COMPLETE METABOLIC PANEL WITH GFR   CBC   CK (Creatine Kinase)    Sedimentation rate   TSH   Magnesium   B12     Endocrine   Diabetes mellitus without complication (HCC)    A1c looks great.  Go ahead and decrease mealtime insulin to 10 units until she gives her Trulicity this weekend on Saturday.  Increase to 3 mg and then decrease insulin to 6 units for 1 week and then discontinue insulin completely.  Follow-up in 3 months.      Relevant Medications   aspirin EC 81 MG tablet   Dulaglutide (TRULICITY) 3 MG/0.5ML SOPN   Other Relevant Orders   POCT HgB A1C (Completed)   Lipid Panel w/reflex Direct LDL   COMPLETE METABOLIC PANEL WITH GFR   CBC   CK (Creatine Kinase)   Sedimentation rate   TSH   Magnesium   B12   Other Visit Diagnoses     Myalgia       Relevant Orders   Lipid Panel w/reflex Direct LDL   COMPLETE METABOLIC PANEL WITH GFR   CBC   CK (Creatine Kinase)   Sedimentation rate   TSH   Magnesium   B12   Viral upper respiratory tract infection       Other fatigue           Myalgias/Fatigue -we will get some up-to-date blood work and evaluate CK levels etc.  Also consider it could be related to the statin so we may have her hold that for about 3 weeks to see if she feels better.  Otherwise consider alternative diagnoses.  URI -she is feeling a little better today.  Lung exam is clear.  Most likely viral.  If not feeling better by the end of the week then please give [place] a call back and we will consider trial of antibiotics at that point.    No follow-ups on file.    Nani Gasser, MD

## 2021-10-28 NOTE — Telephone Encounter (Signed)
Please call patient: Since blood pressure was high today please have her check it at home later this afternoon and let us know if it was better.  If it really is running pretty consistently greater than 130 then we need to adjust her dose of telmisartan.  Also after looking over her med list I want her to hold her statin for a couple of weeks to see if she notices any improvement in her muscle pain.  If she does feel better then please let me know.  If she does not notice any difference then please restart the medication.

## 2021-10-28 NOTE — Telephone Encounter (Signed)
Left message for patient to return call.

## 2021-10-30 DIAGNOSIS — M0579 Rheumatoid arthritis with rheumatoid factor of multiple sites without organ or systems involvement: Secondary | ICD-10-CM | POA: Diagnosis not present

## 2021-10-30 DIAGNOSIS — I10 Essential (primary) hypertension: Secondary | ICD-10-CM | POA: Diagnosis not present

## 2021-10-30 DIAGNOSIS — R931 Abnormal findings on diagnostic imaging of heart and coronary circulation: Secondary | ICD-10-CM | POA: Diagnosis not present

## 2021-10-30 DIAGNOSIS — M791 Myalgia, unspecified site: Secondary | ICD-10-CM | POA: Diagnosis not present

## 2021-10-30 DIAGNOSIS — E119 Type 2 diabetes mellitus without complications: Secondary | ICD-10-CM | POA: Diagnosis not present

## 2021-10-30 NOTE — Telephone Encounter (Signed)
Patient advised of message and verbalized understanding. Patient stated she was seen at the cardiologist today and her BP was 117/74. Patient stated she will continue to check her BP over the weekend and will let us know on Monday of her numbers.

## 2021-10-31 LAB — COMPLETE METABOLIC PANEL WITH GFR
AG Ratio: 1.7 (calc) (ref 1.0–2.5)
ALT: 18 U/L (ref 6–29)
AST: 19 U/L (ref 10–35)
Albumin: 4.3 g/dL (ref 3.6–5.1)
Alkaline phosphatase (APISO): 99 U/L (ref 37–153)
BUN: 18 mg/dL (ref 7–25)
CO2: 27 mmol/L (ref 20–32)
Calcium: 9.7 mg/dL (ref 8.6–10.4)
Chloride: 104 mmol/L (ref 98–110)
Creat: 0.71 mg/dL (ref 0.50–1.05)
Globulin: 2.5 g/dL (calc) (ref 1.9–3.7)
Glucose, Bld: 133 mg/dL — ABNORMAL HIGH (ref 65–99)
Potassium: 4.5 mmol/L (ref 3.5–5.3)
Sodium: 140 mmol/L (ref 135–146)
Total Bilirubin: 0.6 mg/dL (ref 0.2–1.2)
Total Protein: 6.8 g/dL (ref 6.1–8.1)
eGFR: 93 mL/min/{1.73_m2} (ref >=60)

## 2021-10-31 LAB — MAGNESIUM: Magnesium: 2 mg/dL (ref 1.5–2.5)

## 2021-10-31 LAB — TSH: TSH: 0.76 mIU/L (ref 0.40–4.50)

## 2021-10-31 LAB — LIPID PANEL W/REFLEX DIRECT LDL
Cholesterol: 108 mg/dL (ref <200)
HDL: 46 mg/dL — ABNORMAL LOW (ref >=50)
LDL Cholesterol (Calc): 41 mg/dL (calc)
Non-HDL Cholesterol (Calc): 62 mg/dL (calc) (ref <130)
Total CHOL/HDL Ratio: 2.3 (calc) (ref <5.0)
Triglycerides: 128 mg/dL (ref <150)

## 2021-10-31 LAB — CBC
HCT: 39.4 % (ref 35.0–45.0)
Hemoglobin: 13.1 g/dL (ref 11.7–15.5)
MCH: 26.5 pg — ABNORMAL LOW (ref 27.0–33.0)
MCHC: 33.2 g/dL (ref 32.0–36.0)
MCV: 79.6 fL — ABNORMAL LOW (ref 80.0–100.0)
MPV: 10 fL (ref 7.5–12.5)
Platelets: 239 10*3/uL (ref 140–400)
RBC: 4.95 10*6/uL (ref 3.80–5.10)
RDW: 13.8 % (ref 11.0–15.0)
WBC: 7.8 10*3/uL (ref 3.8–10.8)

## 2021-10-31 LAB — SEDIMENTATION RATE: Sed Rate: 14 mm/h (ref 0–30)

## 2021-10-31 LAB — VITAMIN B12: Vitamin B-12: 458 pg/mL (ref 200–1100)

## 2021-10-31 LAB — CK: Total CK: 161 U/L — ABNORMAL HIGH (ref 29–143)

## 2021-11-02 ENCOUNTER — Other Ambulatory Visit: Payer: Self-pay | Admitting: *Deleted

## 2021-11-02 DIAGNOSIS — M791 Myalgia, unspecified site: Secondary | ICD-10-CM

## 2021-11-02 NOTE — Progress Notes (Signed)
Hi Ann Torres, LDL looks great on the cholesterol.  Your metabolic panel including liver and kidney function is also good.  Hemoglobin is good at 13.  Your inflammatory marker is normal.  Thyroid looks good.  Magnesium is normal.  B12 looks good as well I checked this because sometimes you can become deficient especially with certain diabetes medications.  The muscle enzyme level was just slightly elevated.  Nothing in a dangerous range but certainly could reflect some increased muscle breakdown.  I want you to try cutting your Lipitor in half for about 2 to 3 weeks to see if you notice a difference in how you are feeling.  I am not 100% sure that is what is causing it but it is 1 possibility.  So reasonable to start by making an adjustment to that medication.  I like to recheck that CK level in about 2 to 3 weeks after coming down on the Lipitor.

## 2021-11-19 DIAGNOSIS — E119 Type 2 diabetes mellitus without complications: Secondary | ICD-10-CM | POA: Diagnosis not present

## 2021-11-27 ENCOUNTER — Ambulatory Visit: Payer: Medicare Other | Admitting: Sports Medicine

## 2021-11-27 DIAGNOSIS — M19041 Primary osteoarthritis, right hand: Secondary | ICD-10-CM | POA: Diagnosis not present

## 2021-11-27 DIAGNOSIS — M0579 Rheumatoid arthritis with rheumatoid factor of multiple sites without organ or systems involvement: Secondary | ICD-10-CM | POA: Diagnosis not present

## 2021-11-27 DIAGNOSIS — R748 Abnormal levels of other serum enzymes: Secondary | ICD-10-CM | POA: Diagnosis not present

## 2021-11-27 DIAGNOSIS — G8929 Other chronic pain: Secondary | ICD-10-CM | POA: Diagnosis not present

## 2021-11-27 DIAGNOSIS — M25512 Pain in left shoulder: Secondary | ICD-10-CM | POA: Diagnosis not present

## 2021-11-27 DIAGNOSIS — I10 Essential (primary) hypertension: Secondary | ICD-10-CM | POA: Diagnosis not present

## 2021-11-27 DIAGNOSIS — M19042 Primary osteoarthritis, left hand: Secondary | ICD-10-CM | POA: Diagnosis not present

## 2021-11-30 ENCOUNTER — Encounter: Payer: Self-pay | Admitting: Family Medicine

## 2021-11-30 DIAGNOSIS — I10 Essential (primary) hypertension: Secondary | ICD-10-CM

## 2021-11-30 MED ORDER — TELMISARTAN 80 MG PO TABS: 80.0000 mg | ORAL_TABLET | Freq: Every day | ORAL | 0 refills | Status: DC

## 2021-12-04 ENCOUNTER — Ambulatory Visit (INDEPENDENT_AMBULATORY_CARE_PROVIDER_SITE_OTHER): Payer: Medicare Other | Admitting: Sports Medicine

## 2021-12-04 ENCOUNTER — Ambulatory Visit (INDEPENDENT_AMBULATORY_CARE_PROVIDER_SITE_OTHER): Payer: Medicare Other

## 2021-12-04 DIAGNOSIS — M19012 Primary osteoarthritis, left shoulder: Secondary | ICD-10-CM | POA: Diagnosis not present

## 2021-12-04 DIAGNOSIS — M75102 Unspecified rotator cuff tear or rupture of left shoulder, not specified as traumatic: Secondary | ICD-10-CM | POA: Insufficient documentation

## 2021-12-04 MED ORDER — ONDANSETRON 8 MG PO TBDP: 8.0000 mg | ORAL_TABLET | Freq: Three times a day (TID) | ORAL | 3 refills | Status: DC | PRN

## 2021-12-04 MED ORDER — ACETAMINOPHEN ER 650 MG PO TBCR: 1300.0000 mg | EXTENDED_RELEASE_TABLET | Freq: Two times a day (BID) | ORAL | Status: DC

## 2021-12-04 NOTE — Progress Notes (Signed)
    Procedures performed today:    Procedure: Real-time Ultrasound Guided injection of the left glenohumeral joint Device: Samsung HS60  Verbal informed consent obtained.  Time-out conducted.  Noted no overlying erythema, induration, or other signs of local infection.  Skin prepped in a sterile fashion.  Local anesthesia: Topical Ethyl chloride.  With sterile technique and under real time ultrasound guidance: Arthritic joint noted, 1 cc Kenalog 40, 2 cc lidocaine, 2 cc bupivacaine injected easily Completed without difficulty  Advised to call if fevers/chills, erythema, induration, drainage, or persistent bleeding.  Images permanently stored and available for review in PACS.  Impression: Technically successful ultrasound guided injection.  Independent interpretation of notes and tests performed by another provider:   None.  Brief History, Exam, Impression, and Recommendations:    Primary osteoarthritis of left shoulder This is a very pleasant 66 year old female with rheumatoid arthritis, she had increasing pain left shoulder, she had therapy, medications, nothing helps, ultimately x-rays did show glenohumeral osteoarthritis, she does have pain with internal rotation and abduction, today we did a glenohumeral joint injection, return to see me in about 6 weeks. There was also some paresthesias and numbness and tingling going down the left arm, I did advise her that this was likely radicular and if it did not improve after the glenohumeral joint injection (and I do not expect it to improve) then we would further evaluate her cervical spine. Adding arthritis strength Tylenol 2 tabs twice a day and some Zofran to help her nausea with her Trulicity injections.   ____________________________________________ Ihor Austin. Benjamin Stain, M.D., ABFM., CAQSM., AME. Primary Care and Sports Medicine [facility]  Adjunct Professor of Family Medicine  [employer]  Restaurant manager, fast food

## 2021-12-04 NOTE — Assessment & Plan Note (Addendum)
This is a very pleasant 66 year old female with rheumatoid arthritis, she had increasing pain left shoulder, she had therapy, medications, nothing helps, ultimately x-rays did show glenohumeral osteoarthritis, she does have pain with internal rotation and abduction, today we did a glenohumeral joint injection, return to see me in about 6 weeks. There was also some paresthesias and numbness and tingling going down the left arm, I did advise her that this was likely radicular and if it did not improve after the glenohumeral joint injection (and I do not expect it to improve) then we would further evaluate her cervical spine. Adding arthritis strength Tylenol 2 tabs twice a day and some Zofran to help her nausea with her Trulicity injections.

## 2021-12-04 NOTE — Progress Notes (Signed)
le

## 2021-12-12 ENCOUNTER — Other Ambulatory Visit: Payer: Self-pay | Admitting: Family Medicine

## 2021-12-12 DIAGNOSIS — E119 Type 2 diabetes mellitus without complications: Secondary | ICD-10-CM

## 2021-12-12 DIAGNOSIS — I10 Essential (primary) hypertension: Secondary | ICD-10-CM

## 2021-12-17 ENCOUNTER — Ambulatory Visit (INDEPENDENT_AMBULATORY_CARE_PROVIDER_SITE_OTHER): Payer: Medicare Other | Admitting: Pharmacist

## 2021-12-17 DIAGNOSIS — E119 Type 2 diabetes mellitus without complications: Secondary | ICD-10-CM

## 2021-12-17 DIAGNOSIS — I10 Essential (primary) hypertension: Secondary | ICD-10-CM

## 2021-12-17 DIAGNOSIS — E785 Hyperlipidemia, unspecified: Secondary | ICD-10-CM

## 2021-12-17 NOTE — Progress Notes (Signed)
Chronic Care Management Pharmacy Note  12/17/2021 Name:  Ann Torres MRN:  [id number] DOB:  1955-06-23  Summary: addressed HTN, HLD, DM. She currently uses 10-15 units of insulin once per day in late afternoon, but will be transitioning off insulin when increasing trulicity to 3mg  weekly dosing. She uses zofran to get through the increased dose adjustment period. Previously obtained Quinter 2 using DME. She loves CGM, and prefers using the reader device instead of her smart phone app.  Recommendations/Changes made from today's visit:  - no changes to medication, provided education regarding glycemic goals - provided patient with plan for insulin decreasing dose/discontinuation when starting trulicity 3mg  weekly.  Plan: f/u with pharmacist next week in office to provide CGM sensor sample while company updating renewal shipment is still pending.  Subjective: Ann Torres is an 66 y.o. year old female who is a primary patient of Metheney, [name], MD.  The CCM team was consulted for assistance with disease management and care coordination needs.    Engaged with patient by telephone for follow up visit in response to provider referral for pharmacy case management and/or care coordination services.   Consent to Services:  The patient was given information about Chronic Care Management services, agreed to services, and gave verbal consent prior to initiation of services.  Please see initial visit note for detailed documentation.   Patient Care Team: 78, MD as PCP - General (Family Medicine) Barbarann Ehlers, [facility] as Pharmacist (Pharmacist)  Recent office visits:  11/26/20-Catherine Metheney, MD (PCP) Seen for Diabetes Mellitus.Labs ordered. Follow up in 4 months. 08/04/20-Catherine Metheney, MD (PCP) General follow up. Start on EPINEPHrine 0.3 mg/0.3 mL IJ SOAJ injection as needed for honey bee venom allergy. Bone density ordered. Follow up in 3 months. Recent consult  visits:  None noted   Hospital visits:  None in previous 6 months  Objective:  Lab Results  Component Value Date   CREATININE 0.71 10/30/2021   CREATININE 0.71 11/26/2020   CREATININE 0.70 08/04/2020    Lab Results  Component Value Date   HGBA1C 6.2 (A) 10/28/2021   Last diabetic Eye exam:  Lab Results  Component Value Date/Time   HMDIABEYEEXA No Retinopathy 03/12/2020 12:00 AM    Last diabetic Foot exam: No results found for: "HMDIABFOOTEX"      Component Value Date/Time   CHOL 108 10/30/2021 0000   CHOL 174 08/23/2018 1006   TRIG 128 10/30/2021 0000   HDL 46 (L) 10/30/2021 0000   HDL 43 08/23/2018 1006   CHOLHDL 2.3 10/30/2021 0000   LDLCALC 41 10/30/2021 0000       Latest Ref Rng & Units 10/30/2021   12:00 AM 11/26/2020   12:00 AM 12/07/2019    8:08 AM  Hepatic Function  Total Protein 6.1 - 8.1 g/dL 6.8  7.0  6.6   AST 10 - 35 U/L 19  15  14    ALT 6 - 29 U/L 18  14  13    Total Bilirubin 0.2 - 1.2 mg/dL 0.6  0.5  0.6     Lab Results  Component Value Date/Time   TSH 0.76 10/30/2021 12:00 AM   TSH 0.86 12/07/2019 08:08 AM       Latest Ref Rng & Units 10/30/2021   12:00 AM 12/07/2019    8:08 AM 03/14/2019    8:32 AM  CBC  WBC 3.8 - 10.8 Thousand/uL 7.8  8.1    Hemoglobin 11.7 - 15.5 g/dL 02/06/2020  13.6  13.4   Hematocrit 35.0 - 45.0 % 39.4  41.2  40.4   Platelets 140 - 400 Thousand/uL 239  228       Social History   Tobacco Use  Smoking Status Former   Types: Cigarettes   Quit date: 08/01/2017   Years since quitting: 4.3  Smokeless Tobacco Never   BP Readings from Last 3 Encounters:  10/28/21 (!) 160/69  10/25/21 (!) 143/84  08/18/21 138/65   Pulse Readings from Last 3 Encounters:  10/28/21 79  10/25/21 79  08/18/21 77   Wt Readings from Last 3 Encounters:  10/28/21 162 lb (73.5 kg)  10/25/21 160 lb (72.6 kg)  08/18/21 165 lb (74.8 kg)    Assessment: Review of patient past medical history, allergies, medications, health status, including  review of consultants reports, laboratory and other test data, was performed as part of comprehensive evaluation and provision of chronic care management services.   SDOH:  (Social Determinants of Health) assessments and interventions performed:    CCM Care Plan  Allergies  Allergen Reactions   Bee Venom Anaphylaxis and Hives   Lisinopril Shortness Of Breath   Azathioprine Nausea And Vomiting   Celecoxib Other (See Comments)    Patient reported   Metformin And Related Other (See Comments)    Diarrhea and vomiting   Red Dye Other (See Comments)   Enbrel [Etanercept] Other (See Comments)    ENT felt was causing hearing loss   [place] [Dapagliflozin] Other (See Comments)    Recurrent yeast infections   Hydroxychloroquine Rash   Methotrexate Derivatives Rash   Tape Rash    r    Medications Reviewed Today     Reviewed by [name], [facility] (Pharmacist) on 12/17/21 at 0927  Med List Status: <None>   Medication Order Taking? Sig Documenting Provider Last Dose Status Informant  Abatacept 125 MG/ML SOAJ [id number] Yes Inject 1 Dose into the skin once a week. Sundays [provider] Taking Active   acetaminophen (TYLENOL) 650 MG CR tablet [id number] Yes Take 2 tablets (1,300 mg total) by mouth in the morning and at bedtime. Monica Becton, MD Taking Active   albuterol (VENTOLIN HFA) 108 (90 Base) MCG/ACT inhaler [id number] Yes Inhale 2 puffs into the lungs every 6 (six) hours as needed for wheezing or shortness of breath. [name], MD Taking Active   Kipp Laurence MEDICATION [id number] Yes Rolling walker, please take Rx to medical supply store. Monica Becton, MD Taking Active   aspirin EC 81 MG tablet [id number] Yes Take 81 mg by mouth daily. Swallow whole. [provider] Taking Active            Med Note Tyler Deis Dec 17, 2021  9:23 AM) Switched to every other day per cardiology as of 12/17/21 review  atorvastatin  (LIPITOR) 20 MG tablet [id number] Yes Take 1 tablet (20 mg total) by mouth at bedtime. [name], MD Taking Active   Calcium-Vitamin D-Vitamin K Kenna Gilbert CALCIUM PLUS D) 650-12.5-40 MG-MCG-MCG CHEW [id number] Yes Chew 2 tablets by mouth in the morning and at bedtime. [provider] Taking Active   Dulaglutide (TRULICITY) 3 MG/0.5ML SOPN [id number] Yes Inject 3 mg as directed once a week. [name], MD Taking Active   EPINEPHrine 0.3 mg/0.3 mL IJ SOAJ injection [id number] Yes Inject 0.3 mg into the muscle as needed for anaphylaxis. [name], MD Taking Active   estradiol [facility] VAGINAL) 0.1 MG/GM vaginal cream  [id number] Yes Place 1 Applicatorful vaginally at bedtime. For 2 weeks then 3 times a week. [name], MD Taking Active   Insulin Pen Needle (TRUEPLUS PEN NEEDLES) 31G X 6 MM MISC [id number] No Use to inject insulin.  Patient not taking: Reported on 12/17/2021   [name], MD Not Taking Active   INSULIN SYRINGE 1CC/29G 29G X 1/2" 1 ML MISC [id number] Yes Use up to TID for insulin. Ok to sub if needed as covered by insurance. [name], MD Taking Active   mupirocin ointment (BACTROBAN) 2 % [id number] Yes Apply topically 2 (two) times daily. [name], MD Taking Active   omeprazole (PRILOSEC) 20 MG capsule [id number] Yes Take 1 capsule (20 mg total) by mouth daily. [name], MD Taking Active   ondansetron (ZOFRAN-ODT) 8 MG disintegrating tablet [id number] Yes Take 1 tablet (8 mg total) by mouth every 8 (eight) hours as needed for nausea. Monica Becton, MD Taking Active   telmisartan (MICARDIS) 80 MG tablet [id number] Yes Take 1 tablet (80 mg total) by mouth daily. [name], MD Taking Active             Patient Active Problem List   Diagnosis Date Noted   Primary osteoarthritis of left shoulder 12/04/2021   History of herpes genitalis 05/11/2021   Vaginal atrophy 05/11/2021    Vaginal irritation 05/11/2021   Lumbar spinal stenosis with right-sided radiculitis 01/12/2021   Osteopenia 08/27/2020   Allergy to honey bee venom 08/04/2020   Memory impairment 12/06/2019   Trigger finger 08/07/2019   Primary insomnia 03/23/2018   Long-term use of high-risk medication 02/06/2018   Calcific Achilles tendinitis of left lower extremity 10/06/2017   Haglund's deformity of left heel 10/06/2017   Essential hypertension 02/01/2017   Rheumatoid arthritis involving multiple sites (HCC) 02/01/2017   Diabetes mellitus without complication (HCC) 02/01/2017   History of colon polyps 02/01/2017   Depression, major, single episode, complete remission (HCC) 02/01/2017   Hyperlipidemia 02/01/2017    Immunization History  Administered Date(s) Administered   Fluad Quad(high Dose 65+) 01/30/2021   Influenza,inj,Quad PF,6+ Mos 02/01/2017, 01/24/2018, 01/15/2019, 01/31/2020   Janssen (J&J) SARS-COV-2 Vaccination 07/21/2019   [name](Purple Top)SARS-COV-2 Vaccination 02/20/2020, 08/05/2020, 01/30/2021   PNEUMOCOCCAL CONJUGATE-20 03/30/2021   Pneumococcal Conjugate-13 03/06/2020   Tdap 07/01/2012    Conditions to be addressed/monitored: HTN, HLD, and DMII  There are no care plans that you recently modified to display for this patient.    Medication Assistance: None required.  Patient affirms current coverage meets needs.  Patient's preferred pharmacy is:  [facility] [place], [place] - [address] [place] [address] [place] [address] Phone: [phone] Fax: [phone]  [facility] [address] [place], [place] - [address] [place] [address] Phone: [phone] Fax: [phone]  [facility] [address] [place] - [address] [place] [address] Phone: [phone] Fax: [phone]   Uses pill box? Yes "Mediplanner" sets up  for 3 weeks Pt endorses 100% compliance  Follow Up:  Patient agrees to Care Plan and Follow-up.  Plan: Face to Face appointment with care management team member scheduled for: 1 week  Lynnda Shields, PharmD Clinical Pharmacist [facility] [phone]

## 2021-12-17 NOTE — Patient Instructions (Signed)
[name] chatting with you today. I have submitted your Ann Torres 2 for renewal, and I will see you next Wednesday in office!!  Visit Information  Thank you for taking time to visit with me today. Please don't hesitate to contact me if I can be of assistance to you before our next scheduled telephone appointment.  Following are the goals we discussed today:    Patient Goals/Self-Care Activities Over the next 7 days, patient will:  take medications as prescribed  Follow Up Plan: Face to face follow up appointment with care management team member scheduled for:  1 week  Please call the care guide team at [phone] if you need to cancel or reschedule your appointment.   Patient verbalizes understanding of instructions and care plan provided today and agrees to view in MyChart. Active MyChart status and patient understanding of how to access instructions and care plan via MyChart confirmed with patient.     [name]

## 2021-12-21 DIAGNOSIS — E119 Type 2 diabetes mellitus without complications: Secondary | ICD-10-CM | POA: Diagnosis not present

## 2021-12-23 ENCOUNTER — Ambulatory Visit: Payer: Medicare Other | Admitting: Pharmacist

## 2021-12-23 DIAGNOSIS — E119 Type 2 diabetes mellitus without complications: Secondary | ICD-10-CM

## 2021-12-23 DIAGNOSIS — I10 Essential (primary) hypertension: Secondary | ICD-10-CM

## 2021-12-23 DIAGNOSIS — E785 Hyperlipidemia, unspecified: Secondary | ICD-10-CM

## 2021-12-23 NOTE — Patient Instructions (Signed)
Visit Information  Thank you for taking time to visit with me today. Please don't hesitate to contact me if I can be of assistance to you before our next scheduled telephone appointment.  Following are the goals we discussed today:   Patient Goals/Self-Care Activities Over the next 7 days, patient will:  take medications as prescribed, test out [place] 3 sample (compared to [place] 2)  Follow Up Plan: Telephone follow up appointment with care management team member scheduled for:  1-2 week  Please call the care guide team at [phone] if you need to cancel or reschedule your appointment.   Patient verbalizes understanding of instructions and care plan provided today and agrees to view in MyChart. Active MyChart status and patient understanding of how to access instructions and care plan via MyChart confirmed with patient.     Ann Torres

## 2021-12-23 NOTE — Progress Notes (Signed)
Chronic Care Management Pharmacy Note  12/23/2021 Name:  Ann Torres MRN:  [id number] DOB:  11/12/1955  Summary: addressed HTN, HLD, DM.  [facility] 3 sensor in office today using sample. She uses [place] 2 currently, obtained through DME.   She currently uses 10-15 units of insulin once per day in late afternoon, but will be transitioning off insulin when increasing trulicity to 3mg  weekly dosing. She uses zofran to get through the increased dose adjustment period.  Recommendations/Changes made from today's visit:  - Reprocessed her prescription through DME for [facility] 2. Patient will decide if she likes the [place] 3 instead of 2, and if so, I will change the RX through the DME company via [facility].  - no changes to medication, provided education regarding glycemic goals - provided patient with plan for insulin decreasing dose/discontinuation when starting trulicity 3mg  weekly.  Plan: f/u with pharmacist in 1-2 weeks to review CGM   Subjective: Ann Torres is an 66 y.o. year old female who is a primary patient of Torres, Ann Kocher, MD.  The CCM team was consulted for assistance with disease management and care coordination needs.    Engaged with patient by telephone for follow up visit in response to provider referral for pharmacy case management and/or care coordination services.   Consent to Services:  The patient was given information about Chronic Care Management services, agreed to services, and gave verbal consent prior to initiation of services.  Please see initial visit note for detailed documentation.   Patient Care Team: Ann Marry, MD as PCP - General (Family Medicine) Ann Torres, [facility] as Pharmacist (Pharmacist)  Recent office visits:  11/26/20-Ann Metheney, MD (PCP) Seen for Diabetes Mellitus.Labs ordered. Follow up in 4 months. 08/04/20-Ann Metheney, MD (PCP) General follow up. Start on EPINEPHrine 0.3 mg/0.3 mL IJ SOAJ  injection as needed for honey bee venom allergy. Bone density ordered. Follow up in 3 months. Recent consult visits:  None noted   Hospital visits:  None in previous 6 months  Objective:  Lab Results  Component Value Date   CREATININE 0.71 10/30/2021   CREATININE 0.71 11/26/2020   CREATININE 0.70 08/04/2020    Lab Results  Component Value Date   HGBA1C 6.2 (A) 10/28/2021   Last diabetic Eye exam:  Lab Results  Component Value Date/Time   HMDIABEYEEXA No Retinopathy 03/12/2020 12:00 AM    Last diabetic Foot exam: No results found for: "HMDIABFOOTEX"      Component Value Date/Time   CHOL 108 10/30/2021 0000   CHOL 174 08/23/2018 1006   TRIG 128 10/30/2021 0000   HDL 46 (L) 10/30/2021 0000   HDL 43 08/23/2018 1006   CHOLHDL 2.3 10/30/2021 0000   LDLCALC 41 10/30/2021 0000       Latest Ref Rng & Units 10/30/2021   12:00 AM 11/26/2020   12:00 AM 12/07/2019    8:08 AM  Hepatic Function  Total Protein 6.1 - 8.1 g/dL 6.8  7.0  6.6   AST 10 - 35 U/L 19  15  14    ALT 6 - 29 U/L 18  14  13    Total Bilirubin 0.2 - 1.2 mg/dL 0.6  0.5  0.6     Lab Results  Component Value Date/Time   TSH 0.76 10/30/2021 12:00 AM   TSH 0.86 12/07/2019 08:08 AM       Latest Ref Rng & Units 10/30/2021   12:00 AM 12/07/2019    8:08 AM 03/14/2019  8:32 AM  CBC  WBC 3.8 - 10.8 Thousand/uL 7.8  8.1    Hemoglobin 11.7 - 15.5 g/dL 41.9  62.2  29.7   Hematocrit 35.0 - 45.0 % 39.4  41.2  40.4   Platelets 140 - 400 Thousand/uL 239  228       Social History   Tobacco Use  Smoking Status Former   Types: Cigarettes   Quit date: 08/01/2017   Years since quitting: 4.3  Smokeless Tobacco Never   BP Readings from Last 3 Encounters:  10/28/21 (!) 160/69  10/25/21 (!) 143/84  08/18/21 138/65   Pulse Readings from Last 3 Encounters:  10/28/21 79  10/25/21 79  08/18/21 77   Wt Readings from Last 3 Encounters:  10/28/21 162 lb (73.5 kg)  10/25/21 160 lb (72.6 kg)  08/18/21 165 lb (74.8  kg)    Assessment: Review of patient past medical history, allergies, medications, health status, including review of consultants reports, laboratory and other test data, was performed as part of comprehensive evaluation and provision of chronic care management services.   SDOH:  (Social Determinants of Health) assessments and interventions performed:    CCM Care Plan  Allergies  Allergen Reactions   Bee Venom Anaphylaxis and Hives   Lisinopril Shortness Of Breath   Azathioprine Nausea And Vomiting   Celecoxib Other (See Comments)    Patient reported   Metformin And Related Other (See Comments)    Diarrhea and vomiting   Red Dye Other (See Comments)   Enbrel [Etanercept] Other (See Comments)    ENT felt was causing hearing loss   [place] [Dapagliflozin] Other (See Comments)    Recurrent yeast infections   Hydroxychloroquine Rash   Methotrexate Derivatives Rash   Tape Rash    r    Medications Reviewed Today     Reviewed by Ann Torres, [facility] (Pharmacist) on 12/23/21 at 1647  Med List Status: <None>   Medication Order Taking? Sig Documenting Provider Last Dose Status Informant  Abatacept 125 MG/ML SOAJ [id number] No Inject 1 Dose into the skin once a week. Sundays [provider] Taking Active   acetaminophen (TYLENOL) 650 MG CR tablet [id number] No Take 2 tablets (1,300 mg total) by mouth in the morning and at bedtime. Monica Becton, MD Taking Active   albuterol (VENTOLIN HFA) 108 (90 Base) MCG/ACT inhaler [id number] No Inhale 2 puffs into the lungs every 6 (six) hours as needed for wheezing or shortness of breath. [name], MD Taking Active   Kipp Laurence MEDICATION [id number] No Rolling walker, please take Rx to medical supply store. Monica Becton, MD Taking Active   aspirin EC 81 MG tablet [id number] No Take 81 mg by mouth daily. Swallow whole. [provider] Taking Active            Med Note Tyler Deis  Dec 17, 2021  9:23 AM) Switched to every other day per cardiology as of 12/17/21 review  atorvastatin (LIPITOR) 20 MG tablet [id number] No Take 1 tablet (20 mg total) by mouth at bedtime. [name], MD Taking Active   Calcium-Vitamin D-Vitamin K Kenna Gilbert CALCIUM PLUS D) 650-12.5-40 MG-MCG-MCG CHEW [id number] No Chew 2 tablets by mouth in the morning and at bedtime. [provider] Taking Active   Dulaglutide (TRULICITY) 3 MG/0.5ML SOPN [id number] No Inject 3 mg as directed once a week. [name], MD Taking Active   EPINEPHrine 0.3 mg/0.3 mL IJ SOAJ injection [id number] No  Inject 0.3 mg into the muscle as needed for anaphylaxis. Ann Marry, MD Taking Active   estradiol [facility] VAGINAL) 0.1 MG/GM vaginal cream [id number] No Place 1 Applicatorful vaginally at bedtime. For 2 weeks then 3 times a week. Ann Marry, MD Taking Active   Insulin Pen Needle (TRUEPLUS PEN NEEDLES) 31G X 6 MM MISC [id number] No Use to inject insulin.  Patient not taking: Reported on 12/17/2021   Ann Marry, MD Not Taking Active   INSULIN SYRINGE 1CC/29G 29G X 1/2" 1 ML MISC [id number] No Use up to TID for insulin. Ok to sub if needed as covered by insurance. Ann Marry, MD Taking Active   mupirocin ointment (BACTROBAN) 2 % [id number] No Apply topically 2 (two) times daily. Ann Marry, MD Taking Active   omeprazole (PRILOSEC) 20 MG capsule [id number] No Take 1 capsule (20 mg total) by mouth daily. Ann Marry, MD Taking Active   ondansetron (ZOFRAN-ODT) 8 MG disintegrating tablet [id number] No Take 1 tablet (8 mg total) by mouth every 8 (eight) hours as needed for nausea. Silverio Decamp, MD Taking Active   telmisartan (MICARDIS) 80 MG tablet [id number] No Take 1 tablet (80 mg total) by mouth daily. Ann Marry, MD Taking Active             Patient Active Problem List   Diagnosis Date Noted   Primary osteoarthritis of left  shoulder 12/04/2021   History of herpes genitalis 05/11/2021   Vaginal atrophy 05/11/2021   Vaginal irritation 05/11/2021   Lumbar spinal stenosis with right-sided radiculitis 01/12/2021   Osteopenia 08/27/2020   Allergy to honey bee venom 08/04/2020   Memory impairment 12/06/2019   Trigger finger 08/07/2019   Primary insomnia 03/23/2018   Long-term use of high-risk medication 02/06/2018   Calcific Achilles tendinitis of left lower extremity 10/06/2017   Haglund's deformity of left heel 10/06/2017   Essential hypertension 02/01/2017   Rheumatoid arthritis involving multiple sites ([place]) 02/01/2017   Diabetes mellitus without complication ([place]) [id number]   History of colon polyps 02/01/2017   Depression, major, single episode, complete remission ([place]) 02/01/2017   Hyperlipidemia 02/01/2017    Immunization History  Administered Date(s) Administered   Fluad Quad(high Dose 65+) 01/30/2021   Influenza,inj,Quad PF,6+ Mos 02/01/2017, 01/24/2018, 01/15/2019, 01/31/2020   Janssen (J&J) SARS-COV-2 Vaccination 07/21/2019   [name](Purple Top)SARS-COV-2 Vaccination 02/20/2020, 08/05/2020, 01/30/2021   PNEUMOCOCCAL CONJUGATE-20 03/30/2021   Pneumococcal Conjugate-13 03/06/2020   Tdap 07/01/2012    Conditions to be addressed/monitored: HTN, HLD, and DMII  Care Plan : Medication Management  Updates made by Ann Torres, [place] since 12/23/2021 12:00 AM     Problem: HTN, DM, HLD      Long-Range Goal: Disease Progression Prevention   Start Date: 12/04/2020  Recent Progress: On track  Priority: High  Note:   Current Barriers:  None at present  Pharmacist Clinical Goal(s):  Over the next 60 days, patient will maintain control of chronic conditions as evidenced by medication fill history, lab values, and vital signs  through collaboration with PharmD and provider.   Interventions: 1:1 collaboration with Ann Marry, MD regarding development and update of comprehensive plan of  care as evidenced by provider attestation and co-signature Inter-disciplinary care team collaboration (see longitudinal plan of care) Comprehensive medication review performed; medication list updated in electronic medical record  Diabetes:  Controlled; current treatment:Trulicity 3mg  weekly, insulin 75/25 injects 10 units in late afternoon;  Current glucose readings: fasting glucose: 130s, post prandial  glucose: 170-190  Denies hypoglycemic/hyperglycemic symptoms  Current meal patterns: breakfast: egg, fruit, or 1/2 of mini bagel + fruit; lunch: protein shake, 1/2 tuna sandwich in pita pocket w/onion, fruit; dinner: chicken & veggies, meatloaf + yams, fish, spaghetti, sandwiches; snacks: string cheese, graham crackers, fruit, grapes, ice cream; drinks: milk and water   Current exercise: watches grandsons, [facility] [employer], aqua aerobics, walk, outside playing  Educated on mechanism of medications to explain why she is feeling full Recommended continue current regimen, resubmitted request for DME to process CGM sensor RX. Hypertension:  Controlled; current treatment:telmisartan 80mg  daily;   Current home readings: 120s/70-80s,    Denies hypotensive/hypertensive symptoms  Recommended continue current regimen,  Hyperlipidemia:  Controlled; current treatment:atorvastatin 20mg  ; LDL 69  Recommended continue current regimen  Patient Goals/Self-Care Activities Over the next 7 days, patient will:  take medications as prescribed, test out [place] 3 sample (compared to [place] 2)  Follow Up Plan: Telephone follow up appointment with care management team member scheduled for:  1-2 week      Medication Assistance: None required.  Patient affirms current coverage meets needs.  Patient's preferred pharmacy is:  [facility] [place], [facility] - [address] [place] [address] [place] [address] [place] Phone: [phone] Fax: [phone]  [facility]  [place] [address], [facility] - [place] [address] [place] [address] [place] [address] [address] Phone: [phone] Fax: [phone]  [facility] [address] [facility] - [address] [place] Phone: [phone] Fax: [phone]   Uses pill box? Yes "Mediplanner" sets up for 3 weeks Pt endorses 100% compliance  Follow Up:  Patient agrees to Care Plan and Follow-up.  Plan: Telephone follow up appointment with care management team member scheduled for:  1-2 weeks  [phone], PharmD Clinical Pharmacist [facility] [phone]

## 2021-12-31 ENCOUNTER — Ambulatory Visit: Payer: Medicare Other | Admitting: Pharmacist

## 2021-12-31 DIAGNOSIS — E785 Hyperlipidemia, unspecified: Secondary | ICD-10-CM

## 2021-12-31 DIAGNOSIS — I10 Essential (primary) hypertension: Secondary | ICD-10-CM

## 2021-12-31 DIAGNOSIS — Z794 Long term (current) use of insulin: Secondary | ICD-10-CM | POA: Diagnosis not present

## 2021-12-31 DIAGNOSIS — E1159 Type 2 diabetes mellitus with other circulatory complications: Secondary | ICD-10-CM

## 2021-12-31 DIAGNOSIS — Z7985 Long-term (current) use of injectable non-insulin antidiabetic drugs: Secondary | ICD-10-CM

## 2021-12-31 DIAGNOSIS — E119 Type 2 diabetes mellitus without complications: Secondary | ICD-10-CM

## 2021-12-31 NOTE — Progress Notes (Signed)
Chronic Care Management Pharmacy Note  01/01/2022 Name:  Ann Torres MRN:  [id number] DOB:  1955-10-25  Summary: addressed HTN, HLD, DM.  She uses [place] 2 currently, obtained through DME. We applied a Libre 3 sensor as a sample of the newer model.  She has postprandial  glucose spikes and gives 3 units of her humalog as sort of a sliding scale.  In-depth discussion and review of nutrition: atkins protein shake, or scrambled eggs & toast - results in BG 267.  She has increased to trulicity 3mg  dose x2 injections thus far, and uses zofran to get through the increased dose adjustment period.  Date of Download: 12/31/2021 % Time CGM is active: 57% Average Glucose: 151 mg/dL Glucose Management Indicator: 6.9%  Glucose Variability: 19.3 (goal <36%) Time in Goal:  - Time in range 70-180: 85% - Time above range: 15% - Time below range: 0% Observed patterns: she has postprandial spikes despite a relatively balanced meal and protein focused nutrition.   Recommendations/Changes made from today's visit:  - She enjoys [place] 3 instead of 2, I have facilitated RX through the DME company via [employer]. - no changes to medication, her current approach of 0-3 units to cover post prandial spikes is appropriate for now, hoping to taper these off completely  Plan: f/u with pharmacist in 3-4 weeks  Subjective: MESHAWN Torres is an 66 y.o. year old female who is a primary patient of Metheney, Rene Kocher, MD.  The CCM team was consulted for assistance with disease management and care coordination needs.    Engaged with patient by telephone for follow up visit in response to provider referral for pharmacy case management and/or care coordination services.   Consent to Services:  The patient was given information about Chronic Care Management services, agreed to services, and gave verbal consent prior to initiation of services.  Please see initial visit note for detailed documentation.    Patient Care Team: Hali Marry, MD as PCP - General (Family Medicine) Darius Bump, [facility] as Pharmacist (Pharmacist)  Recent office visits:  11/26/20-Catherine Metheney, MD (PCP) Seen for Diabetes Mellitus.Labs ordered. Follow up in 4 months. 08/04/20-Catherine Metheney, MD (PCP) General follow up. Start on EPINEPHrine 0.3 mg/0.3 mL IJ SOAJ injection as needed for honey bee venom allergy. Bone density ordered. Follow up in 3 months. Recent consult visits:  None noted   Hospital visits:  None in previous 6 months  Objective:  Lab Results  Component Value Date   CREATININE 0.71 10/30/2021   CREATININE 0.71 11/26/2020   CREATININE 0.70 08/04/2020    Lab Results  Component Value Date   HGBA1C 6.2 (A) 10/28/2021   Last diabetic Eye exam:  Lab Results  Component Value Date/Time   HMDIABEYEEXA No Retinopathy 03/12/2020 12:00 AM    Last diabetic Foot exam: No results found for: "HMDIABFOOTEX"      Component Value Date/Time   CHOL 108 10/30/2021 0000   CHOL 174 08/23/2018 1006   TRIG 128 10/30/2021 0000   HDL 46 (L) 10/30/2021 0000   HDL 43 08/23/2018 1006   CHOLHDL 2.3 10/30/2021 0000   LDLCALC 41 10/30/2021 0000       Latest Ref Rng & Units 10/30/2021   12:00 AM 11/26/2020   12:00 AM 12/07/2019    8:08 AM  Hepatic Function  Total Protein 6.1 - 8.1 g/dL 6.8  7.0  6.6   AST 10 - 35 U/L 19  15  14    ALT 6 -  29 U/L 18  14  13    Total Bilirubin 0.2 - 1.2 mg/dL 0.6  0.5  0.6     Lab Results  Component Value Date/Time   TSH 0.76 10/30/2021 12:00 AM   TSH 0.86 12/07/2019 08:08 AM       Latest Ref Rng & Units 10/30/2021   12:00 AM 12/07/2019    8:08 AM 03/14/2019    8:32 AM  CBC  WBC 3.8 - 10.8 Thousand/uL 7.8  8.1    Hemoglobin 11.7 - 15.5 g/dL 13.1  13.6  13.4   Hematocrit 35.0 - 45.0 % 39.4  41.2  40.4   Platelets 140 - 400 Thousand/uL 239  228       Social History   Tobacco Use  Smoking Status Former   Types: Cigarettes   Quit date: 08/01/2017    Years since quitting: 4.4  Smokeless Tobacco Never   BP Readings from Last 3 Encounters:  10/28/21 (!) 160/69  10/25/21 (!) 143/84  08/18/21 138/65   Pulse Readings from Last 3 Encounters:  10/28/21 79  10/25/21 79  08/18/21 77   Wt Readings from Last 3 Encounters:  10/28/21 162 lb (73.5 kg)  10/25/21 160 lb (72.6 kg)  08/18/21 165 lb (74.8 kg)    Assessment: Review of patient past medical history, allergies, medications, health status, including review of consultants reports, laboratory and other test data, was performed as part of comprehensive evaluation and provision of chronic care management services.   SDOH:  (Social Determinants of Health) assessments and interventions performed:  SDOH Interventions    Flowsheet Row Office Visit from 03/30/2021 in [place] Office Visit from 11/26/2020 in [place] Office Visit from 05/18/2018 in [place] Office Visit from 03/23/2018 in [place] Interventions      Depression Interventions/Treatment  PHQ2-9 Score <4 Follow-up Not Indicated PHQ2-9 Score <4 Follow-up Not Indicated Currently on Treatment Counseling  [pt would like to restart celexa]       CCM Care Plan  Allergies  Allergen Reactions   Bee Venom Anaphylaxis and Hives   Lisinopril Shortness Of Breath   Azathioprine Nausea And Vomiting   Celecoxib Other (See Comments)    Patient reported   Metformin And Related Other (See Comments)    Diarrhea and vomiting   Red Dye Other (See Comments)   Enbrel [Etanercept] Other (See Comments)    ENT felt was causing hearing loss   [place] [Dapagliflozin] Other (See Comments)    Recurrent yeast infections   Hydroxychloroquine Rash   Methotrexate Derivatives Rash   Tape Rash    r    Medications Reviewed Today     Reviewed by Darius Bump, [facility] (Pharmacist) on  12/23/21 at 1647  Med List Status: <None>   Medication Order Taking? Sig Documenting Provider Last Dose Status Informant  Abatacept 125 MG/ML SOAJ [id number] No Inject 1 Dose into the skin once a week. Sundays [provider] Taking Active   acetaminophen (TYLENOL) 650 MG CR tablet [id number] No Take 2 tablets (1,300 mg total) by mouth in the morning and at bedtime. Silverio Decamp, MD Taking Active   albuterol (VENTOLIN HFA) 108 (90 Base) MCG/ACT inhaler [id number] No Inhale 2 puffs into the lungs every 6 (six) hours as needed for wheezing or shortness of breath. Hali Marry, MD Taking Active   AMBULATORY NON FORMULARY MEDICATION  [id number] No Rolling walker, please take Rx to medical supply store. Monica Becton, MD Taking Active   aspirin EC 81 MG tablet [id number] No Take 81 mg by mouth daily. Swallow whole. [provider] Taking Active            Med Note Tyler Deis Dec 17, 2021  9:23 AM) Switched to every other day per cardiology as of 12/17/21 review  atorvastatin (LIPITOR) 20 MG tablet [id number] No Take 1 tablet (20 mg total) by mouth at bedtime. [name], MD Taking Active   Calcium-Vitamin D-Vitamin K Kenna Gilbert CALCIUM PLUS D) 650-12.5-40 MG-MCG-MCG CHEW [id number] No Chew 2 tablets by mouth in the morning and at bedtime. [provider] Taking Active   Dulaglutide (TRULICITY) 3 MG/0.5ML SOPN [id number] No Inject 3 mg as directed once a week. [name], MD Taking Active   EPINEPHrine 0.3 mg/0.3 mL IJ SOAJ injection [id number] No Inject 0.3 mg into the muscle as needed for anaphylaxis. [name], MD Taking Active   estradiol [facility] VAGINAL) 0.1 MG/GM vaginal cream [id number] No Place 1 Applicatorful vaginally at bedtime. For 2 weeks then 3 times a week. [name], MD Taking Active   Insulin Pen Needle (TRUEPLUS PEN NEEDLES) 31G X 6 MM MISC [id number] No Use to inject insulin.  Patient not  taking: Reported on 12/17/2021   [name], MD Not Taking Active   INSULIN SYRINGE 1CC/29G 29G X 1/2" 1 ML MISC [id number] No Use up to TID for insulin. Ok to sub if needed as covered by insurance. [name], MD Taking Active   mupirocin ointment (BACTROBAN) 2 % [id number] No Apply topically 2 (two) times daily. [name], MD Taking Active   omeprazole (PRILOSEC) 20 MG capsule [id number] No Take 1 capsule (20 mg total) by mouth daily. [name], MD Taking Active   ondansetron (ZOFRAN-ODT) 8 MG disintegrating tablet [id number] No Take 1 tablet (8 mg total) by mouth every 8 (eight) hours as needed for nausea. Monica Becton, MD Taking Active   telmisartan (MICARDIS) 80 MG tablet [id number] No Take 1 tablet (80 mg total) by mouth daily. [name], MD Taking Active             Patient Active Problem List   Diagnosis Date Noted   Primary osteoarthritis of left shoulder 12/04/2021   History of herpes genitalis 05/11/2021   Vaginal atrophy 05/11/2021   Vaginal irritation 05/11/2021   Lumbar spinal stenosis with right-sided radiculitis 01/12/2021   Osteopenia 08/27/2020   Allergy to honey bee venom 08/04/2020   Memory impairment 12/06/2019   Trigger finger 08/07/2019   Primary insomnia 03/23/2018   Long-term use of high-risk medication 02/06/2018   Calcific Achilles tendinitis of left lower extremity 10/06/2017   Haglund's deformity of left heel 10/06/2017   Essential hypertension 02/01/2017   Rheumatoid arthritis involving multiple sites (HCC) 02/01/2017   Diabetes mellitus without complication (HCC) 02/01/2017   History of colon polyps 02/01/2017   Depression, major, single episode, complete remission (HCC) 02/01/2017   Hyperlipidemia 02/01/2017    Immunization History  Administered Date(s) Administered   Fluad Quad(high Dose 65+) 01/30/2021   Influenza,inj,Quad PF,6+ Mos 02/01/2017, 01/24/2018, 01/15/2019, 01/31/2020    Janssen (J&J) SARS-COV-2 Vaccination 07/21/2019   [name](Purple Top)SARS-COV-2 Vaccination 02/20/2020, 08/05/2020, 01/30/2021   PNEUMOCOCCAL CONJUGATE-20 03/30/2021   Pneumococcal Conjugate-13 03/06/2020   Tdap 07/01/2012    Conditions to be addressed/monitored: HTN, HLD, and DMII  Care  Plan : Medication Management  Updates made by [name], RPH since 01/01/2022 12:00 AM     Problem: HTN, DM, HLD      Long-Range Goal: Disease Progression Prevention   Start Date: 12/04/2020  Recent Progress: On track  Priority: High  Note:   Current Barriers:  None at present  Pharmacist Clinical Goal(s):  Over the next 60 days, patient will maintain control of chronic conditions as evidenced by medication fill history, lab values, and vital signs  through collaboration with PharmD and provider.   Interventions: 1:1 collaboration with [name], MD regarding development and update of comprehensive plan of care as evidenced by provider attestation and co-signature Inter-disciplinary care team collaboration (see longitudinal plan of care) Comprehensive medication review performed; medication list updated in electronic medical record  Diabetes:  Controlled; current treatment:Trulicity 3mg  weekly, insulin 75/25 injects 10 units in late afternoon;  Current glucose readings: fasting glucose: 130s, post prandial glucose: 170-190  Denies hypoglycemic/hyperglycemic symptoms  Current meal patterns: breakfast: egg, fruit, or 1/2 of mini bagel + fruit; lunch: protein shake, 1/2 tuna sandwich in pita pocket w/onion, fruit; dinner: chicken & veggies, meatloaf + yams, fish, spaghetti, sandwiches; snacks: string cheese, graham crackers, fruit, grapes, ice cream; drinks: milk and water   Current exercise: watches grandsons, [facility] [employer], aqua aerobics, walk, outside playing  Educated on mechanism of medications to explain why she is feeling full Recommended continue current regimen,  resubmitted request for DME to process CGM sensor RX. Hypertension:  Controlled; current treatment:telmisartan 80mg  daily;   Current home readings: 120s/70-80s,    Denies hypotensive/hypertensive symptoms  Recommended continue current regimen,  Hyperlipidemia:  Controlled; current treatment:atorvastatin 20mg  ; LDL 69  Recommended continue current regimen  Patient Goals/Self-Care Activities Over the next 30  days, patient will:  take medications as prescribed  Follow Up Plan: Telephone follow up appointment with care management team member scheduled for:  1 month       Medication Assistance: None required.  Patient affirms current coverage meets needs.  Patient's preferred pharmacy is:  [facility] [place], - [address] [place] [address] [place] [address] Phone: [phone] Fax: [phone]  [facility] [address], [phone] - [facility] [address] [place] [address] [place] [address] Phone: [phone] Fax: [phone]  [facility] [address] [phone] - [address] [place] Phone: [phone] Fax: [phone]   Uses pill box? Yes "Mediplanner" sets up for 3 weeks Pt endorses 100% compliance  Follow Up:  Patient agrees to Care Plan and Follow-up.  Plan: Telephone follow up appointment with care management team member scheduled for:  1 month  [id number], PharmD Clinical Pharmacist [facility] [phone]

## 2022-01-01 NOTE — Patient Instructions (Signed)
Visit Information  Thank you for taking time to visit with me today. Please don't hesitate to contact me if I can be of assistance to you before our next scheduled telephone appointment.  Following are the goals we discussed today:  Patient Goals/Self-Care Activities Over the next 30 days, patient will:  take medications as prescribed  Follow Up Plan: Telephone follow up appointment with care management team member scheduled for:  1 month  Please call the care guide team at [phone] if you need to cancel or reschedule your appointment.    Patient verbalizes understanding of instructions and care plan provided today and agrees to view in MyChart. Active MyChart status and patient understanding of how to access instructions and care plan via MyChart confirmed with patient.     Ann Torres J Ann Torres  

## 2022-01-09 ENCOUNTER — Other Ambulatory Visit: Payer: Self-pay

## 2022-01-09 ENCOUNTER — Ambulatory Visit
Admission: EM | Admit: 2022-01-09 | Discharge: 2022-01-09 | Disposition: A | Discharge status: Home / Self Care | Payer: Medicare Other | Attending: Family Medicine | Admitting: Family Medicine

## 2022-01-09 ENCOUNTER — Telehealth: Payer: Self-pay

## 2022-01-09 DIAGNOSIS — K5900 Constipation, unspecified: Secondary | ICD-10-CM | POA: Diagnosis not present

## 2022-01-09 DIAGNOSIS — M5440 Lumbago with sciatica, unspecified side: Secondary | ICD-10-CM | POA: Diagnosis not present

## 2022-01-09 DIAGNOSIS — M62830 Muscle spasm of back: Secondary | ICD-10-CM | POA: Diagnosis not present

## 2022-01-09 MED ORDER — METHOCARBAMOL 500 MG PO TABS: 500.0000 mg | ORAL_TABLET | Freq: Three times a day (TID) | ORAL | 0 refills | Status: DC | PRN

## 2022-01-09 MED ORDER — OXYCODONE-ACETAMINOPHEN 5-325 MG PO TABS: 1.0000 | ORAL_TABLET | Freq: Three times a day (TID) | ORAL | 0 refills | Status: DC | PRN

## 2022-01-09 MED ORDER — MAGNESIUM CITRATE PO SOLN
ORAL | 0 refills | Status: DC
Start: 2022-01-09 — End: 2022-02-22

## 2022-01-09 MED ORDER — PREDNISONE 10 MG (21) PO TBPK: ORAL_TABLET | Freq: Every day | ORAL | 0 refills | Status: DC

## 2022-01-09 NOTE — ED Triage Notes (Signed)
Pt c/o RT sided back pain since last night. Tender to touch. Hurts worse while breathing. No hx of kidney stones. Denis urinary sxs. Back surgery in Feb (L4). Taking tizanidine and naprosyn as well as Tylenol prn.

## 2022-01-09 NOTE — Telephone Encounter (Signed)
Pt called stating pharmacist told her mag citrate has been off the market for a year. Advised by Casimiro Needle that can be purchased OTC on [facility] or [facility]. Pt will try that, if not, alternative is Benefiber.

## 2022-01-09 NOTE — Discharge Instructions (Addendum)
Advised patient to take medication as directed with food to completion.  Advised may take Robaxin daily or as needed for accompanying muscle spasms of back.  Advised may take Percocet for breakthrough right sided lower back pain.  Advised patient to take mag citrate as directed and to increase daily fiber intake including increasing daily water intake to 65 to 75 ounces per day.  Advised if symptoms worsen and/or unresolved please follow-up with PCP or here for further evaluation.

## 2022-01-09 NOTE — ED Provider Notes (Signed)
Ann Torres CARE    CSN: [id number] Arrival date & time: 01/09/22  1321      History   Chief Complaint Chief Complaint  Patient presents with   Back Pain    HPI Ann Torres is a 66 y.o. female.   HPI Pleasant 66 year old female presents with right sided lower back pain.  Patient reports tender to touch, hurts worse while breathing, and denies history of history of kidney stones.  Denies urinary symptoms. Patient reports back surgery in February of this year on L4 MRI of lumbar spine without contrast performed on 10/22 revealed broad-based right foraminal disc also L4-L5 with moderate right L nerve level foraminal stenosis.  Mild multifocal spinal and bilateral foraminal stenosis at L3-L4.  Patient is accompanied by her daughter this afternoon.  History reviewed. No pertinent past medical history.  Patient Active Problem List   Diagnosis Date Noted   Primary osteoarthritis of left shoulder 12/04/2021   History of herpes genitalis 05/11/2021   Vaginal atrophy 05/11/2021   Vaginal irritation 05/11/2021   Lumbar spinal stenosis with right-sided radiculitis 01/12/2021   Osteopenia 08/27/2020   Allergy to honey bee venom 08/04/2020   Memory impairment 12/06/2019   Trigger finger 08/07/2019   Primary insomnia 03/23/2018   Long-term use of high-risk medication 02/06/2018   Calcific Achilles tendinitis of left lower extremity 10/06/2017   Haglund's deformity of left heel 10/06/2017   Essential hypertension 02/01/2017   Rheumatoid arthritis involving multiple sites ([place]) 02/01/2017   Diabetes mellitus without complication ([place]) [id number]   History of colon polyps 02/01/2017   Depression, major, single episode, complete remission ([place]) 02/01/2017   Hyperlipidemia 02/01/2017    Past Surgical History:  Procedure Laterality Date   BACK SURGERY     BILATERAL OOPHORECTOMY     BREAST BIOPSY     in pts 30s   BREAST CYST EXCISION     Pt unsure which breast & unable to find  a scar   CESAREAN SECTION     x 3   CHOLECYSTECTOMY     TOTAL ABDOMINAL HYSTERECTOMY     TUBAL LIGATION      OB History   No obstetric history on file.      Home Medications    Prior to Admission medications   Medication Sig Start Date End Date Taking? Authorizing Provider  Abatacept 125 MG/ML SOAJ Inject 1 Dose into the skin once a week. Sundays 01/12/18   [provider]  acetaminophen (TYLENOL) 650 MG CR tablet Take 2 tablets (1,300 mg total) by mouth in the morning and at bedtime. 12/04/21   Silverio Decamp, MD  albuterol (VENTOLIN HFA) 108 (90 Base) MCG/ACT inhaler Inhale 2 puffs into the lungs every 6 (six) hours as needed for wheezing or shortness of breath. 06/26/21   Hali Marry, MD  AMBULATORY NON FORMULARY MEDICATION Rolling walker, please take Rx to medical supply store. 03/18/21   Silverio Decamp, MD  aspirin EC 81 MG tablet Take 81 mg by mouth daily. Swallow whole.    [provider]  atorvastatin (LIPITOR) 20 MG tablet Take 1 tablet (20 mg total) by mouth at bedtime. 12/14/21   Hali Marry, MD  Calcium-Vitamin D-Vitamin K (VIACTIV CALCIUM PLUS D) 650-12.5-40 MG-MCG-MCG CHEW Chew 2 tablets by mouth in the morning and at bedtime.    [provider]  Dulaglutide (TRULICITY) 3 0000000 SOPN Inject 3 mg as directed once a week. 10/28/21   Hali Marry, MD  EPINEPHrine  0.3 mg/0.3 mL IJ SOAJ injection Inject 0.3 mg into the muscle as needed for anaphylaxis. 07/28/21   [name], MD  estradiol (ESTRACE VAGINAL) 0.1 MG/GM vaginal cream Place 1 Applicatorful vaginally at bedtime. For 2 weeks then 3 times a week. 07/28/21   [name], MD  Insulin Pen Needle (TRUEPLUS PEN NEEDLES) 31G X 6 MM MISC Use to inject insulin. Patient not taking: Reported on 12/17/2021 11/26/20   [name], MD  INSULIN SYRINGE 1CC/29G 29G X 1/2" 1 ML MISC Use up to TID for insulin. Ok to sub if needed as covered  by insurance. 03/03/21   [name], MD  magnesium citrate SOLN Take 150 mL p.o. twice daily x 3 days. 01/09/22  Yes Trevor Iha, FNP  methocarbamol (ROBAXIN) 500 MG tablet Take 1 tablet (500 mg total) by mouth 3 (three) times daily as needed for muscle spasms. 01/09/22  Yes Trevor Iha, FNP  mupirocin ointment (BACTROBAN) 2 % Apply topically 2 (two) times daily. 08/18/21   [name], MD  omeprazole (PRILOSEC) 20 MG capsule Take 1 capsule (20 mg total) by mouth daily. 12/14/21   [name], MD  ondansetron (ZOFRAN-ODT) 8 MG disintegrating tablet Take 1 tablet (8 mg total) by mouth every 8 (eight) hours as needed for nausea. 12/04/21   Monica Becton, MD  oxyCODONE-acetaminophen (PERCOCET/ROXICET) 5-325 MG tablet Take 1 tablet by mouth every 8 (eight) hours as needed for severe pain. 01/09/22  Yes Trevor Iha, FNP  predniSONE (STERAPRED UNI-PAK 21 TAB) 10 MG (21) TBPK tablet Take by mouth daily. Take 6 tabs by mouth daily  for 2 days, then 5 tabs for 2 days, then 4 tabs for 2 days, then 3 tabs for 2 days, 2 tabs for 2 days, then 1 tab by mouth daily for 2 days 01/09/22  Yes Trevor Iha, FNP  telmisartan (MICARDIS) 80 MG tablet Take 1 tablet (80 mg total) by mouth daily. 12/14/21   [name], MD    Family History Family History  Problem Relation Age of Onset   Colon cancer Mother    Heart attack Father 10    Social History Social History   Tobacco Use   Smoking status: Former    Types: Cigarettes    Quit date: 08/01/2017    Years since quitting: 4.4   Smokeless tobacco: Never  Substance Use Topics   Alcohol use: No   Drug use: No     Allergies   Bee venom, Lisinopril, Azathioprine, Celecoxib, Metformin and related, Red dye, Enbrel [etanercept], Farxiga [dapagliflozin], Hydroxychloroquine, Methotrexate derivatives, and Tape   Review of Systems Review of Systems  Musculoskeletal:  Positive for back pain.  All other systems reviewed  and are negative.    Physical Exam Triage Vital Signs ED Triage Vitals [01/09/22 1331]  Enc Vitals Group     BP (!) 170/96     Pulse Rate 95     Resp 18     Temp 98 F (36.7 C)     Temp Source Oral     SpO2 98 %     Weight      Height      Head Circumference      Peak Flow      Pain Score 10     Pain Loc      Pain Edu?      Excl. in GC?    No data found.  Updated Vital Signs BP (!) 170/96 (BP  Location: Right Arm)   Pulse 95   Temp 98 F (36.7 C) (Oral)   Resp 18   SpO2 98%    Physical Exam Vitals and nursing note reviewed.  Constitutional:      Appearance: Normal appearance. She is normal weight.  HENT:     Head: Normocephalic and atraumatic.     Mouth/Throat:     Mouth: Mucous membranes are moist.     Pharynx: Oropharynx is clear.  Eyes:     Extraocular Movements: Extraocular movements intact.     Conjunctiva/sclera: Conjunctivae normal.     Pupils: Pupils are equal, round, and reactive to light.  Cardiovascular:     Rate and Rhythm: Normal rate and regular rhythm.     Pulses: Normal pulses.     Heart sounds: Normal heart sounds. No murmur heard. Pulmonary:     Effort: Pulmonary effort is normal.     Breath sounds: Normal breath sounds. No wheezing, rhonchi or rales.  Musculoskeletal:     Cervical back: Normal range of motion and neck supple.     Comments: Left-sided lumbar sacral spine: TTP over superior lateral spinal erector groups, with palpable muscle adhesions noted; exam limited due to pain today  Neurological:     General: No focal deficit present.     Mental Status: She is alert and oriented to person, place, and time.      UC Treatments / Results  Labs (all labs ordered are listed, but only abnormal results are displayed) Labs Reviewed - No data to display  EKG   Radiology No results found.  Procedures Procedures (including critical care time)  Medications Ordered in UC Medications - No data to display  Initial Impression /  Assessment and Plan / UC Course  I have reviewed the triage vital signs and the nursing notes.  Pertinent labs & imaging results that were available during my care of the patient were reviewed by me and considered in my medical decision making (see chart for details).     Acute right-sided low back pain with sciatica-Rx'd Sterapred Unipak, oxycodone; 2. Muscle spasm of back; 3.  Constipation-Rx'd Mag citrate. Advised patient to take medication as directed with food to completion.  Advised may take Robaxin daily or as needed for accompanying muscle spasms of back.  Advised may take Percocet for breakthrough right sided lower back pain.  Advised patient to take mag citrate as directed and to increase daily fiber intake including increasing daily water intake to 65 to 75 ounces per day.  Advised if symptoms worsen and/or unresolved please follow-up with PCP or here for further evaluation.  Patient discharged home, hemodynamically stable. Final Clinical Impressions(s) / UC Diagnoses   Final diagnoses:  Acute right-sided low back pain with sciatica, sciatica laterality unspecified  Muscle spasm of back  Constipation, unspecified constipation type     Discharge Instructions      Advised patient to take medication as directed with food to completion.  Advised may take Robaxin daily or as needed for accompanying muscle spasms of back.  Advised may take Percocet for breakthrough right sided lower back pain.  Advised patient to take mag citrate as directed and to increase daily fiber intake including increasing daily water intake to 65 to 75 ounces per day.  Advised if symptoms worsen and/or unresolved please follow-up with PCP or here for further evaluation.     ED Prescriptions     Medication Sig Dispense Auth. Provider   oxyCODONE-acetaminophen (PERCOCET/ROXICET) 5-325 MG tablet  Take 1 tablet by mouth every 8 (eight) hours as needed for severe pain. 21 tablet Eliezer Lofts, FNP   predniSONE  (STERAPRED UNI-PAK 21 TAB) 10 MG (21) TBPK tablet Take by mouth daily. Take 6 tabs by mouth daily  for 2 days, then 5 tabs for 2 days, then 4 tabs for 2 days, then 3 tabs for 2 days, 2 tabs for 2 days, then 1 tab by mouth daily for 2 days 42 tablet Eliezer Lofts, FNP   methocarbamol (ROBAXIN) 500 MG tablet Take 1 tablet (500 mg total) by mouth 3 (three) times daily as needed for muscle spasms. 30 tablet Eliezer Lofts, FNP   magnesium citrate SOLN Take 150 mL p.o. twice daily x 3 days. 900 mL Eliezer Lofts, FNP      I have reviewed the PDMP during this encounter.   Eliezer Lofts, [place] 01/09/22 1444

## 2022-01-10 ENCOUNTER — Telehealth: Payer: Self-pay

## 2022-01-10 NOTE — Telephone Encounter (Signed)
Follow up call:  Pt states that he pain level has decreased to a 7 which she states is great for her.  Pt states that she had no issues with picking up her medications.  Pt states that she is taking her medications. Pt states that she doesn't have any questions or concerns.  Pt was instructed to give office a call if anything changes. LM

## 2022-01-15 ENCOUNTER — Ambulatory Visit: Payer: Medicare Other | Admitting: Sports Medicine

## 2022-01-21 DIAGNOSIS — E119 Type 2 diabetes mellitus without complications: Secondary | ICD-10-CM | POA: Diagnosis not present

## 2022-02-02 ENCOUNTER — Encounter: Payer: Self-pay | Admitting: Family Medicine

## 2022-02-02 ENCOUNTER — Telehealth: Payer: Medicare Other

## 2022-02-04 ENCOUNTER — Ambulatory Visit (INDEPENDENT_AMBULATORY_CARE_PROVIDER_SITE_OTHER): Payer: Medicare Other | Admitting: Pharmacist

## 2022-02-04 DIAGNOSIS — I10 Essential (primary) hypertension: Secondary | ICD-10-CM

## 2022-02-04 DIAGNOSIS — E785 Hyperlipidemia, unspecified: Secondary | ICD-10-CM

## 2022-02-04 DIAGNOSIS — E119 Type 2 diabetes mellitus without complications: Secondary | ICD-10-CM

## 2022-02-04 MED ORDER — HUMALOG MIX 75/25 (75-25) 100 UNIT/ML ~~LOC~~ SUSP: SUBCUTANEOUS | 5 refills | Status: DC

## 2022-02-04 MED ORDER — INSULIN LISPRO 100 UNIT/ML IJ SOLN: 5.0000 [IU] | Freq: Three times a day (TID) | INTRAMUSCULAR | 5 refills | Status: DC

## 2022-02-04 NOTE — Progress Notes (Signed)
Chronic Care Management Pharmacy Note  02/04/2022 Name:  Ann Torres MRN:  [id number] DOB:  January 29, 1956  Summary: addressed HTN, HLD, DM.   She states the first few days of trulicity are good with blood sugar readings but then seems to wear off.   She is currently using insulin lispro after meals - about 3 units after lunch for BG ~180s, and about 6 units to cover dinner.   BG 79 this morning.  She uses [place] 2 currently, obtained through DME. We applied a Libre 3 sensor as a sample of the newer model.  She has increased to trulicity 3mg  weekly dose thus far, and uses zofran to get through the increased dose adjustment periods.   Recommendations/Changes made from today's visit:  - She will download the [place] 2 phone app instead of using the reader device. - Counseled patient on benefits of GLP1 therapy aside from BG control and the reasoning behind titration to max tolerated dosing, which she verbalized understanding and appreciation, will discuss ongoing with PCP at upcoming visit. - No changes to medication, we discussed that her use of insulin lispro is the appropriate choice of insulin (short acting, not mix) for right now while aiming to titrate trulicity. Advised her for dosing, to utilize 5-10 units TID for meals.  - She does NOT need any RX for right now, just filled lispro at the end of September and this will be enough to get her through [place] trip and to see PCP on 02/22/22.  Plan: f/u with pharmacist in 4 weeks  Subjective: Ann Torres is a 66 y.o. year old female who is a primary patient of Torres, Rene Kocher, MD.  The CCM team was consulted for assistance with disease management and care coordination needs.    Engaged with patient by telephone for follow up visit in response to provider referral for pharmacy case management and/or care coordination services.   Consent to Services:  The patient was given information about Chronic Care Management services,  agreed to services, and gave verbal consent prior to initiation of services.  Please see initial visit note for detailed documentation.   Patient Care Team: Hali Marry, MD as PCP - General (Family Medicine) Darius Bump, [facility] as Pharmacist (Pharmacist)  Recent office visits:  11/26/20-Ann Metheney, MD (PCP) Seen for Diabetes Mellitus.Labs ordered. Follow up in 4 months. 08/04/20-Ann Metheney, MD (PCP) General follow up. Start on EPINEPHrine 0.3 mg/0.3 mL IJ SOAJ injection as needed for honey bee venom allergy. Bone density ordered. Follow up in 3 months. Recent consult visits:  None noted   Hospital visits:  None in previous 6 months  Objective:  Lab Results  Component Value Date   CREATININE 0.71 10/30/2021   CREATININE 0.71 11/26/2020   CREATININE 0.70 08/04/2020    Lab Results  Component Value Date   HGBA1C 6.2 (A) 10/28/2021   Last diabetic Eye exam:  Lab Results  Component Value Date/Time   HMDIABEYEEXA No Retinopathy 03/12/2020 12:00 AM    Last diabetic Foot exam: No results found for: "HMDIABFOOTEX"      Component Value Date/Time   CHOL 108 10/30/2021 0000   CHOL 174 08/23/2018 1006   TRIG 128 10/30/2021 0000   HDL 46 (L) 10/30/2021 0000   HDL 43 08/23/2018 1006   CHOLHDL 2.3 10/30/2021 0000   LDLCALC 41 10/30/2021 0000       Latest Ref Rng & Units 10/30/2021   12:00 AM 11/26/2020   12:00 AM 12/07/2019  8:08 AM  Hepatic Function  Total Protein 6.1 - 8.1 g/dL 6.8  7.0  6.6   AST 10 - 35 U/L 19  15  14    ALT 6 - 29 U/L 18  14  13    Total Bilirubin 0.2 - 1.2 mg/dL 0.6  0.5  0.6     Lab Results  Component Value Date/Time   TSH 0.76 10/30/2021 12:00 AM   TSH 0.86 12/07/2019 08:08 AM       Latest Ref Rng & Units 10/30/2021   12:00 AM 12/07/2019    8:08 AM 03/14/2019    8:32 AM  CBC  WBC 3.8 - 10.8 Thousand/uL 7.8  8.1    Hemoglobin 11.7 - 15.5 g/dL 13.1  13.6  13.4   Hematocrit 35.0 - 45.0 % 39.4  41.2  40.4   Platelets 140  - 400 Thousand/uL 239  228       Social History   Tobacco Use  Smoking Status Former   Types: Cigarettes   Quit date: 08/01/2017   Years since quitting: 4.5  Smokeless Tobacco Never   BP Readings from Last 3 Encounters:  01/09/22 (!) 170/96  10/28/21 (!) 160/69  10/25/21 (!) 143/84   Pulse Readings from Last 3 Encounters:  01/09/22 95  10/28/21 79  10/25/21 79   Wt Readings from Last 3 Encounters:  10/28/21 162 lb (73.5 kg)  10/25/21 160 lb (72.6 kg)  08/18/21 165 lb (74.8 kg)    Assessment: Review of patient past medical history, allergies, medications, health status, including review of consultants reports, laboratory and other test data, was performed as part of comprehensive evaluation and provision of chronic care management services.   SDOH:  (Social Determinants of Health) assessments and interventions performed:  SDOH Interventions    Flowsheet Row Office Visit from 03/30/2021 in [facility] Office Visit from 11/26/2020 in [facility] Office Visit from 05/18/2018 in [facility] [place] Office Visit from 03/23/2018 in [facility] [facility] Interventions      Depression Interventions/Treatment  PHQ2-9 Score <4 Follow-up Not Indicated PHQ2-9 Score <4 Follow-up Not Indicated Currently on Treatment Counseling  [pt would like to restart celexa]       CCM Care Plan  Allergies  Allergen Reactions   Bee Venom Anaphylaxis and Hives   Lisinopril Shortness Of Breath   Azathioprine Nausea And Vomiting   Celecoxib Other (See Comments)    Patient reported   Metformin And Related Other (See Comments)    Diarrhea and vomiting   Red Dye Other (See Comments)   Enbrel [Etanercept] Other (See Comments)    ENT felt was causing hearing loss   [place] [Dapagliflozin] Other (See Comments)    Recurrent yeast infections   Hydroxychloroquine Rash    Methotrexate Derivatives Rash   Tape Rash    r    Medications Reviewed Today     Reviewed by Elisha Headland, CMA (Certified Medical Assistant) on 01/09/22 at 1334  Med List Status: <None>   Medication Order Taking? Sig Documenting Provider Last Dose Status Informant  Abatacept 125 MG/ML SOAJ [id number]  Inject 1 Dose into the skin once a week. Sundays [provider]  Active   acetaminophen (TYLENOL) 650 MG CR tablet [id number]  Take 2 tablets (1,300 mg total) by mouth in the morning and at bedtime. Silverio Decamp, MD  Active   albuterol (VENTOLIN HFA)  108 (90 Base) MCG/ACT inhaler [id number]  Inhale 2 puffs into the lungs every 6 (six) hours as needed for wheezing or shortness of breath. Hali Marry, MD  Active   Camillo Flaming MEDICATION [id number]  Rolling walker, please take Rx to medical supply store. Silverio Decamp, MD  Active   aspirin EC 81 MG tablet [id number]  Take 81 mg by mouth daily. Swallow whole. [provider]  Active            Med Note Tonny Bollman Dec 17, 2021  9:23 AM) Switched to every other day per cardiology as of 12/17/21 review  atorvastatin (LIPITOR) 20 MG tablet [id number]  Take 1 tablet (20 mg total) by mouth at bedtime. Hali Marry, MD  Active   Calcium-Vitamin D-Vitamin K Hinton Rao CALCIUM PLUS D) 650-12.5-40 MG-MCG-MCG CHEW [id number]  Chew 2 tablets by mouth in the morning and at bedtime. [provider]  Active   Dulaglutide (TRULICITY) 3 YH/0.6CB SOPN [id number]  Inject 3 mg as directed once a week. Hali Marry, MD  Active   EPINEPHrine 0.3 mg/0.3 mL IJ SOAJ injection [id number]  Inject 0.3 mg into the muscle as needed for anaphylaxis. Hali Marry, MD  Active   estradiol [facility] VAGINAL) 0.1 MG/GM vaginal cream [id number]  Place 1 Applicatorful vaginally at bedtime. For 2 weeks then 3 times a week. Hali Marry, MD  Active   Insulin Pen Needle (TRUEPLUS PEN  NEEDLES) 31G X 6 MM MISC [id number]  Use to inject insulin.  Patient not taking: Reported on 12/17/2021   Hali Marry, MD  Active   INSULIN SYRINGE 1CC/29G 29G X 1/2" 1 ML MISC [id number]  Use up to TID for insulin. Ok to sub if needed as covered by insurance. Hali Marry, MD  Active   mupirocin ointment (BACTROBAN) 2 % [id number]  Apply topically 2 (two) times daily. Hali Marry, MD  Active   omeprazole (PRILOSEC) 20 MG capsule [id number]  Take 1 capsule (20 mg total) by mouth daily. Hali Marry, MD  Active   ondansetron (ZOFRAN-ODT) 8 MG disintegrating tablet [id number]  Take 1 tablet (8 mg total) by mouth every 8 (eight) hours as needed for nausea. Silverio Decamp, MD  Active   telmisartan (MICARDIS) 80 MG tablet [id number]  Take 1 tablet (80 mg total) by mouth daily. Hali Marry, MD  Active             Patient Active Problem List   Diagnosis Date Noted   Primary osteoarthritis of left shoulder 12/04/2021   History of herpes genitalis 05/11/2021   Vaginal atrophy 05/11/2021   Vaginal irritation 05/11/2021   Lumbar spinal stenosis with right-sided radiculitis 01/12/2021   Osteopenia 08/27/2020   Allergy to honey bee venom 08/04/2020   Memory impairment 12/06/2019   Trigger finger 08/07/2019   Primary insomnia 03/23/2018   Long-term use of high-risk medication 02/06/2018   Calcific Achilles tendinitis of left lower extremity 10/06/2017   Haglund's deformity of left heel 10/06/2017   Essential hypertension 02/01/2017   Rheumatoid arthritis involving multiple sites ([place]) 02/01/2017   Diabetes mellitus without complication ([place]) 40/12/6759   History of colon polyps 02/01/2017   Depression, major, single episode, complete remission ([place]) 02/01/2017   Hyperlipidemia 02/01/2017    Immunization History  Administered Date(s) Administered   Fluad Quad(high Dose 65+) 01/30/2021   Influenza,inj,Quad PF,6+ Mos 02/01/2017, 01/24/2018,  01/15/2019, 01/31/2020   Janssen (J&J) SARS-COV-2 Vaccination  07/21/2019   [name](Purple Top)SARS-COV-2 Vaccination 02/20/2020, 08/05/2020, 01/30/2021   PNEUMOCOCCAL CONJUGATE-20 03/30/2021   Pneumococcal Conjugate-13 03/06/2020   Tdap 07/01/2012    Conditions to be addressed/monitored: HTN, HLD, and DMII  There are no care plans that you recently modified to display for this patient.      Medication Assistance: None required.  Patient affirms current coverage meets needs.  Patient's preferred pharmacy is:  [facility], [address] [place] [address] Phone: [phone] Fax: [phone]  [place], [place] - [id number] [place] [address] Phone: [phone] Fax: [phone]  [facility] [address], [place] - [place] [address] Phone: [phone] Fax: [phone]   Uses pill box? Yes "Mediplanner" sets up for 3 weeks Pt endorses 100% compliance  Follow Up:  Patient agrees to Care Plan and Follow-up.  Plan: Telephone follow up appointment with care management team member scheduled for:  1 month  Larinda Buttery, PharmD Clinical Pharmacist [facility] [phone]

## 2022-02-04 NOTE — Addendum Note (Signed)
Addended by: Beatrice Lecher D on: 02/04/2022 10:35 AM   Modules accepted: Orders

## 2022-02-04 NOTE — Patient Instructions (Signed)
Tamir[place],  Enjoyed our phone call today!   As a reminder: - Download the "Libre 2" app on your phone for convenience instead of the reader device (no rush!) - Use insulin Lispro, 5-10 units three times a day for meal coverage - Plan to discuss trulicity 4.5mg  dose increase at upcoming appointment with Dr. Madilyn Fireman.   We will reconnect after that visit and make sure  you are supported through any medication changes!  Thanks, Lysle Morales

## 2022-02-09 ENCOUNTER — Telehealth: Payer: Medicare Other | Admitting: Family Medicine

## 2022-02-09 DIAGNOSIS — U071 COVID-19: Secondary | ICD-10-CM

## 2022-02-09 NOTE — Progress Notes (Signed)
[place]  In [place], needs to go to local Urgent Care to be seen for COVID  Patient acknowledged agreement and understanding of the plan.

## 2022-02-21 DIAGNOSIS — E119 Type 2 diabetes mellitus without complications: Secondary | ICD-10-CM | POA: Diagnosis not present

## 2022-02-22 ENCOUNTER — Ambulatory Visit (INDEPENDENT_AMBULATORY_CARE_PROVIDER_SITE_OTHER): Payer: Medicare Other | Admitting: Family Medicine

## 2022-02-22 ENCOUNTER — Telehealth: Payer: Self-pay | Admitting: Family Medicine

## 2022-02-22 ENCOUNTER — Encounter: Payer: Self-pay | Admitting: Family Medicine

## 2022-02-22 ENCOUNTER — Other Ambulatory Visit: Payer: Self-pay | Admitting: Family Medicine

## 2022-02-22 VITALS — BP 150/75 | HR 76 | Ht 64.0 in | Wt 157.0 lb

## 2022-02-22 DIAGNOSIS — I10 Essential (primary) hypertension: Secondary | ICD-10-CM | POA: Diagnosis not present

## 2022-02-22 DIAGNOSIS — E119 Type 2 diabetes mellitus without complications: Secondary | ICD-10-CM

## 2022-02-22 DIAGNOSIS — Z1231 Encounter for screening mammogram for malignant neoplasm of breast: Secondary | ICD-10-CM

## 2022-02-22 LAB — POCT GLYCOSYLATED HEMOGLOBIN (HGB A1C): Hemoglobin A1C: 7.2 % — AB (ref 4.0–5.6)

## 2022-02-22 MED ORDER — TRULICITY 4.5 MG/0.5ML ~~LOC~~ SOAJ: 4.5000 mg | SUBCUTANEOUS | 1 refills | Status: DC

## 2022-02-22 NOTE — Assessment & Plan Note (Signed)
Blood pressure is elevated again today.  Would like to consider adding HCTZ to her 10 losartan which she is currently taking.

## 2022-02-22 NOTE — Telephone Encounter (Signed)
Please call patient and let her know that blood pressure was still elevated and has been elevated the last couple of time she has been seen and like to adjust her time losartan to 10 losartan/HCT combo.  That way it still 1 pill 1 time a day but will help lower her blood pressure further.  See if she is okay with this change.

## 2022-02-22 NOTE — Progress Notes (Signed)
Established Patient Office Visit  Subjective   Patient ID: Ann Torres, female    DOB: [birth year]  Age: 66 y.o. MRN: [id number]  Chief Complaint  Patient presents with   Diabetes    HPI  Diabetes - no hypoglycemic events. No wounds or sores that are not healing well. No increased thirst or urination. Checking glucose at home. Taking medications as prescribed without any side effects.  She did have significant nausea with the Trulicity 3 mg when she first started it she had to had to use some Zofran as needed.  She has been on that dose for at least 3 months and she is a little bit nervous about going up.  She just feels like her blood sugars have been a little bit all over the place.  She has been dosing her short acting insulin post meals a couple hours after she eats when she has a spike and she will give anywhere from 3 to 4 units.  Hypertension- Pt denies chest pain, SOB, dizziness, or heart palpitations.  Taking meds as directed w/o problems.  Denies medication side effects.       ROS    Objective:     BP (!) 150/75   Pulse 76   Ht 5\' 4"  (1.626 m)   Wt 157 lb (71.2 kg)   SpO2 99%   BMI 26.95 kg/m    Physical Exam Vitals and nursing note reviewed.  Constitutional:      Appearance: She is well-developed.  HENT:     Head: Normocephalic and atraumatic.  Cardiovascular:     Rate and Rhythm: Normal rate and regular rhythm.     Heart sounds: Normal heart sounds.  Pulmonary:     Effort: Pulmonary effort is normal.     Breath sounds: Normal breath sounds.  Skin:    General: Skin is warm and dry.  Neurological:     Mental Status: She is alert and oriented to person, place, and time.  Psychiatric:        Behavior: Behavior normal.      Results for orders placed or performed in visit on 02/22/22  POCT glycosylated hemoglobin (Hb A1C)  Result Value Ref Range   Hemoglobin A1C 7.2 (A) 4.0 - 5.6 %   HbA1c POC (<> result, manual entry)     HbA1c, POC (prediabetic  range)     HbA1c, POC (controlled diabetic range)        The ASCVD Risk score (Arnett DK, et al., 2019) failed to calculate for the following reasons:   The valid total cholesterol range is 130 to 320 mg/dL   Unable to determine if patient is Non-Hispanic African American    Assessment & Plan:   Problem List Items Addressed This Visit       Cardiovascular and Mediastinum   Essential hypertension    Blood pressure is elevated again today.  Would like to consider adding HCTZ to her 10 losartan which she is currently taking.        Endocrine   Diabetes mellitus without complication ([place]) - Primary    Thus dosing her insulin closer to mealtime may have even right after since she is on a GLP-1 it theoretically should delay that glucose peak after eating so if she doses it after eating then it should be more accurate so that the peak of the insulin hits the peak of the glucose after meal.  She is also had of exposure to a lot of prednisone lately  and so that has been affecting her blood sugars as well.  A1c slightly elevated today at 7.2.  Continue to work on [employer] and regular exercise.  We also discussed eating more small meals instead of skipping meals.  And try to work on getting adequate protein intake with each meal.      Relevant Medications   Dulaglutide (TRULICITY) 4.5 MG/0.5ML SOPN   Other Relevant Orders   POCT glycosylated hemoglobin (Hb A1C) (Completed)    Return in about 4 weeks (around 03/22/2022) for Diabetes follow-up.    Nani Gasser, MD

## 2022-02-22 NOTE — Assessment & Plan Note (Signed)
Thus dosing her insulin closer to mealtime may have even right after since she is on a GLP-1 it theoretically should delay that glucose peak after eating so if she doses it after eating then it should be more accurate so that the peak of the insulin hits the peak of the glucose after meal.  She is also had of exposure to a lot of prednisone lately and so that has been affecting her blood sugars as well.  A1c slightly elevated today at 7.2.  Continue to work on [employer] and regular exercise.  We also discussed eating more small meals instead of skipping meals.  And try to work on getting adequate protein intake with each meal.

## 2022-02-23 NOTE — Telephone Encounter (Signed)
Left message for patient to return call.

## 2022-02-26 MED ORDER — TELMISARTAN-HCTZ 80-12.5 MG PO TABS: 1.0000 | ORAL_TABLET | Freq: Every day | ORAL | 0 refills | Status: DC

## 2022-02-26 NOTE — Telephone Encounter (Signed)
Called pt and advised her of change. She asked if she would continue taking the Telmisartan 80 mg. I advised her that I would need to fwd this to Dr. Madilyn Fireman for advice and call her back.

## 2022-02-26 NOTE — Telephone Encounter (Signed)
It will be telmisartan with hctz. Sorry for the typo below. Yes, she will stop her current dose of telmisartan.

## 2022-02-26 NOTE — Telephone Encounter (Signed)
Pt advised.

## 2022-03-03 ENCOUNTER — Ambulatory Visit (INDEPENDENT_AMBULATORY_CARE_PROVIDER_SITE_OTHER): Payer: Medicare Other

## 2022-03-03 DIAGNOSIS — Z1231 Encounter for screening mammogram for malignant neoplasm of breast: Secondary | ICD-10-CM

## 2022-03-05 NOTE — Progress Notes (Signed)
Please call patient. Normal mammogram.  Repeat in 1 year.  

## 2022-03-19 ENCOUNTER — Ambulatory Visit (INDEPENDENT_AMBULATORY_CARE_PROVIDER_SITE_OTHER): Payer: Medicare Other | Admitting: Family Medicine

## 2022-03-19 DIAGNOSIS — Z0000 Encounter for general adult medical examination without abnormal findings: Secondary | ICD-10-CM | POA: Diagnosis not present

## 2022-03-19 NOTE — Progress Notes (Signed)
MEDICARE ANNUAL WELLNESS VISIT  03/19/2022  Telephone Visit Disclaimer This Medicare AWV was conducted by telephone due to national recommendations for restrictions regarding the COVID-19 Pandemic (e.g. social distancing).  I verified, using two identifiers, that I am speaking with Ann Torres or their authorized healthcare agent. I discussed the limitations, risks, security, and privacy concerns of performing an evaluation and management service by telephone and the potential availability of an in-person appointment in the future. The patient expressed understanding and agreed to proceed.  Location of Patient: Home Location of Provider (nurse):  In the office.  Subjective:    Ann Torres is a 66 y.o. female patient of Metheney, Barbarann Ehlers, MD who had a Medicare Annual Wellness Visit today via telephone. [name] is Retired and lives alone. she has 3 children. she reports that she is socially active and does interact with friends/family regularly. she is minimally physically active and enjoys playing tennis, sewing, painting stones and water aerobics. .  Patient Care Team: [name], MD as PCP - General (Family Medicine) [name], [facility] as Pharmacist (Pharmacist)     03/19/2022   10:21 AM 08/26/2021    8:55 AM 01/14/2021    9:40 AM 01/10/2021    8:12 AM 03/17/2020    2:36 PM 02/01/2017    1:40 PM  Advanced Directives  Does Patient Have a Medical Advance Directive? Yes Yes No No No No  Type of Advance Directive Living will Healthcare Power of [place];Living will      Does patient want to make changes to medical advance directive? No - Patient declined No - Patient declined      Copy of Healthcare Power of Attorney in Chart?  No - copy requested      Would patient like information on creating a medical advance directive?   No - Patient declined No - Patient declined Yes (ED - Information included in AVS) No - Patient declined    Hospital Utilization Over the  Past 12 Months: # of hospitalizations or ER visits: 0 # of surgeries: 0  Review of Systems    Patient reports that her overall health is better compared to last year.  History obtained from chart review and the patient  Patient Reported Readings (BP, Pulse, CBG, Weight, etc) none  Pain Assessment Pain : 0-10 Pain Score: 10-Worst pain ever Pain Type: Acute pain Pain Location: Shoulder Pain Orientation: Left Pain Descriptors / Indicators: Constant, Aching Pain Onset: More than a month ago Pain Frequency: Constant Pain Relieving Factors: none  Pain Relieving Factors: none  Current Medications & Allergies (verified) Allergies as of 03/19/2022       Reactions   Bee Venom Anaphylaxis, Hives   Lisinopril Shortness Of Breath   Azathioprine Nausea And Vomiting   Celecoxib Other (See Comments)   Patient reported   Metformin And Related Other (See Comments)   Diarrhea and vomiting   Red Dye Other (See Comments)   Enbrel [etanercept] Other (See Comments)   ENT felt was causing hearing loss   Farxiga [dapagliflozin] Other (See Comments)   Recurrent yeast infections   Hydroxychloroquine Rash   Methotrexate Derivatives Rash   Tape Rash   r        Medication List        Accurate as of March 19, 2022 10:31 AM. If you have any questions, ask your nurse or doctor.          Abatacept 125 MG/ML Soaj Inject 1 Dose into  the skin once a week. Sundays   acetaminophen 650 MG CR tablet Commonly known as: TYLENOL Take 2 tablets (1,300 mg total) by mouth in the morning and at bedtime.   albuterol 108 (90 Base) MCG/ACT inhaler Commonly known as: VENTOLIN HFA Inhale 2 puffs into the lungs every 6 (six) hours as needed for wheezing or shortness of breath.   AMBULATORY NON FORMULARY MEDICATION Rolling walker, please take Rx to medical supply store.   aspirin EC 81 MG tablet Take 81 mg by mouth daily. Swallow whole.   atorvastatin 20 MG tablet Commonly known as:  LIPITOR Take 1 tablet (20 mg total) by mouth at bedtime.   EPINEPHrine 0.3 mg/0.3 mL Soaj injection Commonly known as: EPI-PEN Inject 0.3 mg into the muscle as needed for anaphylaxis.   estradiol 0.1 MG/GM vaginal cream Commonly known as: ESTRACE VAGINAL Place 1 Applicatorful vaginally at bedtime. For 2 weeks then 3 times a week.   insulin lispro 100 UNIT/ML injection Commonly known as: HumaLOG Inject 0.05-0.1 mLs (5-10 Units total) into the skin 3 (three) times daily before meals.   INSULIN SYRINGE 1CC/29G 29G X 1/2" 1 ML Misc Use up to TID for insulin. Ok to sub if needed as covered by insurance.   omeprazole 20 MG capsule Commonly known as: PRILOSEC Take 1 capsule (20 mg total) by mouth daily.   ondansetron 8 MG disintegrating tablet Commonly known as: ZOFRAN-ODT Take 1 tablet (8 mg total) by mouth every 8 (eight) hours as needed for nausea.   oxyCODONE-acetaminophen 5-325 MG tablet Commonly known as: PERCOCET/ROXICET Take 1 tablet by mouth every 8 (eight) hours as needed for severe pain.   telmisartan-hydrochlorothiazide 80-12.5 MG tablet Commonly known as: MICARDIS HCT Take 1 tablet by mouth daily.   Trulicity 4.5 MG/0.5ML Sopn Generic drug: Dulaglutide Inject 4.5 mg as directed once a week.   Viactiv Calcium Plus D 650-12.5-40 MG-MCG-MCG Chew Generic drug: Calcium-Vitamin D-Vitamin K Chew 2 tablets by mouth in the morning and at bedtime.        History (reviewed): History reviewed. No pertinent past medical history. Past Surgical History:  Procedure Laterality Date   BACK SURGERY     BILATERAL OOPHORECTOMY     BREAST BIOPSY     in pts 30s   BREAST CYST EXCISION     Pt unsure which breast & unable to find a scar   CESAREAN SECTION     x 3   CHOLECYSTECTOMY     TOTAL ABDOMINAL HYSTERECTOMY     TUBAL LIGATION     Family History  Problem Relation Age of Onset   Colon cancer Mother    Heart attack Father 4   Social History   Socioeconomic History    Marital status: Divorced    Spouse name: Not on file   Number of children: 3   Years of education: 2 yr college   Highest education level: Some college, no degree  Occupational History   Occupation: retired  Tobacco Use   Smoking status: Former    Types: Cigarettes    Quit date: 08/01/2017    Years since quitting: 4.6   Smokeless tobacco: Never  Substance and Sexual Activity   Alcohol use: No   Drug use: No   Sexual activity: Never  Other Topics Concern   Not on file  Social History Narrative   Lives alone. She has three children. She enjoys playing tennis, sewing, painting stones and water aerobics.    Social Determinants of Health  Financial Resource Strain: Low Risk  (03/19/2022)   Overall Financial Resource Strain (CARDIA)    Difficulty of Paying Living Expenses: Not hard at all  Food Insecurity: No Food Insecurity (03/19/2022)   Hunger Vital Sign    Worried About Running Out of Food in the Last Year: Never true    Ran Out of Food in the Last Year: Never true  Transportation Needs: No Transportation Needs (03/19/2022)   PRAPARE - Administrator, Civil Service (Medical): No    Lack of Transportation (Non-Medical): No  Physical Activity: Inactive (03/19/2022)   Exercise Vital Sign    Days of Exercise per Week: 0 days    Minutes of Exercise per Session: 0 min  Stress: No Stress Concern Present (03/19/2022)   [employer] of Occupational Health - Occupational Stress Questionnaire    Feeling of Stress : Not at all  Social Connections: Socially Isolated (03/19/2022)   Social Connection and Isolation Panel [NHANES]    Frequency of Communication with Friends and Family: More than three times a week    Frequency of Social Gatherings with Friends and Family: Twice a week    Attends Religious Services: Never    Database administrator or Organizations: No    Attends Banker Meetings: Never    Marital Status: Divorced    Activities of Daily  Living    03/19/2022   10:23 AM  In your present state of health, do you have any difficulty performing the following activities:  Hearing? 1  Comment some hearing loss  Vision? 0  Difficulty concentrating or making decisions? 0  Walking or climbing stairs? 1  Comment difficulty with stairs  Dressing or bathing? 0  Doing errands, shopping? 0  Preparing Food and eating ? N  Using the Toilet? N  In the past six months, have you accidently leaked urine? N  Do you have problems with loss of bowel control? N  Managing your Medications? N  Managing your Finances? N  Housekeeping or managing your Housekeeping? N    Patient Education/ Literacy How often do you need to have someone help you when you read instructions, pamphlets, or other written materials from your doctor or pharmacy?: 1 - Never What is the last grade level you completed in school?: 2 years of college  Exercise Current Exercise Habits: Home exercise routine, Type of exercise: walking, Time (Minutes): 20, Frequency (Times/Week): 7, Weekly Exercise (Minutes/Week): 140, Exercise limited by: orthopedic condition(s)  Diet Patient reports consuming 2 meals a day and 2 snack(s) a day Patient reports that her primary diet is: Regular Patient reports that she does have regular access to food.   Depression Screen    03/19/2022   10:16 AM 02/22/2022    3:11 PM 08/18/2021    3:22 PM 03/30/2021    8:27 AM 01/10/2021    8:12 AM 11/26/2020    8:25 AM 08/04/2020    8:18 AM  PHQ 2/9 Scores  PHQ - 2 Score 2 0 0 2 0 0 0  PHQ- 9 Score 2 0  9  1 0     Fall Risk    03/19/2022   10:16 AM 02/22/2022    3:11 PM 10/28/2021   10:34 AM 08/18/2021    3:22 PM 03/30/2021    8:21 AM  Fall Risk   Falls in the past year? 0 0 0 0 1  Number falls in past yr: 0 0 0 0 1  Injury with Fall? 0  0 0 0 0  Risk for fall due to : No Fall Risks No Fall Risks No Fall Risks No Fall Risks Impaired mobility  Follow up Falls evaluation completed Falls  evaluation completed Falls prevention discussed;Falls evaluation completed Falls prevention discussed;Falls evaluation completed Falls prevention discussed;Falls evaluation completed     Objective:  JEENA ARNETT seemed alert and oriented and she participated appropriately during our telephone visit.  Blood Pressure Weight BMI  BP Readings from Last 3 Encounters:  02/22/22 (!) 150/75  01/09/22 (!) 170/96  10/28/21 (!) 160/69   Wt Readings from Last 3 Encounters:  02/22/22 157 lb (71.2 kg)  10/28/21 162 lb (73.5 kg)  10/25/21 160 lb (72.6 kg)   BMI Readings from Last 1 Encounters:  02/22/22 26.95 kg/m    *Unable to obtain current vital signs, weight, and BMI due to telephone visit type  Hearing/Vision  Adelfa did not seem to have difficulty with hearing/understanding during the telephone conversation Reports that she has had a formal eye exam by an eye care professional within the past year Reports that she has had a formal hearing evaluation within the past year *Unable to fully assess hearing and vision during telephone visit type  Cognitive Function:    03/19/2022   10:26 AM 01/10/2021    8:16 AM  6CIT Screen  What Year? 0 points 0 points  What month? 0 points 0 points  What time? 0 points 0 points  Count back from 20 0 points 0 points  Months in reverse 0 points 0 points  Repeat phrase 0 points 0 points  Total Score 0 points 0 points   (Normal:0-7, Significant for Dysfunction: >8)  Normal Cognitive Function Screening: Yes   Immunization & Health Maintenance Record Immunization History  Administered Date(s) Administered   Fluad Quad(high Dose 65+) 01/30/2021, 01/26/2022   Influenza,inj,Quad PF,6+ Mos 02/01/2017, 01/24/2018, 01/15/2019, 01/31/2020   Janssen (J&J) SARS-COV-2 Vaccination 07/21/2019   [name](Purple Top)SARS-COV-2 Vaccination 02/20/2020, 08/05/2020, 01/30/2021   PNEUMOCOCCAL CONJUGATE-20 03/30/2021   Pneumococcal Conjugate-13 03/06/2020   Tdap  07/01/2012    Health Maintenance  Topic Date Due   Diabetic kidney evaluation - Urine ACR  03/22/2022 (Originally 10/11/1973)   COVID-19 Vaccine (5 - Janssen risk series) 04/04/2022 (Originally 03/27/2021)   Zoster Vaccines- Shingrix (1 of 2) 06/19/2022 (Originally 10/11/1974)   Hepatitis C Screening  03/04/2023 (Originally 10/11/1973)   FOOT EXAM  03/30/2022   HEMOGLOBIN A1C  08/24/2022   OPHTHALMOLOGY EXAM  10/16/2022   Diabetic kidney evaluation - GFR measurement  10/31/2022   Medicare Annual Wellness (AWV)  03/20/2023   MAMMOGRAM  03/03/2024   COLONOSCOPY (Pts 45-95yrs Insurance coverage will need to be confirmed)  05/17/2024   Pneumonia Vaccine 41+ Years old  Completed   INFLUENZA VACCINE  Completed   DEXA SCAN  Completed   HPV VACCINES  Aged Out       Assessment  This is a routine wellness examination for [employer].  Health Maintenance: Due or Overdue There are no preventive care reminders to display for this patient.   Ann Torres does not need a referral for Community Assistance: Care Management:   no Social Work:    no Prescription Assistance:  no Nutrition/Diabetes Education:  no   Plan:  Personalized Goals  Goals Addressed               This Visit's Progress     Patient Stated (pt-stated)        03/19/2022 AWV Goal: Diabetes  Management  Patient will maintain an A1C level below 7.0 Patient will not develop any diabetic foot complications Patient will not experience any hypoglycemic episodes over the next 3 months Patient will notify our office of any CBG readings outside of the provider recommended range by calling [phone] Patient will adhere to provider recommendations for diabetes management  Patient Self Management Activities take all medications as prescribed and report any negative side effects monitor and record blood sugar readings as directed adhere to a low carbohydrate diet that incorporates lean proteins, vegetables, whole  grains, low glycemic fruits check feet daily noting any sores, cracks, injuries, or callous formations see PCP or podiatrist if she notices any changes in her legs, feet, or toenails Patient will visit PCP and have an A1C level checked every 3 to 6 months as directed  have a yearly eye exam to monitor for vascular changes associated with diabetes and will request that the report be sent to her pcp.  consult with her PCP regarding any changes in her health or new or worsening symptoms        Personalized Health Maintenance & Screening Recommendations  Urine ACR Shingles vaccine  Lung Cancer Screening Recommended: no (Low Dose CT Chest recommended if Age 43-80 years, 30 pack-year currently smoking OR have quit w/in past 15 years) Hepatitis C Screening recommended: no HIV Screening recommended: no  Advanced Directives: Written information was not prepared per patient's request.  Referrals & Orders No orders of the defined types were placed in this encounter.   Follow-up Plan Follow-up with [name], MD as planned Schedule shingles vaccine at the pharmacy. Medicare wellness visit in one year. Patient will access AVS on my chart.   I have personally reviewed and noted the following in the patient's chart:   Medical and social history Use of alcohol, tobacco or illicit drugs  Current medications and supplements Functional ability and status Nutritional status Physical activity Advanced directives List of other physicians Hospitalizations, surgeries, and ER visits in previous 12 months Vitals Screenings to include cognitive, depression, and falls Referrals and appointments  In addition, I have reviewed and discussed with Ann Torres certain preventive protocols, quality metrics, and best practice recommendations. A written personalized care plan for preventive services as well as general preventive health recommendations is available and can be mailed to the  patient at her request.      Modesto Charon, RN BSN  03/19/2022

## 2022-03-19 NOTE — Patient Instructions (Signed)
MEDICARE ANNUAL WELLNESS VISIT Health Maintenance Summary and Written Plan of Care  Ann Torres ,  Thank you for allowing me to perform your Medicare Annual Wellness Visit and for your ongoing commitment to your health.   Health Maintenance & Immunization History Health Maintenance  Topic Date Due   Diabetic kidney evaluation - Urine ACR  03/22/2022 (Originally 10/11/1973)   COVID-19 Vaccine (5 - Janssen risk series) 04/04/2022 (Originally 03/27/2021)   Zoster Vaccines- Shingrix (1 of 2) 06/19/2022 (Originally 10/11/1974)   Hepatitis C Screening  03/04/2023 (Originally 10/11/1973)   FOOT EXAM  03/30/2022   HEMOGLOBIN A1C  08/24/2022   OPHTHALMOLOGY EXAM  10/16/2022   Diabetic kidney evaluation - GFR measurement  10/31/2022   Medicare Annual Wellness (AWV)  03/20/2023   MAMMOGRAM  03/03/2024   COLONOSCOPY (Pts 45-69yrs Insurance coverage will need to be confirmed)  05/17/2024   Pneumonia Vaccine 19+ Years old  Completed   INFLUENZA VACCINE  Completed   DEXA SCAN  Completed   HPV VACCINES  Aged Out   Immunization History  Administered Date(s) Administered   Fluad Quad(high Dose 65+) 01/30/2021, 01/26/2022   Influenza,inj,Quad PF,6+ Mos 02/01/2017, 01/24/2018, 01/15/2019, 01/31/2020   Janssen (J&J) SARS-COV-2 Vaccination 07/21/2019   [name](Purple Top)SARS-COV-2 Vaccination 02/20/2020, 08/05/2020, 01/30/2021   PNEUMOCOCCAL CONJUGATE-20 03/30/2021   Pneumococcal Conjugate-13 03/06/2020   Tdap 07/01/2012    These are the patient goals that we discussed:  Goals Addressed               This Visit's Progress     Patient Stated (pt-stated)        03/19/2022 AWV Goal: Diabetes Management  Patient will maintain an A1C level below 7.0 Patient will not develop any diabetic foot complications Patient will not experience any hypoglycemic episodes over the next 3 months Patient will notify our office of any CBG readings outside of the provider recommended range by calling  [phone] Patient will adhere to provider recommendations for diabetes management  Patient Self Management Activities take all medications as prescribed and report any negative side effects monitor and record blood sugar readings as directed adhere to a low carbohydrate diet that incorporates lean proteins, vegetables, whole grains, low glycemic fruits check feet daily noting any sores, cracks, injuries, or callous formations see PCP or podiatrist if she notices any changes in her legs, feet, or toenails Patient will visit PCP and have an A1C level checked every 3 to 6 months as directed  have a yearly eye exam to monitor for vascular changes associated with diabetes and will request that the report be sent to her pcp.  consult with her PCP regarding any changes in her health or new or worsening symptoms          This is a list of Health Maintenance Items that are overdue or due now: Urine ACR Shingles vaccineThere are no preventive care reminders to display for this patient.    Orders/Referrals Placed Today: No orders of the defined types were placed in this encounter.  (Contact our referral department at [phone] if you have not spoken with someone about your referral appointment within the next 5 days)    Follow-up Plan Follow-up with [name], MD as planned Schedule shingles vaccine at the pharmacy. Medicare wellness visit in one year. Patient will access AVS on my chart.      Health Maintenance, Female Adopting a healthy lifestyle and getting preventive care are important in promoting health and wellness. Ask your health care provider about:  The right schedule for you to have regular tests and exams. Things you can do on your own to prevent diseases and keep yourself healthy. What should I know about diet, weight, and exercise? Eat a healthy diet  Eat a diet that includes plenty of vegetables, fruits, low-fat dairy products, and lean protein. Do  not eat a lot of foods that are high in solid fats, added sugars, or sodium. Maintain a healthy weight Body mass index (BMI) is used to identify weight problems. It estimates body fat based on height and weight. Your health care provider can help determine your BMI and help you achieve or maintain a healthy weight. Get regular exercise Get regular exercise. This is one of the most important things you can do for your health. Most adults should: Exercise for at least 150 minutes each week. The exercise should increase your heart rate and make you sweat (moderate-intensity exercise). Do strengthening exercises at least twice a week. This is in addition to the moderate-intensity exercise. Spend less time sitting. Even light physical activity can be beneficial. Watch cholesterol and blood lipids Have your blood tested for lipids and cholesterol at 67 years of age, then have this test every 5 years. Have your cholesterol levels checked more often if: Your lipid or cholesterol levels are high. You are older than 67 years of age. You are at high risk for heart disease. What should I know about cancer screening? Depending on your health history and family history, you may need to have cancer screening at various ages. This may include screening for: Breast cancer. Cervical cancer. Colorectal cancer. Skin cancer. Lung cancer. What should I know about heart disease, diabetes, and high blood pressure? Blood pressure and heart disease High blood pressure causes heart disease and increases the risk of stroke. This is more likely to develop in people who have high blood pressure readings or are overweight. Have your blood pressure checked: Every 3-5 years if you are 56-75 years of age. Every year if you are 84 years old or older. Diabetes Have regular diabetes screenings. This checks your fasting blood sugar level. Have the screening done: Once every three years after age 34 if you are at a normal  weight and have a low risk for diabetes. More often and at a younger age if you are overweight or have a high risk for diabetes. What should I know about preventing infection? Hepatitis B If you have a higher risk for hepatitis B, you should be screened for this virus. Talk with your health care provider to find out if you are at risk for hepatitis B infection. Hepatitis C Testing is recommended for: Everyone born from [birth year] through 1966. Anyone with known risk factors for hepatitis C. Sexually transmitted infections (STIs) Get screened for STIs, including gonorrhea and chlamydia, if: You are sexually active and are younger than 67 years of age. You are older than 67 years of age and your health care provider tells you that you are at risk for this type of infection. Your sexual activity has changed since you were last screened, and you are at increased risk for chlamydia or gonorrhea. Ask your health care provider if you are at risk. Ask your health care provider about whether you are at high risk for HIV. Your health care provider may recommend a prescription medicine to help prevent HIV infection. If you choose to take medicine to prevent HIV, you should first get tested for HIV. You should then be tested every  3 months for as long as you are taking the medicine. Pregnancy If you are about to stop having your period (premenopausal) and you may become pregnant, seek counseling before you get pregnant. Take 400 to 800 micrograms (mcg) of folic acid every day if you become pregnant. Ask for birth control (contraception) if you want to prevent pregnancy. Osteoporosis and menopause Osteoporosis is a disease in which the bones lose minerals and strength with aging. This can result in bone fractures. If you are 20 years old or older, or if you are at risk for osteoporosis and fractures, ask your health care provider if you should: Be screened for bone loss. Take a calcium or vitamin D supplement to  lower your risk of fractures. Be given hormone replacement therapy (HRT) to treat symptoms of menopause. Follow these instructions at home: Alcohol use Do not drink alcohol if: Your health care provider tells you not to drink. You are pregnant, may be pregnant, or are planning to become pregnant. If you drink alcohol: Limit how much you have to: 0-1 drink a day. Know how much alcohol is in your drink. In the [place] one drink equals one 12 oz bottle of beer (355 mL), one 5 oz glass of wine (148 mL), or one 1 oz glass of hard liquor (44 mL). Lifestyle Do not use any products that contain nicotine or tobacco. These products include cigarettes, chewing tobacco, and vaping devices, such as e-cigarettes. If you need help quitting, ask your health care provider. Do not use street drugs. Do not share needles. Ask your health care provider for help if you need support or information about quitting drugs. General instructions Schedule regular health, dental, and eye exams. Stay current with your vaccines. Tell your health care provider if: You often feel depressed. You have ever been abused or do not feel safe at home. Summary Adopting a healthy lifestyle and getting preventive care are important in promoting health and wellness. Follow your health care provider's instructions about healthy diet, exercising, and getting tested or screened for diseases. Follow your health care provider's instructions on monitoring your cholesterol and blood pressure. This information is not intended to replace advice given to you by your health care provider. Make sure you discuss any questions you have with your health care provider. Document Revised: 09/08/2020 Document Reviewed: 09/08/2020 Elsevier Patient Education  [place].

## 2022-03-22 ENCOUNTER — Encounter: Payer: Self-pay | Admitting: Family Medicine

## 2022-03-22 ENCOUNTER — Ambulatory Visit (INDEPENDENT_AMBULATORY_CARE_PROVIDER_SITE_OTHER): Payer: Medicare Other | Admitting: Family Medicine

## 2022-03-22 VITALS — BP 125/69 | HR 93 | Ht 64.0 in | Wt 155.0 lb

## 2022-03-22 DIAGNOSIS — I10 Essential (primary) hypertension: Secondary | ICD-10-CM

## 2022-03-22 DIAGNOSIS — E119 Type 2 diabetes mellitus without complications: Secondary | ICD-10-CM | POA: Diagnosis not present

## 2022-03-22 NOTE — Assessment & Plan Note (Signed)
Well controlled. Continue current regimen. Follow up in  3 mo .  

## 2022-03-22 NOTE — Progress Notes (Signed)
Established Patient Office Visit  Subjective   Patient ID: Ann Torres, female    DOB: 14-Feb-1956  Age: 66 y.o. MRN: [id number]  Chief Complaint  Patient presents with   Diabetes    HPI   Diabetes - . No wounds or sores that are not healing well. No increased thirst or urination. Checking glucose at home. Taking medications as prescribed without any side effects.  Some concerns that she is having some hypoglycemia in the middle the night usually around 3 AM.  Her last meal is her "evening" meal which is around 4 PM.  And then she has a low around 3 AM which is about 11 hours later.  Is actually been completely off of her insulin for about 2 weeks which is great.  She is also still worried about the postmeal spikes but they always seem to come down to less than 180 after 2 hours.  Hypertension- Pt denies chest pain, SOB, dizziness, or heart palpitations.  Taking meds as directed w/o problems.  Denies medication side effects.    She still having a lot of pain and problems with her left shoulder and she is left-handed.  She has been seeing Dr. Karie Schwalbe and has had physical therapy and several injections.  Unfortunately this last injection really only helped for maybe a week.    ROS    Objective:     BP 125/69   Pulse 93   Ht 5\' 4"  (1.626 m)   Wt 155 lb (70.3 kg)   SpO2 97%   BMI 26.61 kg/m    Physical Exam Vitals and nursing note reviewed.  Constitutional:      Appearance: She is well-developed.  HENT:     Head: Normocephalic and atraumatic.  Cardiovascular:     Rate and Rhythm: Normal rate and regular rhythm.     Heart sounds: Normal heart sounds.  Pulmonary:     Effort: Pulmonary effort is normal.     Breath sounds: Normal breath sounds.  Skin:    General: Skin is warm and dry.  Neurological:     Mental Status: She is alert and oriented to person, place, and time.  Psychiatric:        Behavior: Behavior normal.      No results found for any visits on  03/22/22.    The ASCVD Risk score (Arnett DK, et al., 2019) failed to calculate for the following reasons:   The valid total cholesterol range is 130 to 320 mg/dL   Unable to determine if patient is Non-Hispanic African American    Assessment & Plan:   Problem List Items Addressed This Visit       Cardiovascular and Mediastinum   Essential hypertension - Primary    Well controlled. Continue current regimen. Follow up in 3 mo          Endocrine   Diabetes mellitus without complication (HCC)    A1c of 6.9!! Doing well on the 4.5 Trulicity.  Hasn't usee insulin in 2 weeks. Doing great!!!  We will continue with current regimen she like to be able to keep a little bit of an insulin just in case it goes high she is so used to giving herself insulin.  I discussed eating a small protein snack later in the evening may be around 8 PM to avoid the 3 AM low blood sugar.      Left shoulder pain-encouraged her to get back in with Dr. 2020 or at least send him  a MyChart message to let him know that the injections have not helped it sounds like it might be a time to move forward with neck steps in regards to her shoulder.  Return in about 3 months (around 06/22/2022) for Diabetes follow-up.    Nani Gasser, MD

## 2022-03-22 NOTE — Assessment & Plan Note (Addendum)
A1c of 6.9!! Doing well on the 4.5 Trulicity.  Hasn't usee insulin in 2 weeks. Doing great!!!  We will continue with current regimen she like to be able to keep a little bit of an insulin just in case it goes high she is so used to giving herself insulin.  I discussed eating a small protein snack later in the evening may be around 8 PM to avoid the 3 AM low blood sugar.

## 2022-03-30 ENCOUNTER — Encounter: Payer: Self-pay | Admitting: Family Medicine

## 2022-03-30 DIAGNOSIS — E119 Type 2 diabetes mellitus without complications: Secondary | ICD-10-CM | POA: Diagnosis not present

## 2022-04-01 ENCOUNTER — Ambulatory Visit (INDEPENDENT_AMBULATORY_CARE_PROVIDER_SITE_OTHER): Payer: Medicare Other | Admitting: Sports Medicine

## 2022-04-01 ENCOUNTER — Ambulatory Visit (INDEPENDENT_AMBULATORY_CARE_PROVIDER_SITE_OTHER): Payer: Medicare Other

## 2022-04-01 DIAGNOSIS — M5412 Radiculopathy, cervical region: Secondary | ICD-10-CM | POA: Diagnosis not present

## 2022-04-01 DIAGNOSIS — M19012 Primary osteoarthritis, left shoulder: Secondary | ICD-10-CM | POA: Diagnosis not present

## 2022-04-01 DIAGNOSIS — M47812 Spondylosis without myelopathy or radiculopathy, cervical region: Secondary | ICD-10-CM | POA: Diagnosis not present

## 2022-04-01 MED ORDER — PREDNISONE 50 MG PO TABS: ORAL_TABLET | ORAL | 0 refills | Status: DC

## 2022-04-01 MED ORDER — GABAPENTIN 300 MG PO CAPS: ORAL_CAPSULE | ORAL | 3 refills | Status: DC

## 2022-04-01 NOTE — Assessment & Plan Note (Signed)
Also with pain radiating from the upper shoulder down to the fingers, numbness and tingling second through fifth fingers. Suspected radicular cause, adding x-rays, prednisone, home physical therapy, gabapentin for nocturnal symptoms, return to see me in 6 weeks, MR for interventional planning if no better. She does have sliding scale insulin to control her blood sugars on prednisone.

## 2022-04-01 NOTE — Assessment & Plan Note (Signed)
This is a very pleasant 66 year old female, she has a history of rheumatoid arthritis controlled with Orencia, she has chronic left shoulder pain, she has had physical therapy, medications, x-rays did show glenohumeral osteoarthritis, her last glenohumeral injection was approximately 4 months ago, but she only got about 4 weeks of relief. For this reason I do suspect we will need Dr. Everardo Pacific to weigh in regarding arthroplasty, as arthroplasty is on the horizon we will avoid intra-articular instrumentation.

## 2022-04-01 NOTE — Progress Notes (Signed)
    Procedures performed today:    None.  Independent interpretation of notes and tests performed by another provider:   None.  Brief History, Exam, Impression, and Recommendations:    Primary osteoarthritis of left shoulder This is a very pleasant 66 year old female, she has a history of rheumatoid arthritis controlled with Orencia, she has chronic left shoulder pain, she has had physical therapy, medications, x-rays did show glenohumeral osteoarthritis, her last glenohumeral injection was approximately 4 months ago, but she only got about 4 weeks of relief. For this reason I do suspect we will need Dr. Everardo Pacific to weigh in regarding arthroplasty, as arthroplasty is on the horizon we will avoid intra-articular instrumentation.  Radiculitis of left cervical region Also with pain radiating from the upper shoulder down to the fingers, numbness and tingling second through fifth fingers. Suspected radicular cause, adding x-rays, prednisone, home physical therapy, gabapentin for nocturnal symptoms, return to see me in 6 weeks, MR for interventional planning if no better. She does have sliding scale insulin to control her blood sugars on prednisone.    ____________________________________________ Ihor Austin. Benjamin Stain, M.D., ABFM., CAQSM., AME. Primary Care and Sports Medicine [facility]  Adjunct Professor of Family Medicine  [place] [employer] [facility] [employer]  Restaurant manager, fast food

## 2022-04-02 ENCOUNTER — Encounter: Payer: Self-pay | Admitting: Sports Medicine

## 2022-04-21 ENCOUNTER — Encounter: Payer: Self-pay | Admitting: Family Medicine

## 2022-04-21 DIAGNOSIS — I10 Essential (primary) hypertension: Secondary | ICD-10-CM

## 2022-04-22 MED ORDER — TELMISARTAN-HCTZ 40-12.5 MG PO TABS: 1.0000 | ORAL_TABLET | Freq: Every day | ORAL | 0 refills | Status: DC

## 2022-05-13 ENCOUNTER — Ambulatory Visit (INDEPENDENT_AMBULATORY_CARE_PROVIDER_SITE_OTHER): Payer: Medicare Other | Admitting: Sports Medicine

## 2022-05-13 DIAGNOSIS — M5412 Radiculopathy, cervical region: Secondary | ICD-10-CM

## 2022-05-13 DIAGNOSIS — I10 Essential (primary) hypertension: Secondary | ICD-10-CM | POA: Diagnosis not present

## 2022-05-13 MED ORDER — TELMISARTAN 40 MG PO TABS: 40.0000 mg | ORAL_TABLET | Freq: Every day | ORAL | 11 refills | Status: DC

## 2022-05-13 NOTE — Assessment & Plan Note (Signed)
This is a very pleasant 67 year old female, chronic axial neck pain with radiation down the upper shoulder to the fingers, numbness and tingling second through fifth fingers, we suspected a radicular cause, she is done a lot better with prednisone, home PT, gabapentin has been tremendously efficacious, 3 times daily was too high so she has settled on twice a day Happy with how things are going, she will continue home conditioning. She understands that either Dr. Madilyn Fireman or I can refill the medicine in the future when needed.

## 2022-05-13 NOTE — Assessment & Plan Note (Signed)
Ann Torres has had some difficulties with her blood pressure, initially she was on telmisartan 80 mg, she tells me she was actually cutting the pills in half, it sounds like blood pressure was pretty well-controlled but she was having maybe an occasional high so she was switched to telmisartan/HCTZ 40/12.5. She tells me she has been having significant low blood pressures, 80/60 sometimes, headaches, and orthostasis when standing. I think it is likely that now that her pain is better controlled with gabapentin she has a lower need for blood pressure medication, so I will refill her telmisartan but I think we should go back to her previous dosing regimen of 40 mg daily, she will check her blood pressures over the next couple of weeks and let Dr. Madilyn Fireman know for dose titration.

## 2022-05-13 NOTE — Progress Notes (Signed)
    Procedures performed today:    None.  Independent interpretation of notes and tests performed by another provider:   None.  Brief History, Exam, Impression, and Recommendations:    Radiculitis of left cervical region This is a very pleasant 67 year old female, chronic axial neck pain with radiation down the upper shoulder to the fingers, numbness and tingling second through fifth fingers, we suspected a radicular cause, she is done a lot better with prednisone, home PT, gabapentin has been tremendously efficacious, 3 times daily was too high so she has settled on twice a day Happy with how things are going, she will continue home conditioning. She understands that either Dr. Madilyn Fireman or I can refill the medicine in the future when needed.  Essential hypertension Ann Torres has had some difficulties with her blood pressure, initially she was on telmisartan 80 mg, she tells me she was actually cutting the pills in half, it sounds like blood pressure was pretty well-controlled but she was having maybe an occasional high so she was switched to telmisartan/HCTZ 40/12.5. She tells me she has been having significant low blood pressures, 80/60 sometimes, headaches, and orthostasis when standing. I think it is likely that now that her pain is better controlled with gabapentin she has a lower need for blood pressure medication, so I will refill her telmisartan but I think we should go back to her previous dosing regimen of 40 mg daily, she will check her blood pressures over the next couple of weeks and let Dr. Madilyn Fireman know for dose titration.  Chronic process not at goal with pharmacologic intervention  ____________________________________________ Gwen Her. Dianah Field, M.D., ABFM., CAQSM., AME. Primary Care and Sports Medicine [facility]  Adjunct Professor of [place] [employer] [facility] of Medicine  Risk manager

## 2022-05-14 DIAGNOSIS — Z79899 Other long term (current) drug therapy: Secondary | ICD-10-CM | POA: Diagnosis not present

## 2022-05-14 DIAGNOSIS — M0579 Rheumatoid arthritis with rheumatoid factor of multiple sites without organ or systems involvement: Secondary | ICD-10-CM | POA: Diagnosis not present

## 2022-05-14 DIAGNOSIS — G8929 Other chronic pain: Secondary | ICD-10-CM | POA: Diagnosis not present

## 2022-05-14 DIAGNOSIS — M25512 Pain in left shoulder: Secondary | ICD-10-CM | POA: Diagnosis not present

## 2022-05-18 ENCOUNTER — Encounter: Payer: Self-pay | Admitting: Family Medicine

## 2022-06-09 ENCOUNTER — Encounter: Payer: Self-pay | Admitting: Family Medicine

## 2022-06-15 ENCOUNTER — Other Ambulatory Visit: Payer: Self-pay | Admitting: *Deleted

## 2022-06-15 ENCOUNTER — Other Ambulatory Visit: Payer: 59 | Admitting: Pharmacist

## 2022-06-15 DIAGNOSIS — E119 Type 2 diabetes mellitus without complications: Secondary | ICD-10-CM

## 2022-06-15 MED ORDER — FREESTYLE LIBRE 2 SENSOR MISC: 4 refills | Status: DC

## 2022-06-15 NOTE — Patient Instructions (Addendum)
Hi Armanda Magic chatting with you today. I am sorry there were previous order issues! I submitted a new order to the company for upgrading to the Solway 3 device, as we discussed.  Please let me know if you have any issues or questions. I will touch base with you in about 1 month just to be sure the order is straightened out.  Take care, Luana Shu, PharmD Clinical Pharmacist [facility] [phone]

## 2022-06-15 NOTE — Progress Notes (Signed)
06/15/2022 Name: Ann Torres MRN: [id number] DOB: December 31, 1955   Ann Torres is a 67 y.o. year old female who presented for a telephone visit.   They were referred to the pharmacist by their PCP for assistance in managing medication access. - CGM.    Subjective:  Care Team: Primary Care Provider: Hali Marry, MD ; Next Scheduled Visit: 06/22/22  Medication Access/Adherence  Current Pharmacy:  [place], [place] [address] Phone: [phone] Fax: [phone]  [facility] [place], [place] [id number] [place] [address] Phone: [phone] Fax: [phone]  [facility] [address] [place] [address] Phone: [phone] Fax: [phone]   Diabetes:  Current medications:  trulicity [id number] weekly, some nausea around day 3 but no longer requiring zofran.   Current glucose readings: 100-110 fasting, will have some hypoglycemic symptoms with this range.  Using Rhodes 2 meter; testing continuously    Patient reports occasional hypoglycemic s/sx including dizziness, shakiness, sweating with BG ~100. Denies BG <70. Patient denies hyperglycemic symptoms including polyuria, polydipsia, polyphagia, nocturia, neuropathy, blurred vision.   Current medication access support: Libre 2 sensors via DME supplier, [employer]   Objective:  Lab Results  Component Value Date   HGBA1C 7.2 (A) 02/22/2022    Lab Results  Component Value Date   CREATININE 0.71 10/30/2021   BUN 18 10/30/2021   NA 140 10/30/2021   K 4.5 10/30/2021   CL 104 10/30/2021   CO2 27 10/30/2021    Lab Results  Component Value Date   CHOL 108 10/30/2021   HDL 46 (L) 10/30/2021   LDLCALC 41 10/30/2021   TRIG 128 10/30/2021   CHOLHDL 2.3 10/30/2021     Medications Reviewed Today     Reviewed by Darius Bump, [place] (Pharmacist) on 06/15/22 at 1150  Med List Status: <None>   Medication Order Taking? Sig Documenting Provider Last Dose Status Informant  Abatacept 125 MG/ML SOAJ [id number] Yes Inject 1 Dose into the skin once a week. Sundays [provider] Taking Active   acetaminophen (TYLENOL) 650 MG CR tablet [id number] No Take 2 tablets (1,300 mg total) by mouth in the morning and at bedtime.  Patient not taking: Reported on 06/15/2022   Silverio Decamp, MD Not Taking Active   albuterol (VENTOLIN HFA) 108 (90 Base) MCG/ACT inhaler [id number] Yes Inhale 2 puffs into the lungs every 6 (six) hours as needed for wheezing or shortness of breath. Hali Marry, MD Taking Active   aspirin EC 81 MG tablet [id number] Yes Take 81 mg by mouth daily. Swallow whole. [provider] Taking Active            Med Note Tonny Bollman Dec 17, 2021  9:23 AM) Switched to every other day per cardiology as of 12/17/21 review  atorvastatin (LIPITOR) 20 MG tablet [id number] Yes Take 1 tablet (20 mg total) by mouth at bedtime. Hali Marry, MD Taking Active            Med Note Darra Lis Jun 15, 2022 11:46 AM) Taking 26m (half tablet) every other day due to muscle pains, 06/15/22 review.  Calcium-Vitamin D-Vitamin K (VIACTIV CALCIUM PLUS D) 650-12.5-40 MG-MCG-MCG CHEW [id number] Chew 2 tablets by mouth in the morning and at  bedtime. [provider] Taking Active   Dulaglutide (TRULICITY) 4.5 0000000 SOPN [id number] Yes Inject 4.5 mg as directed once a week. Hali Marry, MD Taking Active   EPINEPHrine 0.3 mg/0.3 mL IJ SOAJ injection [id number] Yes Inject 0.3 mg into the muscle as needed for anaphylaxis. Hali Marry, MD Taking Active   estradiol [facility] VAGINAL) 0.1 MG/GM vaginal cream [id number] Yes Place 1 Applicatorful vaginally at bedtime. For 2 weeks then 3 times a week.  Hali Marry, MD Taking Active   gabapentin (NEURONTIN) 300 MG capsule [id number] Yes Take 1 capsule (300 mg total) by mouth 2 (two) times daily. One tab PO qHS for a week, then BID for a week, then TID. May double weekly to a max of 3,695m/day TSilverio Decamp MD Taking Active   insulin lispro (HUMALOG) 100 UNIT/ML injection [id number] Inject 0.05-0.1 mLs (5-10 Units total) into the skin 3 (three) times daily before meals. MHali Marry MD Taking Active   INSULIN SYRINGE 1CC/29G 29G X 1/2" 1 ML MISC [id number] Use up to TID for insulin. Ok to sub if needed as covered by insurance. MHali Marry MD Taking Active   omeprazole (PRILOSEC) 20 MG capsule [id number] Take 1 capsule (20 mg total) by mouth daily. MHali Marry MD Taking Active   ondansetron (ZOFRAN-ODT) 8 MG disintegrating tablet [id number] Take 1 tablet (8 mg total) by mouth every 8 (eight) hours as needed for nausea. TSilverio Decamp MD Taking Active   telmisartan (MICARDIS) 40 MG tablet [id number] Take 1 tablet (40 mg total) by mouth daily. TSilverio Decamp MD Taking Active            Med Note (Darra LisFeb 13, 2024 11:50 AM) Taking 813mfrom old tablets, as of 06/15/22 review.              Assessment/Plan:   Diabetes: - Currently controlled - Recommend to continue current regimen  - In response to some order issues, today I resubmitted RX for CGM using [employer] online portal, after discussion today, patient wishes to try the [employer] (instead of current use, Libre 2), since she is having less erratic numbers that previously bothered her with getting too many alarms through using her phone app. Libre 3 also now has a handheld reader device, which will come with the order. - Recommend to check glucose using CGM, contact pharmacist if issues with order    Follow Up Plan: 1 month  KeLarinda ButteryPharmD Clinical Pharmacist CoThe Center For G[facility] At [facility] [phone]

## 2022-06-22 ENCOUNTER — Ambulatory Visit (INDEPENDENT_AMBULATORY_CARE_PROVIDER_SITE_OTHER): Payer: 59 | Admitting: Family Medicine

## 2022-06-22 ENCOUNTER — Encounter: Payer: Self-pay | Admitting: Family Medicine

## 2022-06-22 VITALS — BP 134/68 | HR 72 | Ht 64.0 in | Wt 150.0 lb

## 2022-06-22 DIAGNOSIS — I10 Essential (primary) hypertension: Secondary | ICD-10-CM | POA: Diagnosis not present

## 2022-06-22 DIAGNOSIS — E119 Type 2 diabetes mellitus without complications: Secondary | ICD-10-CM

## 2022-06-22 LAB — COMPLETE METABOLIC PANEL WITH GFR
AG Ratio: 1.9 (calc) (ref 1.0–2.5)
ALT: 16 U/L (ref 6–29)
AST: 16 U/L (ref 10–35)
Albumin: 4.5 g/dL (ref 3.6–5.1)
Alkaline phosphatase (APISO): 111 U/L (ref 37–153)
BUN: 16 mg/dL (ref 7–25)
CO2: 28 mmol/L (ref 20–32)
Calcium: 9.5 mg/dL (ref 8.6–10.4)
Chloride: 105 mmol/L (ref 98–110)
Creat: 0.67 mg/dL (ref 0.50–1.05)
Globulin: 2.4 g/dL (calc) (ref 1.9–3.7)
Glucose, Bld: 125 mg/dL — ABNORMAL HIGH (ref 65–99)
Potassium: 4.5 mmol/L (ref 3.5–5.3)
Sodium: 141 mmol/L (ref 135–146)
Total Bilirubin: 0.6 mg/dL (ref 0.2–1.2)
Total Protein: 6.9 g/dL (ref 6.1–8.1)
eGFR: 96 mL/min/{1.73_m2} (ref >=60)

## 2022-06-22 LAB — POCT GLYCOSYLATED HEMOGLOBIN (HGB A1C): Hemoglobin A1C: 6.8 % — AB (ref 4.0–5.6)

## 2022-06-22 LAB — POCT UA - MICROALBUMIN
Albumin/Creatinine Ratio, Urine, POC: <30
Creatinine, POC: 100 mg/dL
Microalbumin Ur, POC: 30 mg/L

## 2022-06-22 MED ORDER — ATORVASTATIN CALCIUM 20 MG PO TABS: 20.0000 mg | ORAL_TABLET | Freq: Every day | ORAL | 3 refills | Status: DC

## 2022-06-22 MED ORDER — TRULICITY 4.5 MG/0.5ML ~~LOC~~ SOAJ: 4.5000 mg | SUBCUTANEOUS | 3 refills | Status: DC

## 2022-06-22 MED ORDER — TELMISARTAN 80 MG PO TABS: 80.0000 mg | ORAL_TABLET | Freq: Every day | ORAL | 1 refills | Status: DC

## 2022-06-22 NOTE — Assessment & Plan Note (Signed)
Send in BP log after 2 weeks so I can look and see if we need to make a change Stay on the 41m of BP pill for now.

## 2022-06-22 NOTE — Patient Instructions (Signed)
Send in BP log after 2 weeks so I can look and see if we need to make a change Stay on the 96m of BP pill for now.

## 2022-06-22 NOTE — Assessment & Plan Note (Signed)
A1c looks great and is back under 7.0.  She is really doing well.  Weight is down an additional 5 pounds and BMI is now 25.  Continue to work on regular exercise.  Try to add in some resistance training.

## 2022-06-22 NOTE — Progress Notes (Signed)
Established Patient Office Visit  Subjective   Patient ID: Ann Torres, female    DOB: 1956/02/24  Age: 67 y.o. MRN: [id number]  Chief Complaint  Patient presents with   Diabetes    HPI  Hypertension- Pt denies chest pain, SOB, dizziness, or heart palpitations.  Taking meds as directed w/o problems.  Denies medication side effects.  Since we added the hydrochlorothiazide she been having pretty severe headaches.  When she saw sports medicine her blood pressure was back down and it was almost on the low end.  It may have been pain that was really bumping up her pressure.  He took the HCTZ component back out of the medication and sent over 40 mg but she says she is actually taking 80 mg of telmisartan.  The headaches have gotten much better since removing the HCTZ.  Diabetes - no hypoglycemic events. No wounds or sores that are not healing well. No increased thirst or urination. Checking glucose at home. Taking medications as prescribed without any side effects.     ROS    Objective:     BP 134/68   Pulse 72   Ht 5' 4"$  (1.626 m)   Wt 150 lb (68 kg)   SpO2 100%   BMI 25.75 kg/m    Physical Exam Vitals and nursing note reviewed.  Constitutional:      Appearance: She is well-developed.  HENT:     Head: Normocephalic and atraumatic.  Cardiovascular:     Rate and Rhythm: Normal rate and regular rhythm.     Heart sounds: Normal heart sounds.  Pulmonary:     Effort: Pulmonary effort is normal.     Breath sounds: Normal breath sounds.  Skin:    General: Skin is warm and dry.  Neurological:     Mental Status: She is alert and oriented to person, place, and time.  Psychiatric:        Behavior: Behavior normal.     Results for orders placed or performed in visit on 06/22/22  POCT glycosylated hemoglobin (Hb A1C)  Result Value Ref Range   Hemoglobin A1C 6.8 (A) 4.0 - 5.6 %   HbA1c POC (<> result, manual entry)     HbA1c, POC (prediabetic range)     HbA1c, POC  (controlled diabetic range)    POCT UA - Microalbumin  Result Value Ref Range   Microalbumin Ur, POC 30 mg/L   Creatinine, POC 100 mg/dL   Albumin/Creatinine Ratio, Urine, POC <30       The ASCVD Risk score (Arnett DK, et al., 2019) failed to calculate for the following reasons:   The valid total cholesterol range is 130 to 320 mg/dL   Unable to determine if patient is Non-Hispanic African American    Assessment & Plan:   Problem List Items Addressed This Visit       Cardiovascular and Mediastinum   Essential hypertension    Send in BP log after 2 weeks so I can look and see if we need to make a change Stay on the 43m of BP pill for now.       Relevant Medications   telmisartan (MICARDIS) 80 MG tablet   atorvastatin (LIPITOR) 20 MG tablet     Endocrine   Diabetes mellitus without complication (HCC) - Primary    A1c looks great and is back under 7.0.  She is really doing well.  Weight is down an additional 5 pounds and BMI is now 25.  Continue  to work on regular exercise.  Try to add in some resistance training.      Relevant Medications   telmisartan (MICARDIS) 80 MG tablet   atorvastatin (LIPITOR) 20 MG tablet   Dulaglutide (TRULICITY) 4.5 0000000 SOPN   Other Relevant Orders   COMPLETE METABOLIC PANEL WITH GFR   POCT glycosylated hemoglobin (Hb A1C) (Completed)   POCT UA - Microalbumin (Completed)    Return in about 13 weeks (around 09/21/2022) for Diabetes follow-up, Hypertension.    Beatrice Lecher, MD

## 2022-06-23 NOTE — Progress Notes (Signed)
Your lab work is within acceptable range and there are no concerning findings.   ?

## 2022-06-29 DIAGNOSIS — E119 Type 2 diabetes mellitus without complications: Secondary | ICD-10-CM | POA: Diagnosis not present

## 2022-07-03 ENCOUNTER — Other Ambulatory Visit: Payer: Self-pay | Admitting: Family Medicine

## 2022-07-07 ENCOUNTER — Encounter: Payer: Self-pay | Admitting: Family Medicine

## 2022-07-07 MED ORDER — TELMISARTAN-AMLODIPINE 80-5 MG PO TABS: 1.0000 | ORAL_TABLET | Freq: Every day | ORAL | 1 refills | Status: DC

## 2022-07-26 ENCOUNTER — Encounter: Payer: Self-pay | Admitting: Family Medicine

## 2022-08-15 ENCOUNTER — Encounter: Payer: Self-pay | Admitting: Family Medicine

## 2022-08-16 ENCOUNTER — Ambulatory Visit (INDEPENDENT_AMBULATORY_CARE_PROVIDER_SITE_OTHER): Payer: 59 | Admitting: Family Medicine

## 2022-08-16 ENCOUNTER — Encounter: Payer: Self-pay | Admitting: Family Medicine

## 2022-08-16 VITALS — BP 132/85 | HR 78 | Ht 64.0 in | Wt 150.0 lb

## 2022-08-16 DIAGNOSIS — I10 Essential (primary) hypertension: Secondary | ICD-10-CM | POA: Diagnosis not present

## 2022-08-16 DIAGNOSIS — L853 Xerosis cutis: Secondary | ICD-10-CM

## 2022-08-16 DIAGNOSIS — L03012 Cellulitis of left finger: Secondary | ICD-10-CM

## 2022-08-16 MED ORDER — DOXYCYCLINE HYCLATE 100 MG PO TABS: 100.0000 mg | ORAL_TABLET | Freq: Two times a day (BID) | ORAL | 0 refills | Status: DC

## 2022-08-16 NOTE — Assessment & Plan Note (Signed)
Pressure is looking better we will continue to monitor she has a follow-up appointment in June.

## 2022-08-16 NOTE — Progress Notes (Signed)
   Acute Office Visit  Subjective:     Patient ID: Ann Torres, female    DOB: 1955-10-04, 67 y.o.   MRN: [id number]  No chief complaint on file.   HPI Patient is in today for infection on 4th finger on left hand.  Says trimmed nails had some extra skin and pulled it.  The area bled and within a day or 2 later it started looking red and swollen she would notice some red streaking on Saturday she had some drainage and pus and discharge.  It still tender to touch but actually looks a little bit better today she has been using bag balm on it.  She has been doing well in regards to her blood pressure after we adjusted her medication regimen she says sometimes in the afternoon it gets a little bit low around 106 or 110 and she can feel little bit more sluggish.  Also wanted a recommendation for dry scaling skin.  ROS      Objective:    BP 132/85   Pulse 78   Ht 5\' 4"  (1.626 m)   Wt 150 lb (68 kg)   SpO2 97%   BMI 25.75 kg/m    Physical Exam  No results found for any visits on 08/16/22.      Assessment & Plan:   Problem List Items Addressed This Visit       Cardiovascular and Mediastinum   Essential hypertension    Pressure is looking better we will continue to monitor she has a follow-up appointment in June.      Other Visit Diagnoses     Paronychia of finger of left hand    -  Primary   Dry skin          Paronychia-I do not detect any abscess I think right now just mostly induration and erythema swelling and heat.  So we will go ahead and treat with doxycycline.  Call if not better in 3 to 4 days.  Recommend Epsom salt soaks.  Okay to continue with the bag balm to moisturize.  Skin-recommended trial of AmLactin which also has a keratolytic in it.  Meds ordered this encounter  Medications   doxycycline (VIBRA-TABS) 100 MG tablet    Sig: Take 1 tablet (100 mg total) by mouth 2 (two) times daily.    Dispense:  14 tablet    Refill:  0    Avoid red dye if  possible.    Return if symptoms worsen or fail to improve.  Nani Gasser, MD

## 2022-08-24 DIAGNOSIS — M9904 Segmental and somatic dysfunction of sacral region: Secondary | ICD-10-CM | POA: Diagnosis not present

## 2022-08-24 DIAGNOSIS — M5136 Other intervertebral disc degeneration, lumbar region: Secondary | ICD-10-CM | POA: Diagnosis not present

## 2022-08-24 DIAGNOSIS — M5030 Other cervical disc degeneration, unspecified cervical region: Secondary | ICD-10-CM | POA: Diagnosis not present

## 2022-08-24 DIAGNOSIS — M5413 Radiculopathy, cervicothoracic region: Secondary | ICD-10-CM | POA: Diagnosis not present

## 2022-08-24 DIAGNOSIS — M9902 Segmental and somatic dysfunction of thoracic region: Secondary | ICD-10-CM | POA: Diagnosis not present

## 2022-08-24 DIAGNOSIS — M9901 Segmental and somatic dysfunction of cervical region: Secondary | ICD-10-CM | POA: Diagnosis not present

## 2022-08-24 DIAGNOSIS — M5442 Lumbago with sciatica, left side: Secondary | ICD-10-CM | POA: Diagnosis not present

## 2022-08-24 DIAGNOSIS — M9903 Segmental and somatic dysfunction of lumbar region: Secondary | ICD-10-CM | POA: Diagnosis not present

## 2022-09-13 ENCOUNTER — Other Ambulatory Visit: Payer: Self-pay | Admitting: Family Medicine

## 2022-09-15 ENCOUNTER — Ambulatory Visit (INDEPENDENT_AMBULATORY_CARE_PROVIDER_SITE_OTHER): Payer: 59 | Admitting: Physician Assistant

## 2022-09-15 ENCOUNTER — Encounter: Payer: Self-pay | Admitting: Physician Assistant

## 2022-09-15 VITALS — BP 154/70 | HR 76 | Ht 64.0 in | Wt 149.0 lb

## 2022-09-15 DIAGNOSIS — W57XXXA Bitten or stung by nonvenomous insect and other nonvenomous arthropods, initial encounter: Secondary | ICD-10-CM | POA: Diagnosis not present

## 2022-09-15 DIAGNOSIS — S80862A Insect bite (nonvenomous), left lower leg, initial encounter: Secondary | ICD-10-CM | POA: Diagnosis not present

## 2022-09-15 DIAGNOSIS — S30860A Insect bite (nonvenomous) of lower back and pelvis, initial encounter: Secondary | ICD-10-CM

## 2022-09-15 DIAGNOSIS — R42 Dizziness and giddiness: Secondary | ICD-10-CM | POA: Diagnosis not present

## 2022-09-15 DIAGNOSIS — L03113 Cellulitis of right upper limb: Secondary | ICD-10-CM

## 2022-09-15 MED ORDER — TRIAMCINOLONE ACETONIDE 0.1 % EX CREA: 1.0000 | TOPICAL_CREAM | Freq: Two times a day (BID) | CUTANEOUS | 0 refills | Status: AC

## 2022-09-15 MED ORDER — MECLIZINE HCL 25 MG PO TABS: 25.0000 mg | ORAL_TABLET | Freq: Three times a day (TID) | ORAL | 0 refills | Status: DC | PRN

## 2022-09-15 MED ORDER — DOXYCYCLINE HYCLATE 100 MG PO TABS: 100.0000 mg | ORAL_TABLET | Freq: Two times a day (BID) | ORAL | 0 refills | Status: DC

## 2022-09-15 NOTE — Progress Notes (Signed)
Acute Office Visit  Subjective:     Patient ID: Ann Torres, female    DOB: June 02, 1955, 67 y.o.   MRN: [id number]  Chief Complaint  Patient presents with   Rash    HPI Patient is in today for itchy bumps in her groin. She went to her daughters house this weekend and came back with bug bites on her arm that are red and itchy bumps in groin. Not tried anything to make better. No fever, chills, nausea, vomiting, headache, or rash.   History of vertigo. Did vestibular rehab in 2023. Not sure if helped or not. Recently felt like more episodes of dizziness with positional changes. Not taking antivert. No URI symptoms, no ear pain, rhinorrhea, sinus pressure.   .. Active Ambulatory Problems    Diagnosis Date Noted   Essential hypertension 02/01/2017   Rheumatoid arthritis involving multiple sites (HCC) 02/01/2017   Diabetes mellitus without complication (HCC) 02/01/2017   History of colon polyps 02/01/2017   Depression, major, single episode, complete remission (HCC) 02/01/2017   Hyperlipidemia 02/01/2017   Calcific Achilles tendinitis of left lower extremity 10/06/2017   Haglund's deformity of left heel 10/06/2017   Primary insomnia 03/23/2018   Long-term use of high-risk medication 02/06/2018   Trigger finger 08/07/2019   Memory impairment 12/06/2019   Allergy to honey bee venom 08/04/2020   Osteopenia 08/27/2020   Lumbar spinal stenosis with right-sided radiculitis 01/12/2021   History of herpes genitalis 05/11/2021   Vaginal atrophy 05/11/2021   Primary osteoarthritis of left shoulder 12/04/2021   Radiculitis of left cervical region 04/01/2022   Resolved Ambulatory Problems    Diagnosis Date Noted   Need for immunization against influenza 02/01/2017   Tooth abscess 04/28/2019   SOB (shortness of breath) 08/04/2020   Vaginal irritation 05/11/2021   No Additional Past Medical History     ROS  See HPI.     Objective:    BP (!) 154/70   Pulse 76   Ht 5\' 4"   (1.626 m)   Wt 149 lb (67.6 kg)   SpO2 99%   BMI 25.58 kg/m  BP Readings from Last 3 Encounters:  09/15/22 (!) 154/70  08/16/22 132/85  06/22/22 134/68   Wt Readings from Last 3 Encounters:  09/15/22 149 lb (67.6 kg)  08/16/22 150 lb (68 kg)  06/22/22 150 lb (68 kg)      Physical Exam Right forearm puntate hole with surround erythema 3cm by 4cm.   4-5 erythematous papules in the right groin with surrounding erythema.   1 erythematous papule with attached tic in her left groin.    Tick removed with alcohol and tweezers.       Assessment & Plan:  Marland KitchenMarland KitchenMileya was seen today for rash.  Diagnoses and all orders for this visit:  Tick bite of left lower leg, initial encounter -     doxycycline (VIBRA-TABS) 100 MG tablet; Take 1 tablet (100 mg total) by mouth 2 (two) times daily. For 10 days.  Insect bite of pelvic region, initial encounter -     triamcinolone cream (KENALOG) 0.1 %; Apply 1 Application topically 2 (two) times daily.  Cellulitis of right upper extremity -     doxycycline (VIBRA-TABS) 100 MG tablet; Take 1 tablet (100 mg total) by mouth 2 (two) times daily. For 10 days.  Vertigo -     meclizine (ANTIVERT) 25 MG tablet; Take 1 tablet (25 mg total) by mouth 3 (three) times daily as needed for dizziness.  Tick removed today in office Treated with doxycycline due to erythema and known tick attachment for lymes prevention and cellulitis. Triamcinolone for itching  Cool compress can help  Discussed vertigo Epley manuevers given Antivert to use as needed  If not improving follow up with PCP Tandy Gaw, PA-C

## 2022-09-15 NOTE — Patient Instructions (Signed)
Vertigo Vertigo is the feeling that you or your surroundings are moving when they are not. This feeling can come and go at any time. Vertigo often goes away on its own. Vertigo can be dangerous if it occurs while you are doing something that could endanger yourself or others, such as driving or operating machinery. Your health care provider will do tests to try to determine the cause of your vertigo. Tests will also help your health care provider decide how best to treat your condition. Follow these instructions at home: Eating and drinking     Dehydration can make vertigo worse. Drink enough fluid to keep your urine pale yellow. Do not drink alcohol. Activity Return to your normal activities as told by your health care provider. Ask your health care provider what activities are safe for you. In the morning, first sit up on the side of the bed. When you feel okay, stand slowly while you hold onto something until you know that your balance is fine. Move slowly. Avoid sudden body or head movements or certain positions, as told by your health care provider. If you have trouble walking or keeping your balance, try using a cane for stability. If you feel dizzy or unstable, sit down right away. Avoid doing any tasks that would cause danger to you or others if vertigo occurs. Avoid bending down if you feel dizzy. Place items in your home so that they are easy for you to reach without bending or leaning over. Do not drive or use machinery if you feel dizzy. General instructions Take over-the-counter and prescription medicines only as told by your health care provider. Keep all follow-up visits. This is important. Contact a health care provider if: Your medicines do not relieve your vertigo or they make it worse. Your condition gets worse or you develop new symptoms. You have a fever. You develop nausea or vomiting, or if nausea gets worse. Your family or friends notice any behavioral changes. You  have numbness or a prickling and tingling sensation in part of your body. Get help right away if you: Are always dizzy or you faint. Develop severe headaches. Develop a stiff neck. Develop sensitivity to light. Have difficulty moving or speaking. Have weakness in your hands, arms, or legs. Have changes in your hearing or vision. These symptoms may represent a serious problem that is an emergency. Do not wait to see if the symptoms will go away. Get medical help right away. Call your local emergency services (911 in the [place] Do not drive yourself to the hospital. Summary Vertigo is the feeling that you or your surroundings are moving when they are not. Your health care provider will do tests to try to determine the cause of your vertigo. Follow instructions for home care. You may be told to avoid certain tasks, positions, or movements. Contact a health care provider if your medicines do not relieve your symptoms, or if you have a fever, nausea, vomiting, or changes in behavior. Get help right away if you have severe headaches or difficulty speaking, or you develop hearing or vision problems. This information is not intended to replace advice given to you by your health care provider. Make sure you discuss any questions you have with your health care provider. Document Revised: 03/19/2020 Document Reviewed: 03/19/2020 Elsevier Patient Education  2023 Elsevier Inc.  

## 2022-09-21 ENCOUNTER — Ambulatory Visit: Payer: 59 | Admitting: Family Medicine

## 2022-09-28 DIAGNOSIS — E119 Type 2 diabetes mellitus without complications: Secondary | ICD-10-CM | POA: Diagnosis not present

## 2022-10-04 ENCOUNTER — Ambulatory Visit (INDEPENDENT_AMBULATORY_CARE_PROVIDER_SITE_OTHER): Payer: 59 | Admitting: Family Medicine

## 2022-10-04 ENCOUNTER — Encounter: Payer: Self-pay | Admitting: Family Medicine

## 2022-10-04 VITALS — BP 129/72 | HR 77 | Ht 64.0 in | Wt 147.0 lb

## 2022-10-04 DIAGNOSIS — E119 Type 2 diabetes mellitus without complications: Secondary | ICD-10-CM | POA: Diagnosis not present

## 2022-10-04 DIAGNOSIS — I10 Essential (primary) hypertension: Secondary | ICD-10-CM

## 2022-10-04 DIAGNOSIS — G4700 Insomnia, unspecified: Secondary | ICD-10-CM

## 2022-10-04 DIAGNOSIS — Z7984 Long term (current) use of oral hypoglycemic drugs: Secondary | ICD-10-CM

## 2022-10-04 DIAGNOSIS — F418 Other specified anxiety disorders: Secondary | ICD-10-CM

## 2022-10-04 DIAGNOSIS — F325 Major depressive disorder, single episode, in full remission: Secondary | ICD-10-CM

## 2022-10-04 LAB — POCT GLYCOSYLATED HEMOGLOBIN (HGB A1C): Hemoglobin A1C: 6.4 % — AB (ref 4.0–5.6)

## 2022-10-04 MED ORDER — DOXEPIN HCL 10 MG PO CAPS
10.0000 mg | ORAL_CAPSULE | Freq: Every day | ORAL | 1 refills | Status: DC
Start: 2022-10-04 — End: 2022-11-30

## 2022-10-04 MED ORDER — CITALOPRAM HYDROBROMIDE 20 MG PO TABS
ORAL_TABLET | ORAL | 1 refills | Status: DC
Start: 2022-10-04 — End: 2022-11-19

## 2022-10-04 NOTE — Progress Notes (Signed)
Established Patient Office Visit  Subjective   Patient ID: Ann Torres, female    DOB: 1956/03/09  Age: 67 y.o. MRN: [id number]  Chief Complaint  Patient presents with   Diabetes   Insomnia    HPI Diabetes - no hypoglycemic events. No wounds or sores that are not healing well. No increased thirst or urination. Checking glucose at home. Taking medications as prescribed without any side effects.  She is up to 4.5 mg on her Trulicity.  Hypertension- Pt denies chest pain, SOB, dizziness, or heart palpitations.  Taking meds as directed w/o problems.  Denies medication side effects.    Still working on [employer].   Trouble sleeping.  Goes to bed initially falls asleep after about 2 hours wakes up and then can go back to sleep.  Her thoughts to start racing about random things.  She has had some stressors recently but she does not feel like her necessarily more than usual.  She used to be on citalopram but has been able to go without it for about 3 to 4 years now.    ROS    Objective:     BP 129/72   Pulse 77   Ht 5\' 4"  (1.626 m)   Wt 147 lb (66.7 kg)   SpO2 98%   BMI 25.23 kg/m    Physical Exam   Results for orders placed or performed in visit on 10/04/22  POCT glycosylated hemoglobin (Hb A1C)  Result Value Ref Range   Hemoglobin A1C 6.4 (A) 4.0 - 5.6 %   HbA1c POC (<> result, manual entry)     HbA1c, POC (prediabetic range)     HbA1c, POC (controlled diabetic range)        The ASCVD Risk score (Arnett DK, et al., 2019) failed to calculate for the following reasons:   The valid total cholesterol range is 130 to 320 mg/dL   Unable to determine if patient is Non-Hispanic African American    Assessment & Plan:   Problem List Items Addressed This Visit       Cardiovascular and Mediastinum   Essential hypertension    Well controlled. Continue current regimen. Follow up in  30mo         Endocrine   Diabetes mellitus without complication (HCC) -  Primary    C looks really good she is doing a fantastic job.  Continue current regimen continue work on healthy food choices and stay active.  Follow-up in 3 to 4 months.  Lab Results  Component Value Date   HGBA1C 6.4 (A) 10/04/2022        Relevant Orders   POCT glycosylated hemoglobin (Hb A1C) (Completed)     Other   Depression, major, single episode, complete remission (HCC)    She is interested in restarting citalopram it has been a few years since he has taken it but she is just noticed that she is struggling a little bit more with her emotions and feeling anxious.  Stressors are about the same nothing new currently.  Will restart citalopram at half a tab of 20 mg for 2 weeks and then go up to a whole tab daily.      Relevant Medications   citalopram (CELEXA) 20 MG tablet   doxepin (SINEQUAN) 10 MG capsule   Other Visit Diagnoses     Depression with anxiety       Relevant Medications   citalopram (CELEXA) 20 MG tablet   doxepin (SINEQUAN) 10 MG capsule  Insomnia, unspecified type       Relevant Medications   doxepin (SINEQUAN) 10 MG capsule       Insomnia - will tx with doxepin. Call if not doing well. Hopefully will be just for 1-2 months.    Return in about 6 weeks (around 11/15/2022) for New start medication .    Nani Gasser, MD

## 2022-10-04 NOTE — Assessment & Plan Note (Signed)
Well controlled. Continue current regimen. Follow up in  6 mo  

## 2022-10-04 NOTE — Assessment & Plan Note (Signed)
C looks really good she is doing a fantastic job.  Continue current regimen continue work on healthy food choices and stay active.  Follow-up in 3 to 4 months.  Lab Results  Component Value Date   HGBA1C 6.4 (A) 10/04/2022

## 2022-10-04 NOTE — Assessment & Plan Note (Signed)
She is interested in restarting citalopram it has been a few years since he has taken it but she is just noticed that she is struggling a little bit more with her emotions and feeling anxious.  Stressors are about the same nothing new currently.  Will restart citalopram at half a tab of 20 mg for 2 weeks and then go up to a whole tab daily.

## 2022-10-05 MED ORDER — CEPHALEXIN 500 MG PO CAPS: 500.0000 mg | ORAL_CAPSULE | Freq: Three times a day (TID) | ORAL | 0 refills | Status: DC

## 2022-10-05 NOTE — Telephone Encounter (Signed)
No orders of the defined types were placed in this encounter.  Meds ordered this encounter  Medications   cephALEXin (KEFLEX) 500 MG capsule    Sig: Take 1 capsule (500 mg total) by mouth 3 (three) times daily.    Dispense:  21 capsule    Refill:  0

## 2022-10-07 ENCOUNTER — Encounter: Payer: Self-pay | Admitting: Family Medicine

## 2022-10-07 IMAGING — MG MM DIGITAL SCREENING BILAT W/ TOMO AND CAD
8 series · 8 of 24 positions shown · non-contrast
Comparison: Previous exam(s).

ACR Breast Density Category a: The breast tissue is almost entirely
fatty.

CLINICAL DATA: Screening.

EXAM:
DIGITAL SCREENING BILATERAL MAMMOGRAM WITH TOMOSYNTHESIS AND CAD
TECHNIQUE: Bilateral screening digital craniocaudal and mediolateral oblique
mammograms were obtained. Bilateral screening digital breast
tomosynthesis was performed. The images were evaluated with
computer-aided detection.

[R CC synth-2D]
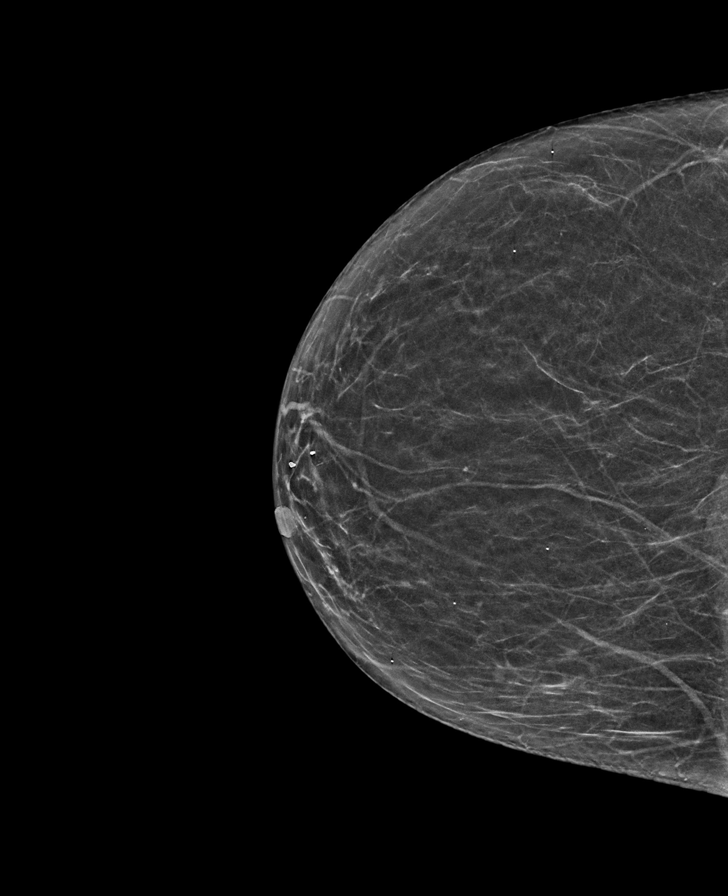

[L MLO synth-2D]
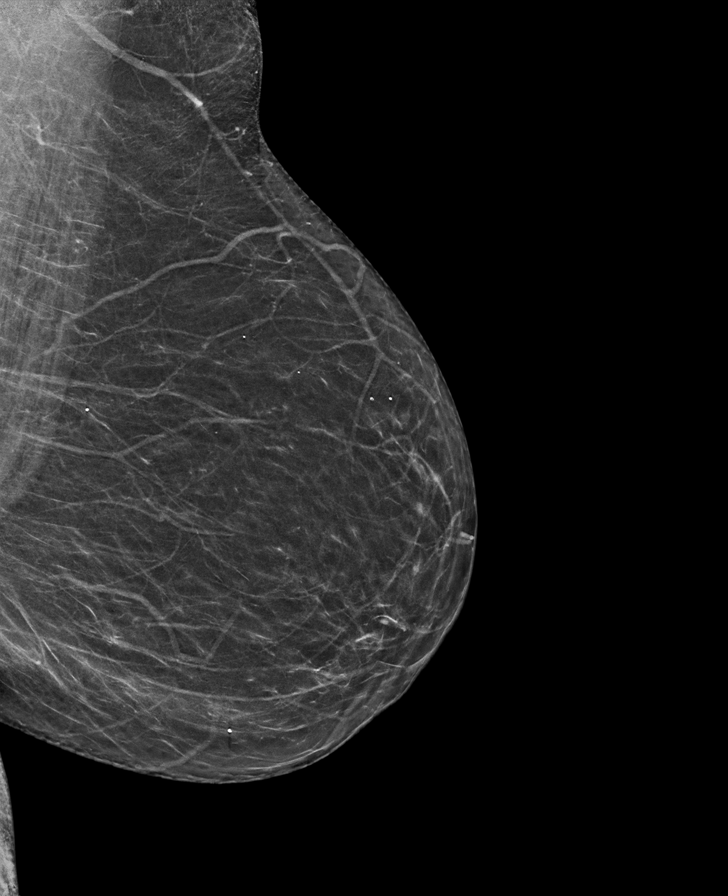

[R MLO synth-2D]
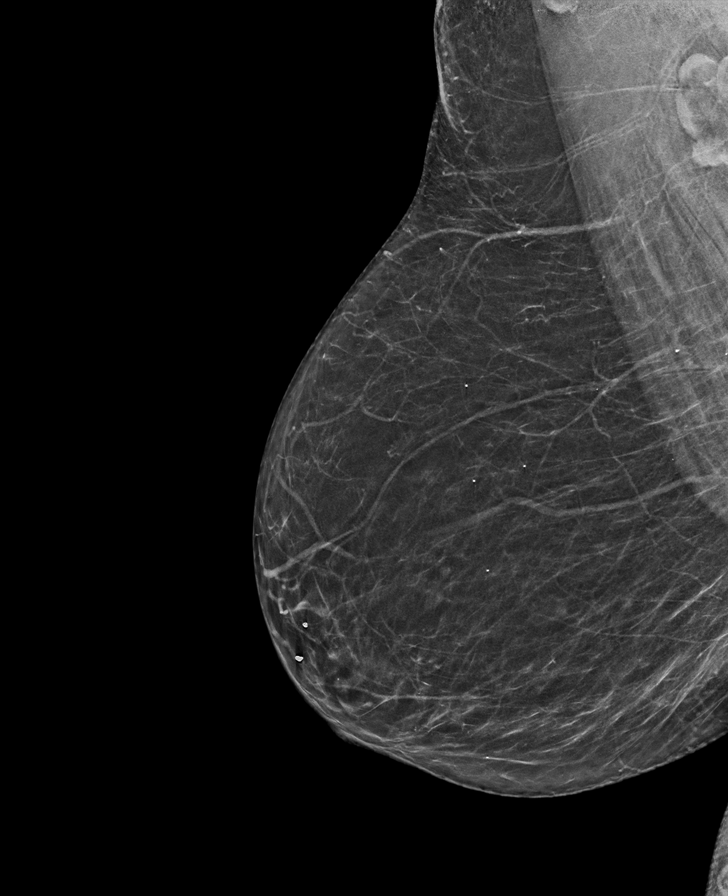

[L CC synth-2D]
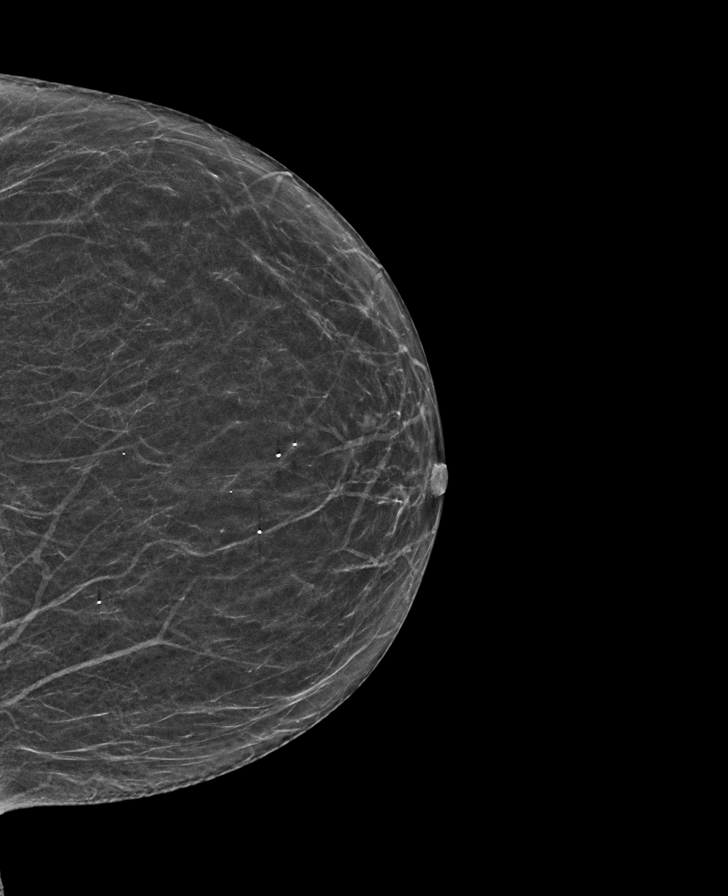

[R MLO tomo · tomo slice 32/63.0]
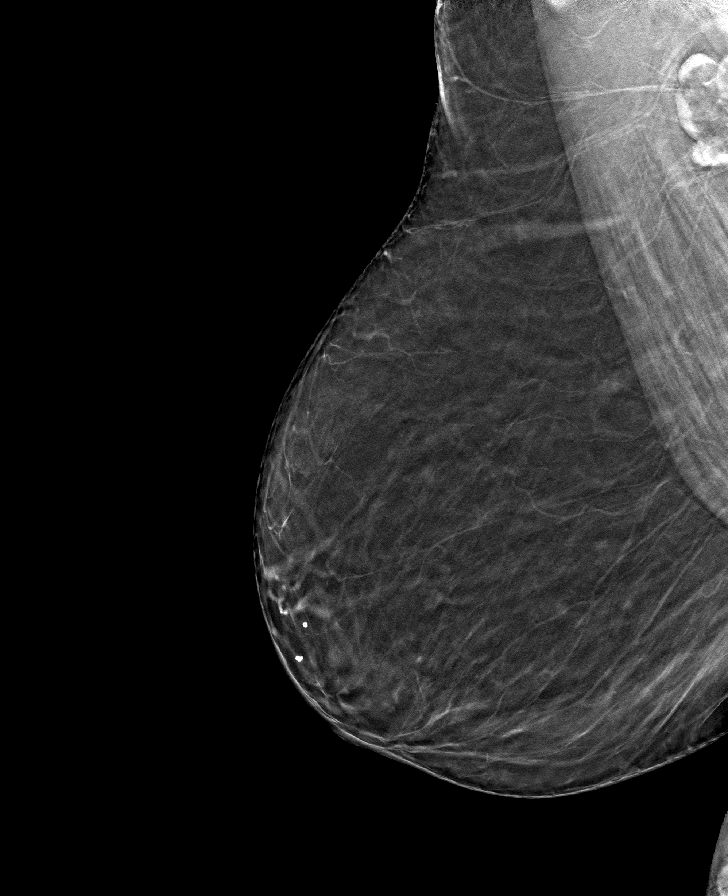

[R CC tomo · tomo slice 28/55.0]
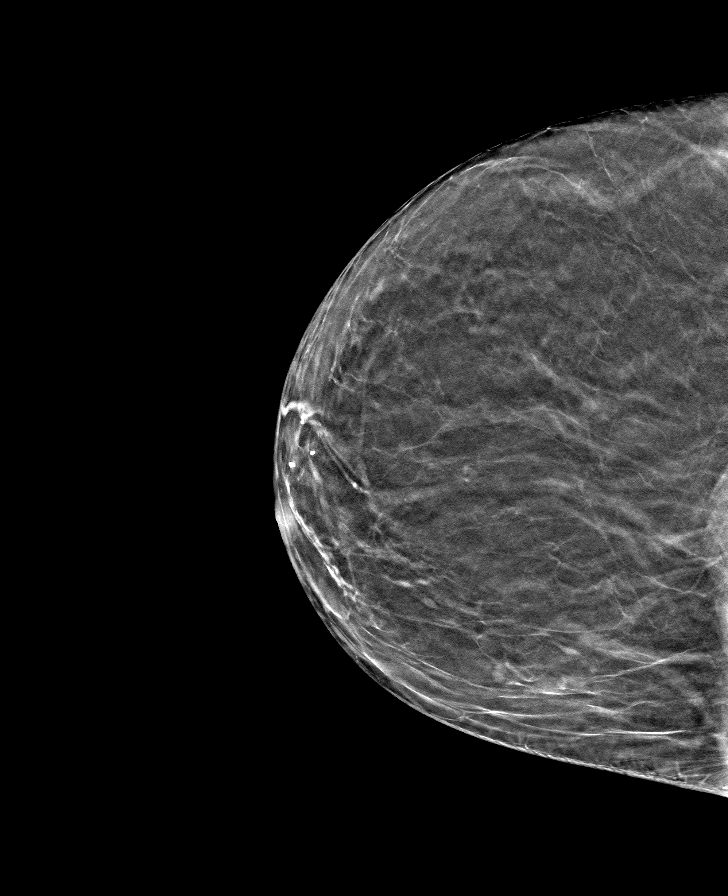

[L CC tomo · tomo slice 27/53.0]
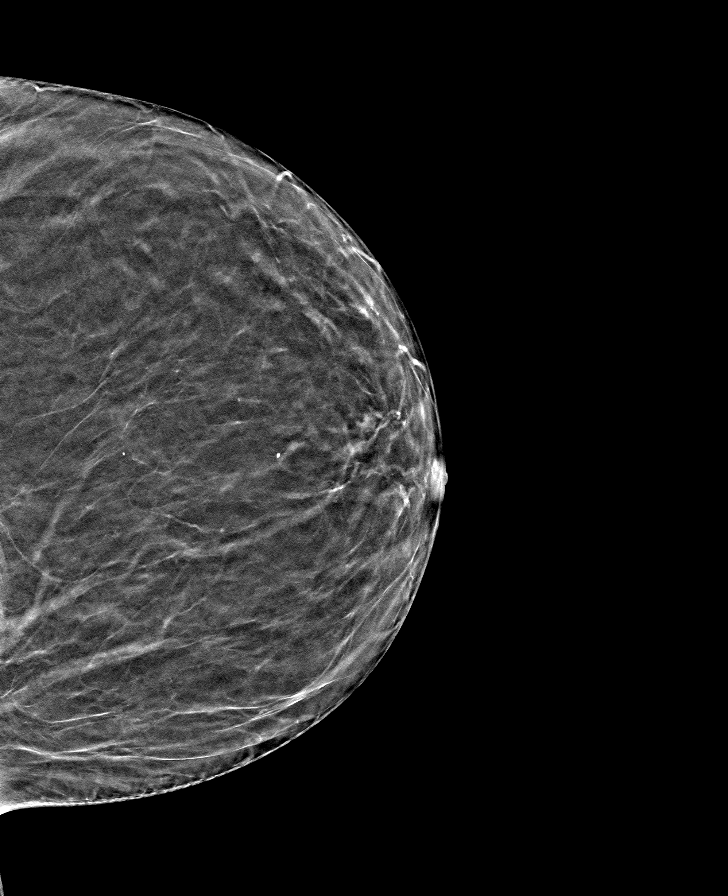

[L MLO tomo · tomo slice 32/63.0]
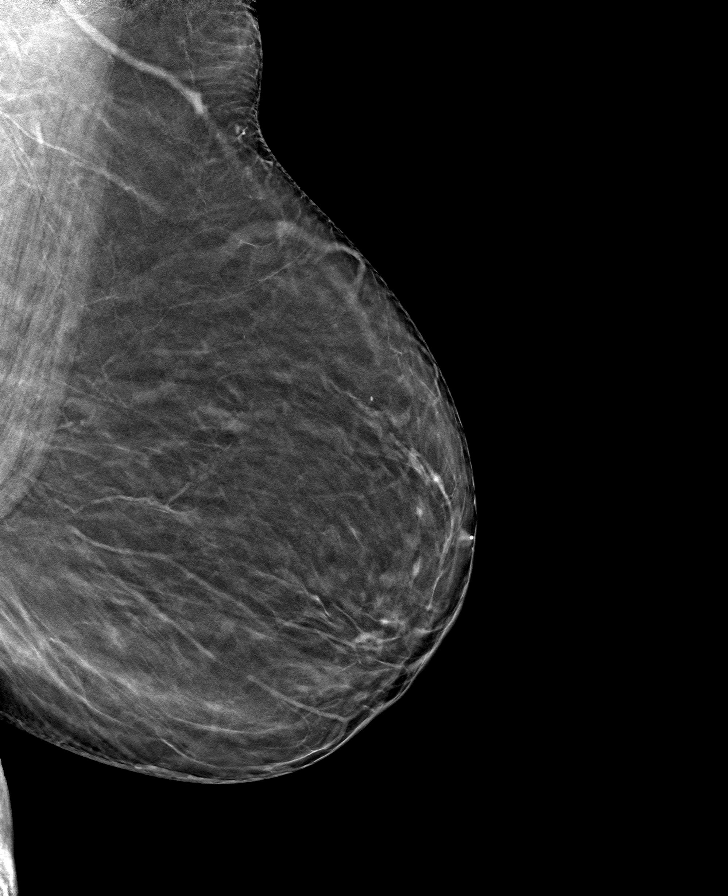

[8 of 24 positions shown; findings below may reference images not displayed]

FINDINGS: There are no findings suspicious for malignancy.
IMPRESSION: No mammographic evidence of malignancy. A result letter of this
screening mammogram will be mailed directly to the patient.

RECOMMENDATION:
Screening mammogram in one year. (Code:0E-3-N98)

BI-RADS CATEGORY  1: Negative.

## 2022-10-09 NOTE — Telephone Encounter (Signed)
Letter in chart. Not sure if we need to fax it somewhere for her

## 2022-11-19 ENCOUNTER — Ambulatory Visit (INDEPENDENT_AMBULATORY_CARE_PROVIDER_SITE_OTHER): Payer: 59 | Admitting: Family Medicine

## 2022-11-19 ENCOUNTER — Encounter: Payer: Self-pay | Admitting: Family Medicine

## 2022-11-19 VITALS — BP 123/63 | HR 66 | Wt 150.0 lb

## 2022-11-19 DIAGNOSIS — E119 Type 2 diabetes mellitus without complications: Secondary | ICD-10-CM

## 2022-11-19 DIAGNOSIS — F418 Other specified anxiety disorders: Secondary | ICD-10-CM

## 2022-11-19 DIAGNOSIS — I10 Essential (primary) hypertension: Secondary | ICD-10-CM

## 2022-11-19 DIAGNOSIS — F325 Major depressive disorder, single episode, in full remission: Secondary | ICD-10-CM | POA: Diagnosis not present

## 2022-11-19 DIAGNOSIS — M069 Rheumatoid arthritis, unspecified: Secondary | ICD-10-CM | POA: Diagnosis not present

## 2022-11-19 MED ORDER — OMEPRAZOLE 20 MG PO CPDR: 20.0000 mg | DELAYED_RELEASE_CAPSULE | Freq: Every day | ORAL | 3 refills | Status: DC

## 2022-11-19 MED ORDER — ONDANSETRON HCL 4 MG PO TABS: 4.0000 mg | ORAL_TABLET | Freq: Three times a day (TID) | ORAL | 1 refills | Status: DC | PRN

## 2022-11-19 MED ORDER — TELMISARTAN-AMLODIPINE 80-5 MG PO TABS: 1.0000 | ORAL_TABLET | Freq: Every day | ORAL | 3 refills | Status: DC

## 2022-11-19 MED ORDER — CITALOPRAM HYDROBROMIDE 20 MG PO TABS
20.0000 mg | ORAL_TABLET | Freq: Every day | ORAL | 1 refills | Status: DC
Start: 2022-11-19 — End: 2023-06-27

## 2022-11-19 NOTE — Assessment & Plan Note (Signed)
Well controlled. Continue current regimen. Follow up in  6 o

## 2022-11-19 NOTE — Progress Notes (Unsigned)
   Established Patient Office Visit  Subjective   Patient ID: Ann Torres, female    DOB: 02-Mar-1956  Age: 67 y.o. MRN: [id number]  Chief Complaint  Patient presents with   Follow-up         HPI   Diabetes - no hypoglycemic events. No wounds or sores that are not healing well. No increased thirst or urination. Checking glucose at home. Taking medications as prescribed without any side effects.  Mood - reports the celexa has really been helpful  Noticed improvement after about a week and then a fw weeks the sleep started to improved.  Still taking the doxepin. Getting about 8-9 urs of sleep and usually only needs 7 to feel good but doesn't feel overly sedated    {History (Optional):23778}  ROS    Objective:     BP 123/63   Pulse 66   Wt 150 lb (68 kg)   SpO2 97%   BMI 25.75 kg/m  {Vitals History (Optional):23777}  Physical Exam Vitals and nursing note reviewed.  Constitutional:      Appearance: She is well-developed.  HENT:     Head: Normocephalic and atraumatic.  Cardiovascular:     Rate and Rhythm: Normal rate and regular rhythm.     Heart sounds: Normal heart sounds.  Pulmonary:     Effort: Pulmonary effort is normal.     Breath sounds: Normal breath sounds.  Skin:    General: Skin is warm and dry.  Neurological:     Mental Status: She is alert and oriented to person, place, and time.  Psychiatric:        Behavior: Behavior normal.      No results found for any visits on 11/19/22.  {Labs (Optional):23779}  The ASCVD Risk score (Arnett DK, et al., 2019) failed to calculate for the following reasons:   The valid total cholesterol range is 130 to 320 mg/dL   Unable to determine if patient is Non-Hispanic African American    Assessment & Plan:   Problem List Items Addressed This Visit       Cardiovascular and Mediastinum   Essential hypertension    Well controlled. Continue current regimen. Follow up in  6o       Relevant Medications    Telmisartan-amLODIPine 80-5 MG TABS     Endocrine   Diabetes mellitus without complication (HCC)    Doing well and mostly in range. Still having occ low in the middle of the night.  Discussed possibility of dec trulicity to 3mg . She will thinking about it.  Still no appetite.  F/u in 3 months.       Relevant Medications   Telmisartan-amLODIPine 80-5 MG TABS     Other   Depression, major, single episode, complete remission (HCC)    Continue current regimen, doing well. Rf for 6 mo.  F/U in 3-4 mo       Relevant Medications   citalopram (CELEXA) 20 MG tablet   Other Visit Diagnoses     Depression with anxiety    -  Primary   Relevant Medications   citalopram (CELEXA) 20 MG tablet      Remind to schedule eye exam   Return in about 3 months (around 02/19/2023) for mood.    Nani Gasser, MD

## 2022-11-19 NOTE — Assessment & Plan Note (Signed)
Doing well and mostly in range. Still having occ low in the middle of the night.  Discussed possibility of dec trulicity to 3mg . She will thinking about it.  Still no appetite.  F/u in 3 months.

## 2022-11-19 NOTE — Assessment & Plan Note (Signed)
Continue current regimen, doing well. Rf for 6 mo.  F/U in 3-4 mo

## 2022-11-22 NOTE — Assessment & Plan Note (Signed)
Followed at [facility] and stable on [place].

## 2022-11-28 ENCOUNTER — Other Ambulatory Visit: Payer: Self-pay | Admitting: Family Medicine

## 2022-11-28 DIAGNOSIS — G4700 Insomnia, unspecified: Secondary | ICD-10-CM

## 2022-11-30 ENCOUNTER — Encounter: Payer: Self-pay | Admitting: Family Medicine

## 2022-11-30 DIAGNOSIS — E119 Type 2 diabetes mellitus without complications: Secondary | ICD-10-CM

## 2022-12-01 MED ORDER — TRULICITY 3 MG/0.5ML ~~LOC~~ SOAJ
3.0000 mg | SUBCUTANEOUS | 3 refills | Status: DC
Start: 2022-12-01 — End: 2023-06-27

## 2022-12-11 ENCOUNTER — Encounter: Payer: Self-pay | Admitting: Family Medicine

## 2022-12-13 MED ORDER — FREESTYLE LIBRE 3 SENSOR MISC: 99 refills | Status: DC

## 2023-01-16 DIAGNOSIS — L299 Pruritus, unspecified: Secondary | ICD-10-CM | POA: Diagnosis not present

## 2023-01-16 DIAGNOSIS — R21 Rash and other nonspecific skin eruption: Secondary | ICD-10-CM | POA: Diagnosis not present

## 2023-01-17 ENCOUNTER — Encounter: Payer: Self-pay | Admitting: Family Medicine

## 2023-02-16 ENCOUNTER — Encounter: Payer: Self-pay | Admitting: Family Medicine

## 2023-02-16 DIAGNOSIS — E119 Type 2 diabetes mellitus without complications: Secondary | ICD-10-CM

## 2023-02-16 MED ORDER — FREESTYLE LIBRE 2 SENSOR MISC
0 refills | Status: DC
Start: 2023-02-16 — End: 2023-05-01

## 2023-02-17 ENCOUNTER — Other Ambulatory Visit: Payer: Self-pay | Admitting: Family Medicine

## 2023-02-17 DIAGNOSIS — Z1231 Encounter for screening mammogram for malignant neoplasm of breast: Secondary | ICD-10-CM

## 2023-02-21 ENCOUNTER — Encounter: Payer: Self-pay | Admitting: Family Medicine

## 2023-02-21 ENCOUNTER — Ambulatory Visit (INDEPENDENT_AMBULATORY_CARE_PROVIDER_SITE_OTHER): Payer: 59 | Admitting: Family Medicine

## 2023-02-21 VITALS — BP 127/73 | HR 79 | Ht 64.0 in | Wt 157.0 lb

## 2023-02-21 DIAGNOSIS — F325 Major depressive disorder, single episode, in full remission: Secondary | ICD-10-CM | POA: Diagnosis not present

## 2023-02-21 DIAGNOSIS — R0789 Other chest pain: Secondary | ICD-10-CM | POA: Diagnosis not present

## 2023-02-21 DIAGNOSIS — Z23 Encounter for immunization: Secondary | ICD-10-CM | POA: Diagnosis not present

## 2023-02-21 DIAGNOSIS — E119 Type 2 diabetes mellitus without complications: Secondary | ICD-10-CM

## 2023-02-21 DIAGNOSIS — Z7985 Long-term (current) use of injectable non-insulin antidiabetic drugs: Secondary | ICD-10-CM | POA: Diagnosis not present

## 2023-02-21 DIAGNOSIS — I10 Essential (primary) hypertension: Secondary | ICD-10-CM

## 2023-02-21 LAB — POCT GLYCOSYLATED HEMOGLOBIN (HGB A1C): Hemoglobin A1C: 6.9 % — AB (ref 4.0–5.6)

## 2023-02-21 NOTE — Assessment & Plan Note (Signed)
Continue current regimen.    Follow-up in 6 months.

## 2023-02-21 NOTE — Progress Notes (Addendum)
Established Patient Office Visit  Subjective   Patient ID: Ann Torres, female    DOB: 1955-09-06  Age: 67 y.o. MRN: [id number]  Chief Complaint  Patient presents with   Diabetes   Medical Management of Chronic Issues    HPI  Hypertension- Pt denies SOB, dizziness, or heart palpitations.  Taking meds as directed w/o problems.  Denies medication side effects.  Reports some occasional chest pain.  Even though she did have a cardiology workup with [facility] a year ago she still gets the chest pain occasionally.  She would like to establish with a new cardiologist just to make sure that everything is okay.  Diabetes - no hypoglycemic events. No wounds or sores that are not healing well. No increased thirst or urination. Checking glucose at home. Taking medications as prescribed without any side effects. Eye exam scheduled   Mammogram scheduled.   F/U depression with anxiety.  This got back from up [place] visiting family and had a good time.  She is doing well with the citalopram no specific concerns or problems.   She does have an eye appointment coming up she has had a few floaters that are moving around and a little bit more of a yellow haze in her right eye.  She will have a copy of the report sent does when she gets it done.  ROS    Objective:     BP 127/73   Pulse 79   Ht 5\' 4"  (1.626 m)   Wt 157 lb (71.2 kg)   SpO2 100%   BMI 26.95 kg/m     Physical Exam Vitals and nursing note reviewed.  Constitutional:      Appearance: Normal appearance.  HENT:     Head: Normocephalic and atraumatic.  Eyes:     Conjunctiva/sclera: Conjunctivae normal.  Cardiovascular:     Rate and Rhythm: Normal rate and regular rhythm.  Pulmonary:     Effort: Pulmonary effort is normal.     Breath sounds: Normal breath sounds.  Skin:    General: Skin is warm and dry.  Neurological:     Mental Status: She is alert.  Psychiatric:        Mood and Affect: Mood normal.      Results for  orders placed or performed in visit on 02/21/23  POCT HgB A1C  Result Value Ref Range   Hemoglobin A1C 6.9 (A) 4.0 - 5.6 %   HbA1c POC (<> result, manual entry)     HbA1c, POC (prediabetic range)     HbA1c, POC (controlled diabetic range)         The ASCVD Risk score (Arnett DK, et al., 2019) failed to calculate for the following reasons:   The valid total cholesterol range is 130 to 320 mg/dL   Unable to determine if patient is Non-Hispanic African American    Assessment & Plan:   Problem List Items Addressed This Visit       Cardiovascular and Mediastinum   Essential hypertension    Well controlled. Continue current regimen. Follow up in  62mo       Relevant Orders   Lipid panel     Endocrine   Diabetes mellitus without complication (HCC) - Primary    Well controlled. Continue current regimen. Follow up in  3-4 months.       Relevant Orders   POCT HgB A1C (Completed)   Lipid panel     Other   Depression, major, single episode, complete  remission (HCC)    Continue current regimen.  Follow-up in 6 months.      Other Visit Diagnoses     Encounter for immunization       Relevant Orders   Flu Vaccine Trivalent High Dose (Fluad) (Completed)   Atypical chest pain       Relevant Orders   Ambulatory referral to Cardiology      Atypical chest pain-will refer to Dr. Olga Millers in [place].  Please see additional lab results and testing performed at [facility].  Return in about 4 months (around 06/24/2023) for Diabetes follow-up.    Nani Gasser, MD

## 2023-02-21 NOTE — Assessment & Plan Note (Signed)
Well controlled. Continue current regimen. Follow up in  6 mo  

## 2023-02-21 NOTE — Assessment & Plan Note (Signed)
Well controlled. Continue current regimen. Follow up in  3-4 months.  

## 2023-02-24 DIAGNOSIS — E119 Type 2 diabetes mellitus without complications: Secondary | ICD-10-CM | POA: Diagnosis not present

## 2023-02-24 DIAGNOSIS — H43813 Vitreous degeneration, bilateral: Secondary | ICD-10-CM | POA: Diagnosis not present

## 2023-02-24 DIAGNOSIS — H26493 Other secondary cataract, bilateral: Secondary | ICD-10-CM | POA: Diagnosis not present

## 2023-02-24 DIAGNOSIS — H40053 Ocular hypertension, bilateral: Secondary | ICD-10-CM | POA: Diagnosis not present

## 2023-02-24 DIAGNOSIS — H524 Presbyopia: Secondary | ICD-10-CM | POA: Diagnosis not present

## 2023-02-28 DIAGNOSIS — E119 Type 2 diabetes mellitus without complications: Secondary | ICD-10-CM | POA: Diagnosis not present

## 2023-02-28 DIAGNOSIS — I10 Essential (primary) hypertension: Secondary | ICD-10-CM | POA: Diagnosis not present

## 2023-03-01 DIAGNOSIS — M0579 Rheumatoid arthritis with rheumatoid factor of multiple sites without organ or systems involvement: Secondary | ICD-10-CM | POA: Diagnosis not present

## 2023-03-01 DIAGNOSIS — Z79899 Other long term (current) drug therapy: Secondary | ICD-10-CM | POA: Diagnosis not present

## 2023-03-01 LAB — LIPID PANEL
Chol/HDL Ratio: 2.5 ratio (ref 0.0–4.4)
Cholesterol, Total: 150 mg/dL (ref 100–199)
HDL: 60 mg/dL (ref >39)
LDL Chol Calc (NIH): 68 mg/dL (ref 0–99)
Triglycerides: 126 mg/dL (ref 0–149)
VLDL Cholesterol Cal: 22 mg/dL (ref 5–40)

## 2023-03-10 ENCOUNTER — Other Ambulatory Visit: Payer: Self-pay | Admitting: Family Medicine

## 2023-03-17 ENCOUNTER — Encounter: Payer: Self-pay | Admitting: Family Medicine

## 2023-03-17 NOTE — Telephone Encounter (Signed)
Care team updated and letter sent for eye exam notes.

## 2023-03-23 ENCOUNTER — Encounter: Payer: Self-pay | Admitting: Family Medicine

## 2023-03-23 ENCOUNTER — Ambulatory Visit: Payer: 59 | Admitting: Family Medicine

## 2023-03-23 VITALS — Ht 64.0 in | Wt 159.0 lb

## 2023-03-23 DIAGNOSIS — Z0000 Encounter for general adult medical examination without abnormal findings: Secondary | ICD-10-CM | POA: Diagnosis not present

## 2023-03-23 NOTE — Progress Notes (Signed)
Subjective:   Ann Torres is a 67 y.o. female who presents for Medicare Annual (Subsequent) preventive examination.  Visit Complete: Virtual I connected with  Ann Torres on 03/23/23 by a audio enabled telemedicine application and verified that I am speaking with the correct person using two identifiers.  Patient Location: Home  Provider Location: Office/Clinic  I discussed the limitations of evaluation and management by telemedicine. The patient expressed understanding and agreed to proceed.  Vital Signs: Because this visit was a virtual/telehealth visit, some criteria may be missing or patient reported. Any vitals not documented were not able to be obtained and vitals that have been documented are patient reported.  Patient Medicare AWV questionnaire was completed by the patient on 03/23/23; I have confirmed that all information answered by patient is correct and no changes since this date.  Cardiac Risk Factors include: advanced age (>20men, >52 women);diabetes mellitus;dyslipidemia;hypertension     Objective:    Today's Vitals   03/23/23 1037  Weight: 159 lb (72.1 kg)  Height: 5\' 4"  (1.626 m)  PainSc: 8    Body mass index is 27.29 kg/m.     03/23/2023   10:55 AM 03/19/2022   10:21 AM 08/26/2021    8:55 AM 01/14/2021    9:40 AM 01/10/2021    8:12 AM 03/17/2020    2:36 PM 02/01/2017    1:40 PM  Advanced Directives  Does Patient Have a Medical Advance Directive? Yes Yes Yes No No No No  Type of Advance Directive -- Living will Healthcare Power of [place];Living will      Does patient want to make changes to medical advance directive? No - Patient declined No - Patient declined No - Patient declined      Copy of Healthcare Power of Attorney in Chart?   No - copy requested      Would patient like information on creating a medical advance directive?    No - Patient declined No - Patient declined Yes (ED - Information included in AVS) No - Patient declined    Current  Medications (verified) Outpatient Encounter Medications as of 03/23/2023  Medication Sig   Abatacept 125 MG/ML SOAJ Inject 1 Dose into the skin once a week. Sundays   albuterol (VENTOLIN HFA) 108 (90 Base) MCG/ACT inhaler Inhale 2 puffs into the lungs every 6 (six) hours as needed for wheezing or shortness of breath.   atorvastatin (LIPITOR) 20 MG tablet Take 1 tablet (20 mg total) by mouth at bedtime.   Calcium-Vitamin D-Vitamin K (VIACTIV CALCIUM PLUS D) 650-12.5-40 MG-MCG-MCG CHEW Chew 2 tablets by mouth in the morning and at bedtime.   citalopram (CELEXA) 20 MG tablet Take 1 tablet (20 mg total) by mouth daily.   Continuous Glucose Sensor (FREESTYLE LIBRE 2 SENSOR) MISC 6 (six) sensors/90 day supply 4 refills per year. DX:E11.9 Fax: [phone]   doxepin (SINEQUAN) 10 MG capsule Take 1 capsule (10 mg total) by mouth at bedtime.   Dulaglutide (TRULICITY) 3 MG/0.5ML SOPN Inject 3 mg as directed once a week.   estradiol (ESTRACE VAGINAL) 0.1 MG/GM vaginal cream Place 1 Applicatorful vaginally at bedtime. For 2 weeks then 3 times a week.   insulin lispro (HUMALOG) 100 UNIT/ML injection Inject 0.05-0.1 mLs (5-10 Units total) into the skin 3 (three) times daily before meals.   INSULIN SYRINGE 1CC/29G 29G X 1/2" 1 ML MISC Use up to TID for insulin. Ok to sub if needed as covered by insurance.   omeprazole (PRILOSEC) 20 MG  capsule Take 1 capsule (20 mg total) by mouth daily.   ondansetron (ZOFRAN) 4 MG tablet Take 1 tablet (4 mg total) by mouth every 8 (eight) hours as needed for nausea or vomiting.   Telmisartan-amLODIPine 80-5 MG TABS Take 1 tablet by mouth daily.   triamcinolone cream (KENALOG) 0.1 % Apply 1 Application topically 2 (two) times daily.   aspirin EC 81 MG tablet Take 81 mg by mouth every Monday, Wednesday, and Friday. Swallow whole. (Patient not taking: Reported on 03/23/2023)   EPINEPHrine 0.3 mg/0.3 mL IJ SOAJ injection Inject 0.3 mg into the muscle as needed for anaphylaxis.  (Patient not taking: Reported on 03/23/2023)   gabapentin (NEURONTIN) 300 MG capsule Take 1 capsule (300 mg total) by mouth 2 (two) times daily. One tab PO qHS for a week, then BID for a week, then TID. May double weekly to a max of 3,600mg /day (Patient not taking: Reported on 03/23/2023)   meclizine (ANTIVERT) 25 MG tablet Take 1 tablet (25 mg total) by mouth 3 (three) times daily as needed for dizziness. (Patient not taking: Reported on 03/23/2023)   No facility-administered encounter medications on file as of 03/23/2023.    Allergies (verified) Bee venom, Lisinopril, Azathioprine, Celecoxib, Metformin and related, Hydrochlorothiazide, Red dye #40 (allura red), Enbrel [etanercept], Farxiga [dapagliflozin], Hydroxychloroquine, Methotrexate derivatives, and Tape   History: Past Medical History:  Diagnosis Date   Chronic rheumatic arthritis (HCC)    Diabetes mellitus without complication (HCC)    Hyperlipidemia    Hypertension    Past Surgical History:  Procedure Laterality Date   BACK SURGERY     BILATERAL OOPHORECTOMY     BREAST BIOPSY     in pts 30s   BREAST CYST EXCISION     Pt unsure which breast & unable to find a scar   CESAREAN SECTION     x 3   CHOLECYSTECTOMY     TOTAL ABDOMINAL HYSTERECTOMY     TUBAL LIGATION     Family History  Problem Relation Age of Onset   Colon cancer Mother    Heart attack Father 27   Social History   Socioeconomic History   Marital status: Divorced    Spouse name: Not on file   Number of children: 3   Years of education: 2 yr college   Highest education level: Associate degree: occupational, Scientist, product/process development, or vocational program  Occupational History   Occupation: retired  Tobacco Use   Smoking status: Former    Current packs/day: 0.00    Types: Cigarettes    Quit date: 08/01/2017    Years since quitting: 5.6   Smokeless tobacco: Never  Substance and Sexual Activity   Alcohol use: No   Drug use: No   Sexual activity: Never  Other  Topics Concern   Not on file  Social History Narrative   Lives alone. She has three children. She enjoys playing tennis, sewing, painting stones and water aerobics.    Social Determinants of Health   Financial Resource Strain: Low Risk  (03/23/2023)   Overall Financial Resource Strain (CARDIA)    Difficulty of Paying Living Expenses: Not hard at all  Food Insecurity: No Food Insecurity (03/23/2023)   Hunger Vital Sign    Worried About Running Out of Food in the Last Year: Never true    Ran Out of Food in the Last Year: Never true  Transportation Needs: No Transportation Needs (03/23/2023)   PRAPARE - Administrator, Civil Service (Medical): No  Lack of Transportation (Non-Medical): No  Physical Activity: Sufficiently Active (03/23/2023)   Exercise Vital Sign    Days of Exercise per Week: 7 days    Minutes of Exercise per Session: 120 min  Recent Concern: Physical Activity - Insufficiently Active (02/17/2023)   Exercise Vital Sign    Days of Exercise per Week: 7 days    Minutes of Exercise per Session: 20 min  Stress: Stress Concern Present (03/23/2023)   [employer] of Occupational Health - Occupational Stress Questionnaire    Feeling of Stress : To some extent  Social Connections: Moderately Isolated (03/23/2023)   Social Connection and Isolation Panel [NHANES]    Frequency of Communication with Friends and Family: More than three times a week    Frequency of Social Gatherings with Friends and Family: Twice a week    Attends Religious Services: Never    Database administrator or Organizations: Yes    Attends Engineer, structural: More than 4 times per year    Marital Status: Divorced    Tobacco Counseling Counseling given: Not Answered   Clinical Intake:  Pre-visit preparation completed: No  Pain : 0-10 Pain Score: 8  Pain Type: Chronic pain Pain Location: Hip Pain Orientation: Right, Left Pain Descriptors / Indicators: Constant,  Aching Pain Onset: More than a month ago Pain Frequency: Constant Pain Relieving Factors: resting and tylenol Effect of Pain on Daily Activities: she is active, except on rainy days and rests.  Pain Relieving Factors: resting and tylenol  BMI - recorded: 27.3 Nutritional Status: BMI 25 -29 Overweight Nutritional Risks: None Diabetes: No  How often do you need to have someone help you when you read instructions, pamphlets, or other written materials from your doctor or pharmacy?: 1 - Never What is the last grade level you completed in school?: 14  Interpreter Needed?: No      Activities of Daily Living    03/23/2023   10:41 AM  In your present state of health, do you have any difficulty performing the following activities:  Hearing? 1  Comment hearing loss due to medication history, right hear 0% hearing, left 50%.  Vision? 0  Difficulty concentrating or making decisions? 0  Walking or climbing stairs? 1  Comment has RA  Dressing or bathing? 0  Doing errands, shopping? 0  Preparing Food and eating ? N  Using the Toilet? N  In the past six months, have you accidently leaked urine? N  Do you have problems with loss of bowel control? N  Managing your Medications? N  Managing your Finances? N  Housekeeping or managing your Housekeeping? N    Patient Care Team: [name], MD as PCP - General (Family Medicine) [name], [facility] as Pharmacist (Pharmacist) Candyce Churn, OD (Optometry) Dr. Therese Sarah, Rheumatology   Indicate any recent Medical Services you may have received from other than [facility] providers in the past year (date may be approximate).     Assessment:   This is a routine wellness examination for Dhvani.  Hearing/Vision screen Hearing Screening - Comments:: Unable to test, has hearing loss. Vision Screening - Comments:: Unable to test. Wears glasses   Goals Addressed             This Visit's Progress    DIET - INCREASE WATER  INTAKE       Increase daily water intake:  at least 64 ounces per day.       Depression Screen    03/23/2023  10:53 AM 03/23/2023   10:52 AM 02/21/2023    8:29 AM 11/19/2022    3:49 PM 10/04/2022    9:23 AM 03/22/2022    7:34 AM 03/19/2022   10:16 AM  PHQ 2/9 Scores  PHQ - 2 Score 2 2 0 1 4 2 2   PHQ- 9 Score  3 1 3 17 7 2     Fall Risk    03/23/2023   10:56 AM 02/21/2023    8:28 AM 11/19/2022    3:49 PM 10/04/2022    9:23 AM 06/22/2022    8:15 AM  Fall Risk   Falls in the past year? 0 0 0 0 0  Number falls in past yr: 0 0 0 0 0  Injury with Fall? 0 0 0 0 0  Risk for fall due to : No Fall Risks No Fall Risks No Fall Risks No Fall Risks No Fall Risks  Follow up  Falls evaluation completed Falls evaluation completed Falls evaluation completed Falls evaluation completed    MEDICARE RISK AT HOME: Medicare Risk at Home Any stairs in or around the home?: Yes If so, are there any without handrails?: Yes Home free of loose throw rugs in walkways, pet beds, electrical cords, etc?: Yes Adequate lighting in your home to reduce risk of falls?: Yes Life alert?: No Use of a cane, walker or w/c?: No Grab bars in the bathroom?: Yes Shower chair or bench in shower?: No Elevated toilet seat or a handicapped toilet?: Yes  TIMED UP AND GO:  Was the test performed?  No    Cognitive Function:    12/06/2019    8:44 AM  MMSE - Mini Mental State Exam  Orientation to time 4  Orientation to Place 5  Registration 3  Attention/ Calculation 5  Recall 3  Language- name 2 objects 2  Language- repeat 1  Language- follow 3 step command 3  Language- read & follow direction 1  Write a sentence 1  Copy design 1  Total score 29        03/23/2023   10:57 AM 03/19/2022   10:26 AM 01/10/2021    8:16 AM  6CIT Screen  What Year? 0 points 0 points 0 points  What month? 0 points 0 points 0 points  What time? 0 points 0 points 0 points  Count back from 20 0 points 0 points 0 points  Months in  reverse 0 points 0 points 0 points  Repeat phrase 0 points 0 points 0 points  Total Score 0 points 0 points 0 points    Immunizations Immunization History  Administered Date(s) Administered   Fluad Quad(high Dose 65+) 01/30/2021, 01/26/2022   Fluad Trivalent(High Dose 65+) 02/21/2023   Influenza,inj,Quad PF,6+ Mos 02/01/2017, 01/24/2018, 01/15/2019, 01/31/2020   Janssen (J&J) SARS-COV-2 Vaccination 07/21/2019   Moderna Covid-19 Fall Seasonal Vaccine 59yrs & older 05/14/2022   [name](Purple Top)SARS-COV-2 Vaccination 02/20/2020, 08/05/2020, 01/30/2021   PNEUMOCOCCAL CONJUGATE-20 03/30/2021   [name](Comirnaty)Fall Seasonal Vaccine 12 years and older 02/25/2023   Pneumococcal Conjugate-13 03/06/2020   Respiratory Syncytial Virus Vaccine,Recomb Aduvanted(Arexvy) 05/14/2022   Tdap 07/01/2012    TDAP status: Due, Education has been provided regarding the importance of this vaccine. Advised may receive this vaccine at local pharmacy or Health Dept. Aware to provide a copy of the vaccination record if obtained from local pharmacy or Health Dept. Verbalized acceptance and understanding.  Flu Vaccine status: Up to date  Pneumococcal vaccine status: Up to date  Covid-19 vaccine status: Completed vaccines  Qualifies for Shingles Vaccine? Yes   Zostavax completed No   Shingrix Completed?: No.    Education has been provided regarding the importance of this vaccine. Patient has been advised to call insurance company to determine out of pocket expense if they have not yet received this vaccine. Advised may also receive vaccine at local pharmacy or Health Dept. Verbalized acceptance and understanding.  Screening Tests Health Maintenance  Topic Date Due   Hepatitis C Screening  Never done   DTaP/Tdap/Td (2 - Td or Tdap) 07/02/2022   OPHTHALMOLOGY EXAM  10/16/2022   Zoster Vaccines- Shingrix (1 of 2) 11/15/2023 (Originally 10/11/1974)   COVID-19 Vaccine (7 - 2023-24 season) 04/22/2023   Diabetic  kidney evaluation - eGFR measurement  06/23/2023   Diabetic kidney evaluation - Urine ACR  06/23/2023   FOOT EXAM  06/23/2023   HEMOGLOBIN A1C  08/22/2023   MAMMOGRAM  03/03/2024   Medicare Annual Wellness (AWV)  03/22/2024   Colonoscopy  05/17/2024   Pneumonia Vaccine 46+ Years old  Completed   INFLUENZA VACCINE  Completed   DEXA SCAN  Completed   HPV VACCINES  Aged Out    Health Maintenance  Health Maintenance Due  Topic Date Due   Hepatitis C Screening  Never done   DTaP/Tdap/Td (2 - Td or Tdap) 07/02/2022   OPHTHALMOLOGY EXAM  10/16/2022    Colorectal cancer screening: Type of screening: Colonoscopy. Completed 05/18/2019. Repeat every 5 years  Mammogram status: Completed 03/03/2022. Repeat every year Scheduled 05/02/23.   Bone Density status: Completed 08/04/2020. Results reflect: Bone density results: OSTEOPENIA. Repeat every 5 years.  Lung Cancer Screening: (Low Dose CT Chest recommended if Age 104-80 years, 20 pack-year currently smoking OR have quit w/in 15years.) does not qualify.   Lung Cancer Screening Referral: has 8 pack history.   Additional Screening:  Hepatitis C Screening: does qualify; Completed 2014.   Vision Screening: Recommended annual ophthalmology exams for early detection of glaucoma and other disorders of the eye. Is the patient up to date with their annual eye exam?  Yes  Who is the provider or what is the name of the office in which the patient attends annual eye exams? [facility]  If pt is not established with a provider, would they like to be referred to a provider to establish care? No .   Dental Screening: Recommended annual dental exams for proper oral hygiene  Diabetic Foot Exam: Diabetic Foot Exam: Completed 06/22/22  Community Resource Referral / Chronic Care Management: CRR required this visit?  No   CCM required this visit?  No     Plan:     I have personally reviewed and noted the following in the patient's chart:    Medical and social history Use of alcohol, tobacco or illicit drugs  Current medications and supplements including opioid prescriptions. Patient is not currently taking opioid prescriptions. Functional ability and status Nutritional status Physical activity Advanced directives List of other physicians Hospitalizations, surgeries, and ER visits in previous 12 months: no  Vitals Screenings to include cognitive, depression, and falls Referrals and appointments: no referrals needed today.   In addition, I have reviewed and discussed with patient certain preventive protocols, quality metrics, and best practice recommendations. A written personalized care plan for preventive services as well as general preventive health recommendations were provided to patient.     Novella Olive, FNP   03/23/2023   After Visit Summary: (MyChart) Due to this being a telephonic visit, the after visit  summary with patients personalized plan was offered to patient via MyChart   Follow-up with PCP as scheduled. Recommend T dap and Shingrix at local pharmacy.

## 2023-03-29 IMAGING — DX DG SHOULDER 2+V*L*
3 series · 3 of 3 positions shown · non-contrast
Comparison: None.

CLINICAL DATA: Left shoulder pain x 4 months. Humeral head area
pain x 6 months.

EXAM:
LEFT SHOULDER - 2+ VIEW

[shoulder y view]
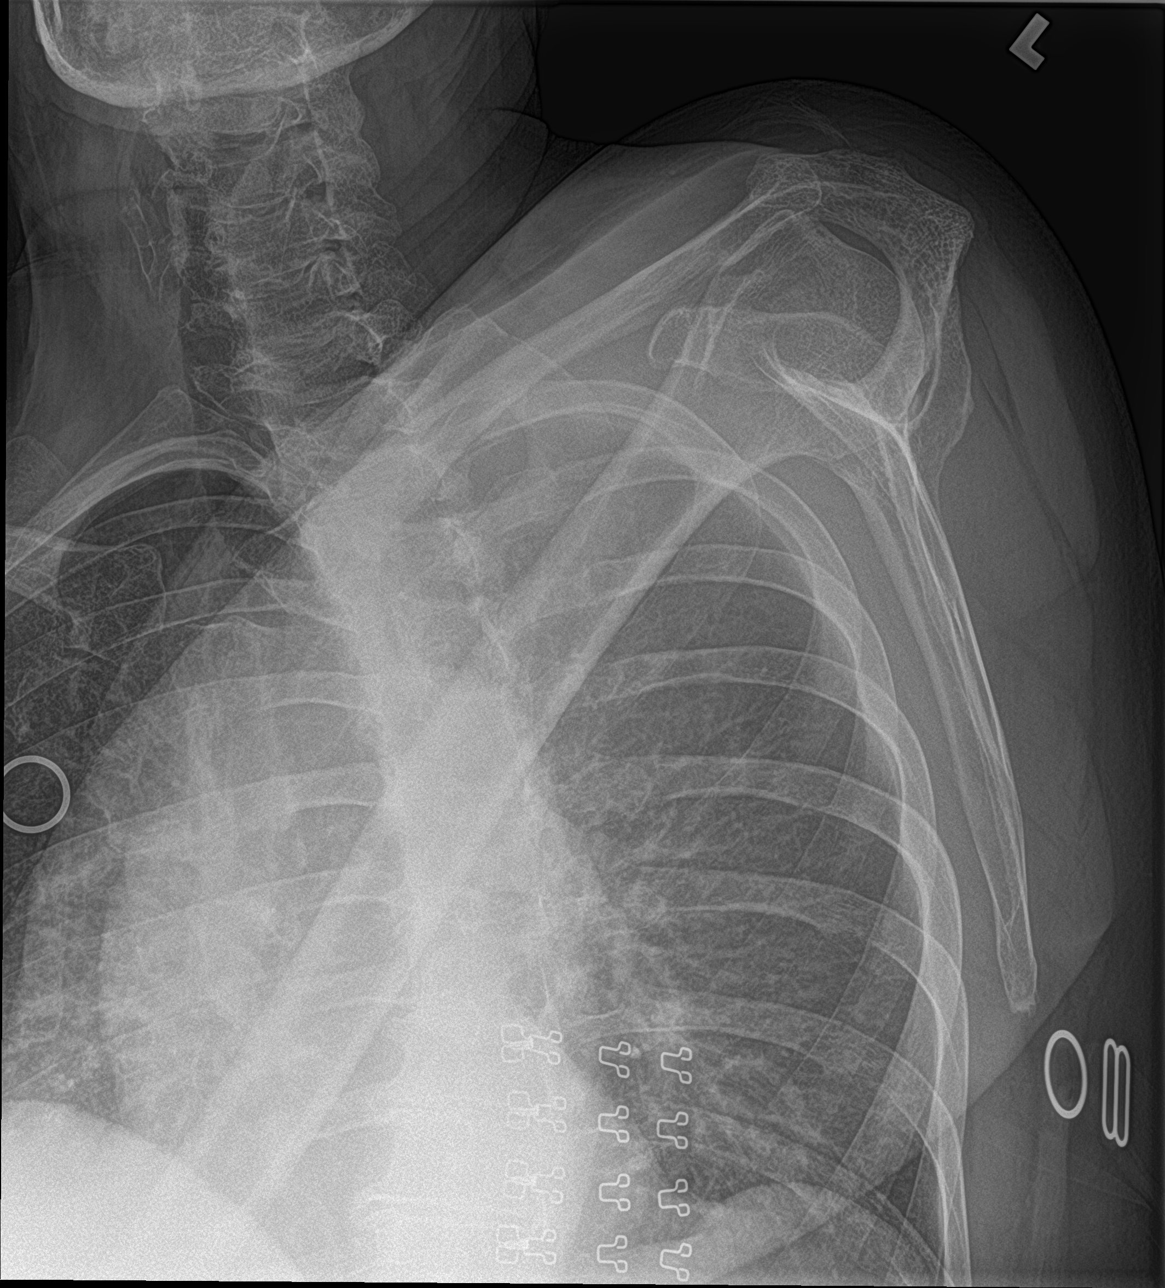

[shoulder axillary]
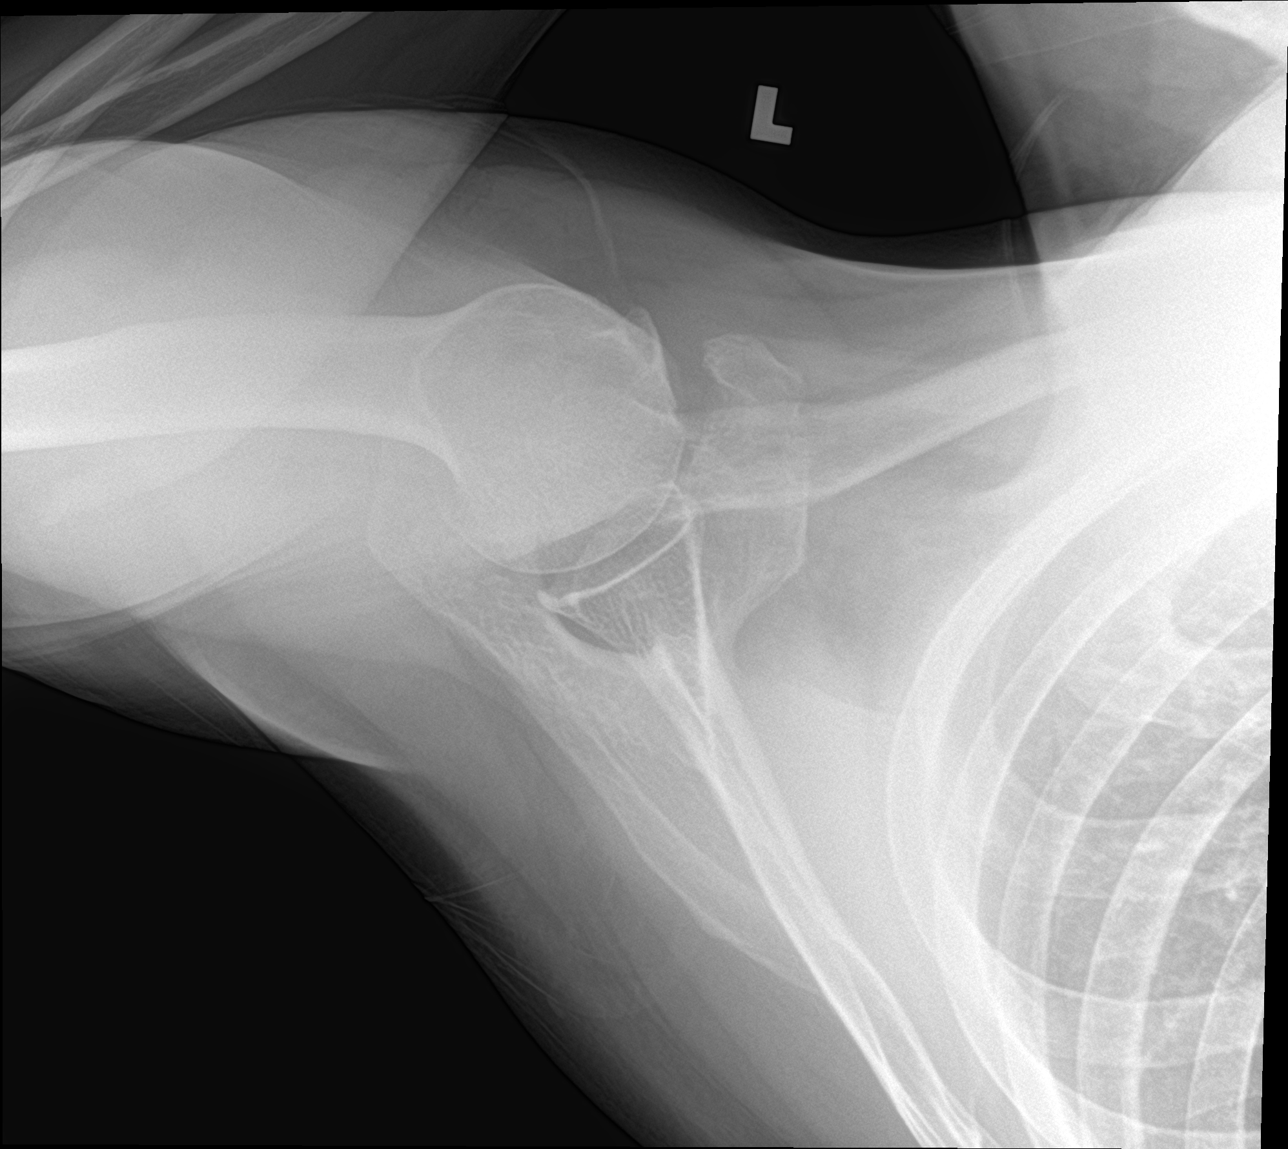

[shoulder grashey]
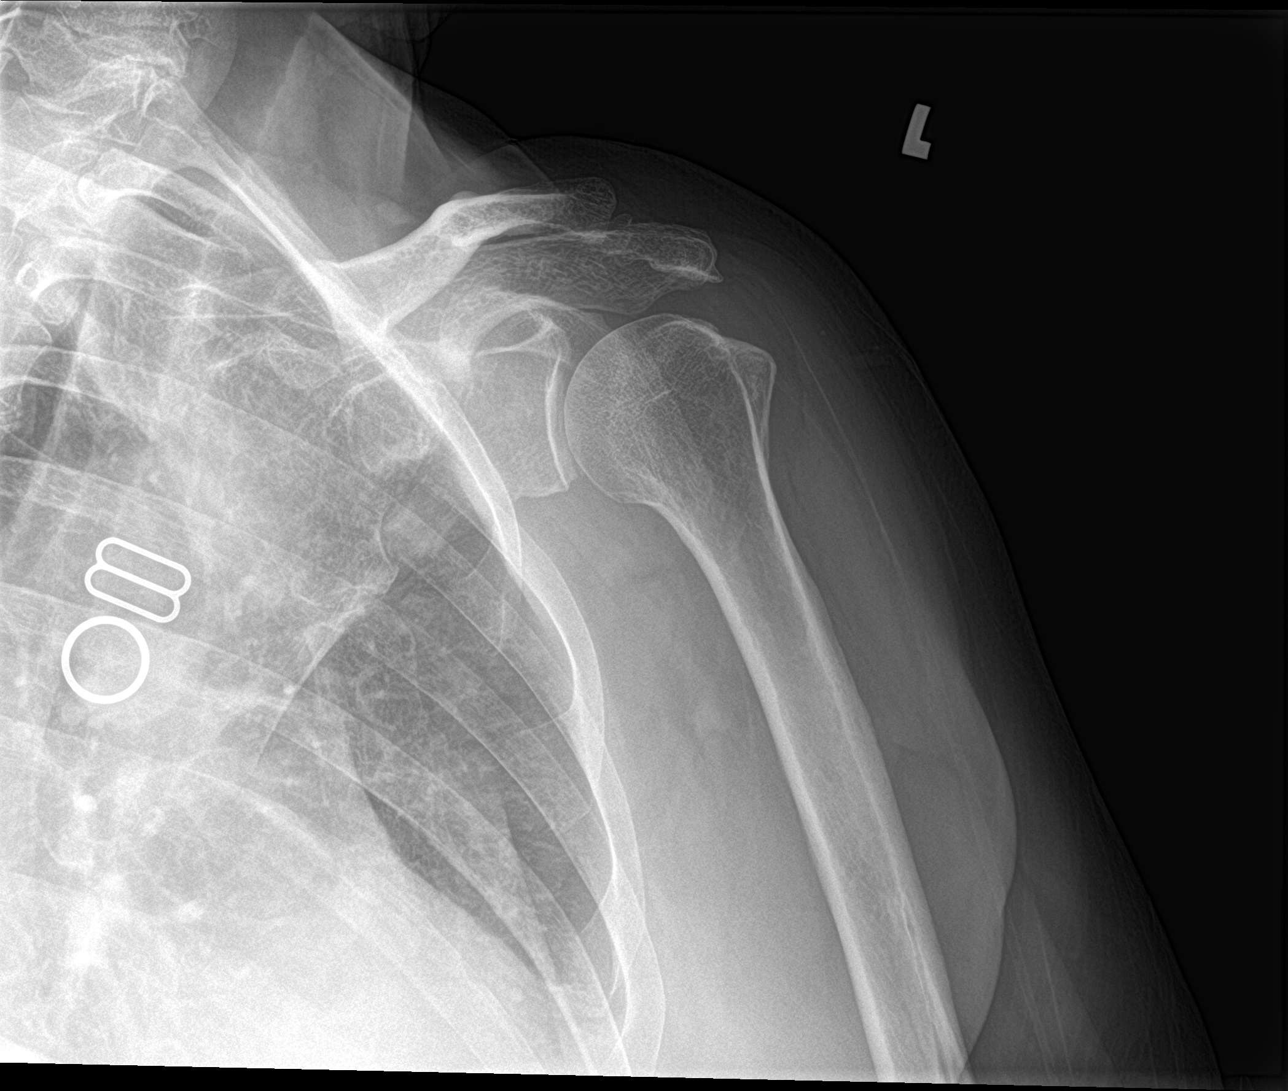

[3 of 3 positions shown; findings below may reference images not displayed]

FINDINGS: No fracture or dislocation. Mild glenohumeral degenerative changes
identified, best on the axillary view. No other significant
abnormalities are identified.
IMPRESSION: Mild glenohumeral degenerative changes only seen on the axillary
view. No other significant abnormalities.

## 2023-04-06 ENCOUNTER — Ambulatory Visit: Payer: 59

## 2023-04-29 ENCOUNTER — Other Ambulatory Visit: Payer: Self-pay | Admitting: Family Medicine

## 2023-04-29 DIAGNOSIS — E119 Type 2 diabetes mellitus without complications: Secondary | ICD-10-CM

## 2023-05-01 ENCOUNTER — Other Ambulatory Visit: Payer: Self-pay | Admitting: Family Medicine

## 2023-05-01 DIAGNOSIS — E119 Type 2 diabetes mellitus without complications: Secondary | ICD-10-CM

## 2023-05-02 ENCOUNTER — Other Ambulatory Visit: Payer: Self-pay | Admitting: Family Medicine

## 2023-05-02 ENCOUNTER — Ambulatory Visit: Payer: 59

## 2023-05-02 DIAGNOSIS — Z1231 Encounter for screening mammogram for malignant neoplasm of breast: Secondary | ICD-10-CM | POA: Diagnosis not present

## 2023-05-02 MED ORDER — FREESTYLE LIBRE 2 SENSOR MISC: 0 refills | Status: DC

## 2023-05-10 ENCOUNTER — Ambulatory Visit (INDEPENDENT_AMBULATORY_CARE_PROVIDER_SITE_OTHER): Payer: 59 | Admitting: Family Medicine

## 2023-05-10 DIAGNOSIS — H6122 Impacted cerumen, left ear: Secondary | ICD-10-CM

## 2023-05-10 DIAGNOSIS — H9192 Unspecified hearing loss, left ear: Secondary | ICD-10-CM

## 2023-05-10 NOTE — Progress Notes (Signed)
 Pt here to have cerumen removal done of L ear.

## 2023-05-10 NOTE — Progress Notes (Signed)
 Indication: Cerumen impaction of the left ear  Medical necessity statement: On physical examination, cerumen impairs clinically significant portions of the external auditory canal, and tympanic membrane. Noted obstructive, copious cerumen that cannot be removed without magnification and instrumentations  Consent: Discussed benefits and risks of procedure and verbal consent obtained Procedure: Patient was prepped for the procedure. Utilized an otoscope to assess and take note of the ear canal, the tympanic membrane, and the presence, amount, and placement of the cerumen. Gentle water irrigation and soft plastic curette was utilized to remove cerumen.  Post procedure examination: shows cerumen was completely removed. Patient tolerated procedure well. The patient is made aware that they may experience temporary vertigo, temporary hearing loss, and temporary discomfort. If these symptom last for more than 24 hours to call the clinic or proceed to the ED.   She is scheduled to get fitted for hearing aids today and they did not have the equipment to irrigate her left ear so is here today to have that done.  More recently she had had a cold and felt like her ears got clogged but says it just never really went away even after the respiratory infection cleared.

## 2023-05-24 NOTE — Progress Notes (Signed)
Referring-Catherine Metheney MD Reason for referral-chest pain  HPI: 68 year old female for evaluation of chest pain at request of Nani Gasser MD.  Cholesterol panel October 2024 showed total cholesterol 150, HDL 60 and LDL 68.  Previous evaluation at [facility]: Nuclear study April 2023 showed ejection fraction 78% with small reversible perfusion abnormality in the lateral wall suggestive of ischemia.  Coronary CTA May 2023 showed calcium score 72.9 which was 66 percentile and mild nonobstructive coronary disease in the mid LAD, circumflex and distal right coronary artery.  Patient states she has had intermittent chest pain since 6 months prior to the above evaluation.  She typically notes that this occurs at night.  It is in the left breast area without radiation.  Some associated diaphoresis but no nausea or dyspnea.  Can last all night at times.  Not pleuritic or positional.  She does not have exertional chest pain.  No dyspnea on exertion, orthopnea, PND or pedal edema.  Cardiology now asked to evaluate.  Current Outpatient Medications  Medication Sig Dispense Refill   Abatacept 125 MG/ML SOAJ Inject 1 Dose into the skin once a week. Sundays     albuterol (VENTOLIN HFA) 108 (90 Base) MCG/ACT inhaler Inhale 2 puffs into the lungs every 6 (six) hours as needed for wheezing or shortness of breath. 8.5 g 2   atorvastatin (LIPITOR) 20 MG tablet Take 1 tablet (20 mg total) by mouth at bedtime. 90 tablet 3   Calcium-Vitamin D-Vitamin K (VIACTIV CALCIUM PLUS D) 650-12.5-40 MG-MCG-MCG CHEW Chew 2 tablets by mouth in the morning and at bedtime.     citalopram (CELEXA) 20 MG tablet Take 1 tablet (20 mg total) by mouth daily. 90 tablet 1   Continuous Glucose Sensor (FREESTYLE LIBRE 2 SENSOR) MISC 6 (six) sensors/90 day supply 4 refills per year. DX:E11.9 Fax: [phone] 6 each 0   Dulaglutide (TRULICITY) 3 MG/0.5ML SOPN Inject 3 mg as directed once a week. 6 mL 3   EPINEPHrine 0.3 mg/0.3 mL IJ  SOAJ injection Inject 0.3 mg into the muscle as needed for anaphylaxis. 1 each 1   estradiol (ESTRACE VAGINAL) 0.1 MG/GM vaginal cream Place 1 Applicatorful vaginally at bedtime. For 2 weeks then 3 times a week. 42.5 g 3   insulin lispro (HUMALOG) 100 UNIT/ML injection Inject 0.05-0.1 mLs (5-10 Units total) into the skin 3 (three) times daily before meals. 10 mL 5   INSULIN SYRINGE 1CC/29G 29G X 1/2" 1 ML MISC Use up to TID for insulin. Ok to sub if needed as covered by insurance. 200 each 3   omeprazole (PRILOSEC) 20 MG capsule Take 1 capsule (20 mg total) by mouth daily. 90 capsule 3   ondansetron (ZOFRAN) 4 MG tablet Take 1 tablet (4 mg total) by mouth every 8 (eight) hours as needed for nausea or vomiting. 20 tablet 1   Telmisartan-amLODIPine 80-5 MG TABS Take 1 tablet by mouth daily. 90 tablet 3   triamcinolone cream (KENALOG) 0.1 % Apply 1 Application topically 2 (two) times daily. 80 g 0   doxepin (SINEQUAN) 10 MG capsule Take 1 capsule (10 mg total) by mouth at bedtime. (Patient not taking: Reported on 06/06/2023) 30 capsule 2   No current facility-administered medications for this visit.    Allergies  Allergen Reactions   Bee Venom Anaphylaxis and Hives   Lisinopril Shortness Of Breath   Azathioprine Nausea And Vomiting   Celecoxib Other (See Comments)    Patient reported   Metformin And Related Other (  See Comments)    Diarrhea and vomiting   Hydrochlorothiazide Other (See Comments)    Headaches   Red Dye #40 (Allura Red) Other (See Comments)   Enbrel [Etanercept] Other (See Comments)    ENT felt was causing hearing loss   [place] [Dapagliflozin] Other (See Comments)    Recurrent yeast infections   Hydroxychloroquine Rash   Methotrexate Derivatives Rash   Tape Rash    r     Past Medical History:  Diagnosis Date   Chronic rheumatic arthritis (HCC)    Diabetes mellitus without complication (HCC)    Hyperlipidemia    Hypertension     Past Surgical History:  Procedure  Laterality Date   BACK SURGERY     BILATERAL OOPHORECTOMY     BREAST BIOPSY     in pts 30s   BREAST CYST EXCISION     Pt unsure which breast & unable to find a scar   CESAREAN SECTION     x 3   CHOLECYSTECTOMY     TOTAL ABDOMINAL HYSTERECTOMY     TUBAL LIGATION      Social History   Socioeconomic History   Marital status: Divorced    Spouse name: Not on file   Number of children: 3   Years of education: 2 yr college   Highest education level: Associate degree: occupational, Scientist, product/process development, or vocational program  Occupational History   Occupation: retired  Tobacco Use   Smoking status: Former    Current packs/day: 0.00    Types: Cigarettes    Quit date: 08/01/2017    Years since quitting: 5.8   Smokeless tobacco: Never  Substance and Sexual Activity   Alcohol use: No   Drug use: No   Sexual activity: Never  Other Topics Concern   Not on file  Social History Narrative   Lives alone. She has three children. She enjoys playing tennis, sewing, painting stones and water aerobics.    Social Drivers of Corporate investment banker Strain: Low Risk  (05/31/2023)   Received from [facility]   Overall Financial Resource Strain (CARDIA)    Difficulty of Paying Living Expenses: Not very hard  Food Insecurity: No Food Insecurity (05/31/2023)   Received from [facility]   Hunger Vital Sign    Worried About Running Out of Food in the Last Year: Never true    Ran Out of Food in the Last Year: Never true  Transportation Needs: No Transportation Needs (05/31/2023)   Received from [facility] - Transportation    Lack of Transportation (Medical): No    Lack of Transportation (Non-Medical): No  Physical Activity: Sufficiently Active (05/31/2023)   Received from [facility]   Exercise Vital Sign    Days of Exercise per Week: 7 days    Minutes of Exercise per Session: 60 min  Stress: No Stress Concern Present (05/31/2023)   Received from [facility]  of Occupational Health - Occupational Stress Questionnaire    Feeling of Stress : Only a little  Recent Concern: Stress - Stress Concern Present (03/23/2023)   [employer] of Occupational Health - Occupational Stress Questionnaire    Feeling of Stress : To some extent  Social Connections: Moderately Integrated (05/31/2023)   Received from [facility]   Social Network    How would you rate your social network (family, work, friends)?: Adequate participation with social networks  Recent Concern: Social Connections - Moderately Isolated (05/10/2023)   Social Connection  and Isolation Panel [NHANES]    Frequency of Communication with Friends and Family: Three times a week    Frequency of Social Gatherings with Friends and Family: Three times a week    Attends Religious Services: Never    Active Member of Clubs or Organizations: Yes    Attends Banker Meetings: More than 4 times per year    Marital Status: Divorced  Intimate Partner Violence: Patient Declined (05/31/2023)   Received from [facility]   HITS    Over the last 12 months how often did your partner physically hurt you?: Patient declined    Over the last 12 months how often did your partner insult you or talk down to you?: Patient declined    Over the last 12 months how often did your partner threaten you with physical harm?: Patient declined    Over the last 12 months how often did your partner scream or curse at you?: Patient declined    Family History  Problem Relation Age of Onset   Colon cancer Mother    Heart attack Father 53    ROS: no fevers or chills, productive cough, hemoptysis, dysphasia, odynophagia, melena, hematochezia, dysuria, hematuria, rash, seizure activity, orthopnea, PND, pedal edema, claudication. Remaining systems are negative.  Physical Exam:   Blood pressure 110/60, pulse 77, height 5\' 4"  (1.626 m), weight 164 lb 1.9 oz (74.4 kg), SpO2 97%.  General:  Well developed/well nourished  in NAD Skin warm/dry Patient not depressed No peripheral clubbing Back-normal HEENT-normal/normal eyelids Neck supple/normal carotid upstroke bilaterally; no bruits; no JVD; no thyromegaly chest - CTA/ normal expansion CV - RRR/normal S1 and S2; no murmurs, rubs or gallops;  PMI nondisplaced Abdomen -NT/ND, no HSM, no mass, + bowel sounds, no bruit 2+ femoral pulses, no bruits Ext-no edema, chords, 2+ DP Neuro-grossly nonfocal  EKG Interpretation Date/Time:  Monday June 06 2023 08:38:03 EST Ventricular Rate:  77 PR Interval:  166 QRS Duration:  76 QT Interval:  380 QTC Calculation: 430 R Axis:   62  Text Interpretation: Normal sinus rhythm Nonspecific ST abnormality When compared with ECG of 26-Jul-2021 08:34, No significant change was found Confirmed by Olga Millers ([id number]) on 06/06/2023 8:42:36 AM    A/P  1 chest pain-symptoms are atypical.  Typically occur at night and no exertional symptoms.  They have been present for approximately 2-1/2 years.  They are unchanged.  Previous CTA by report showed mild nonobstructive coronary disease.  We discussed further evaluation today.  Will consider repeat CTA in the future if symptoms worsen.  2 history of mild coronary artery disease-add aspirin 81 mg daily and continue statin.  3 hypertension-blood pressure controlled.  Continue present medical regimen.  4 hyperlipidemia-most recent LDL not at goal.  She had myalgias with higher doses of Lipitor previously.  Will add Zetia 10 mg daily.  Check lipids and liver in 8 weeks.  Goal LDL less than 55.  Olga Millers, MD

## 2023-06-03 DIAGNOSIS — K5900 Constipation, unspecified: Secondary | ICD-10-CM | POA: Diagnosis not present

## 2023-06-03 DIAGNOSIS — Z800 Family history of malignant neoplasm of digestive organs: Secondary | ICD-10-CM | POA: Diagnosis not present

## 2023-06-03 DIAGNOSIS — K602 Anal fissure, unspecified: Secondary | ICD-10-CM | POA: Diagnosis not present

## 2023-06-06 ENCOUNTER — Encounter: Payer: Self-pay | Admitting: Cardiology

## 2023-06-06 ENCOUNTER — Ambulatory Visit (INDEPENDENT_AMBULATORY_CARE_PROVIDER_SITE_OTHER): Payer: 59 | Admitting: Cardiology

## 2023-06-06 VITALS — BP 110/60 | HR 77 | Ht 64.0 in | Wt 164.1 lb

## 2023-06-06 DIAGNOSIS — R072 Precordial pain: Secondary | ICD-10-CM | POA: Diagnosis not present

## 2023-06-06 DIAGNOSIS — I10 Essential (primary) hypertension: Secondary | ICD-10-CM | POA: Diagnosis not present

## 2023-06-06 DIAGNOSIS — E781 Pure hyperglyceridemia: Secondary | ICD-10-CM

## 2023-06-06 MED ORDER — EZETIMIBE 10 MG PO TABS: 10.0000 mg | ORAL_TABLET | Freq: Every day | ORAL | 3 refills | Status: DC

## 2023-06-06 MED ORDER — ASPIRIN 81 MG PO TBEC: 81.0000 mg | DELAYED_RELEASE_TABLET | Freq: Every day | ORAL | Status: AC

## 2023-06-06 NOTE — Patient Instructions (Signed)
Medication Instructions:   START ASPIRIN 81 MG ONCE DAILY  START EZETIMIBE 10 MG ONCE DAILY  *If you need a refill on your cardiac medications before your next appointment, please call your pharmacy*   Lab Work:  Your physician recommends that you return for lab work in:[facility]  If you have labs (blood work) drawn today and your tests are completely normal, you will receive your results only by: MyChart Message (if you have MyChart) OR A paper copy in the mail If you have any lab test that is abnormal or we need to change your treatment, we will call you to review the results.   Follow-Up: At [facility], you and your health needs are our priority.  As part of our continuing mission to provide you with exceptional heart care, we have created designated Provider Care Teams.  These Care Teams include your primary Cardiologist (physician) and Advanced Practice Providers (APPs -  Physician Assistants and Nurse Practitioners) who all work together to provide you with the care you need, when you need it.  We recommend signing up for the patient portal called "MyChart".  Sign up information is provided on this After Visit Summary.  MyChart is used to connect with patients for Virtual Visits (Telemedicine).  Patients are able to view lab/test results, encounter notes, upcoming appointments, etc.  Non-urgent messages can be sent to your provider as well.   To learn more about what you can do with MyChart, go to [email or web address].    Your next appointment:   6 month(s)  Provider:   Olga Millers MD

## 2023-06-27 ENCOUNTER — Encounter: Payer: Self-pay | Admitting: Family Medicine

## 2023-06-27 ENCOUNTER — Ambulatory Visit (INDEPENDENT_AMBULATORY_CARE_PROVIDER_SITE_OTHER): Payer: 59 | Admitting: Family Medicine

## 2023-06-27 VITALS — BP 127/87 | HR 88 | Ht 64.0 in | Wt 155.0 lb

## 2023-06-27 DIAGNOSIS — E785 Hyperlipidemia, unspecified: Secondary | ICD-10-CM | POA: Diagnosis not present

## 2023-06-27 DIAGNOSIS — Z7985 Long-term (current) use of injectable non-insulin antidiabetic drugs: Secondary | ICD-10-CM | POA: Diagnosis not present

## 2023-06-27 DIAGNOSIS — F325 Major depressive disorder, single episode, in full remission: Secondary | ICD-10-CM

## 2023-06-27 DIAGNOSIS — F418 Other specified anxiety disorders: Secondary | ICD-10-CM

## 2023-06-27 DIAGNOSIS — E119 Type 2 diabetes mellitus without complications: Secondary | ICD-10-CM | POA: Diagnosis not present

## 2023-06-27 DIAGNOSIS — I10 Essential (primary) hypertension: Secondary | ICD-10-CM

## 2023-06-27 LAB — POCT GLYCOSYLATED HEMOGLOBIN (HGB A1C): Hemoglobin A1C: 7.3 % — AB (ref 4.0–5.6)

## 2023-06-27 MED ORDER — TRULICITY 4.5 MG/0.5ML ~~LOC~~ SOAJ: 4.5000 mg | SUBCUTANEOUS | 1 refills | Status: DC

## 2023-06-27 MED ORDER — CITALOPRAM HYDROBROMIDE 20 MG PO TABS: 20.0000 mg | ORAL_TABLET | Freq: Every day | ORAL | 1 refills | Status: DC

## 2023-06-27 NOTE — Progress Notes (Signed)
 Marland Kitchen

## 2023-06-27 NOTE — Assessment & Plan Note (Signed)
 A1c went up to 7.3 today.  Now that she is doing better about consistently getting in a protein snack in the evenings lets try going back up on the Trulicity to 4.5 mg.  Call if any problems or concerns.

## 2023-06-27 NOTE — Assessment & Plan Note (Signed)
 That she actually has not had any chest pain with the combination of adding Zetia to her statin.  She has been able to bump up her statin to daily and do really well.  She is due for repeat labs in about 2 more months.

## 2023-06-27 NOTE — Assessment & Plan Note (Signed)
 Doing well overall with current regimen.  Continue citalopram 20 mg daily.

## 2023-06-27 NOTE — Progress Notes (Signed)
 Established Patient Office Visit  Subjective  Patient ID: Ann Torres, female    DOB: 1955-06-23  Age: 68 y.o. MRN: [id number]  No chief complaint on file.   HPI  Follow-up diabetes-she says her sugars have been all over the place a little bit.  We have gone back down to 3 mg of Trulicity back in July.  She been having an occasional low at night and so we decided to back off on it because she also had significantly decreased appetite.  But more recently she is been noticing her sugars are higher in the mornings even if she has not eaten anything.  She has been trying to eat some roasted chickpeas in the evening to help get a protein at night to keep her from having any hypoglycemia overnight.  Reports that her eye exam is up-to-date goes to [facility] eye surgeons.  Dr. Jens Som who added Zetia and she has been doing really well with that in combination with her statin she is due for repeat labs in April.    ROS    Objective:     BP 127/87   Pulse 88   Ht 5\' 4"  (1.626 m)   Wt 155 lb (70.3 kg)   SpO2 98%   BMI 26.61 kg/m    Physical Exam Vitals and nursing note reviewed.  Constitutional:      Appearance: Normal appearance.  HENT:     Head: Normocephalic and atraumatic.  Eyes:     Conjunctiva/sclera: Conjunctivae normal.  Cardiovascular:     Rate and Rhythm: Normal rate and regular rhythm.  Pulmonary:     Effort: Pulmonary effort is normal.     Breath sounds: Normal breath sounds.  Skin:    General: Skin is warm and dry.  Neurological:     Mental Status: She is alert.  Psychiatric:        Mood and Affect: Mood normal.      Results for orders placed or performed in visit on 06/27/23  POCT HgB A1C  Result Value Ref Range   Hemoglobin A1C 7.3 (A) 4.0 - 5.6 %   HbA1c POC (<> result, manual entry)     HbA1c, POC (prediabetic range)     HbA1c, POC (controlled diabetic range)        The ASCVD Risk score (Arnett DK, et al., 2019) failed to calculate for  the following reasons:   Unable to determine if patient is Non-Hispanic African American    Assessment & Plan:   Problem List Items Addressed This Visit       Cardiovascular and Mediastinum   Essential hypertension   Well controlled. Continue current regimen. Follow up in  6 months.         Endocrine   Diabetes mellitus without complication (HCC) - Primary   A1c went up to 7.3 today.  Now that she is doing better about consistently getting in a protein snack in the evenings lets try going back up on the Trulicity to 4.5 mg.  Call if any problems or concerns.      Relevant Medications   Dulaglutide (TRULICITY) 4.5 MG/0.5ML SOAJ   Other Relevant Orders   POCT HgB A1C (Completed)   Urine Microalbumin w/creat. ratio   Basic Metabolic Panel (BMET)     Other   Hyperlipidemia   That she actually has not had any chest pain with the combination of adding Zetia to her statin.  She has been able to bump up her statin to  daily and do really well.  She is due for repeat labs in about 2 more months.      Depression, major, single episode, complete remission (HCC)   Doing well overall with current regimen.  Continue citalopram 20 mg daily.      Relevant Medications   citalopram (CELEXA) 20 MG tablet   Other Visit Diagnoses       Depression with anxiety       Relevant Medications   citalopram (CELEXA) 20 MG tablet       Return in about 3 months (around 09/24/2023) for Diabetes follow-up.    Nani Gasser, MD

## 2023-06-27 NOTE — Assessment & Plan Note (Signed)
 Well controlled. Continue current regimen. Follow up in  6 months.

## 2023-06-28 ENCOUNTER — Encounter: Payer: Self-pay | Admitting: Family Medicine

## 2023-06-28 LAB — BASIC METABOLIC PANEL
BUN/Creatinine Ratio: 19 (ref 12–28)
BUN: 14 mg/dL (ref 8–27)
CO2: 18 mmol/L — ABNORMAL LOW (ref 20–29)
Calcium: 9.8 mg/dL (ref 8.7–10.3)
Chloride: 103 mmol/L (ref 96–106)
Creatinine, Ser: 0.74 mg/dL (ref 0.57–1.00)
Glucose: 194 mg/dL — ABNORMAL HIGH (ref 70–99)
Potassium: 4.3 mmol/L (ref 3.5–5.2)
Sodium: 139 mmol/L (ref 134–144)
eGFR: 88 mL/min/{1.73_m2} (ref >59)

## 2023-06-28 LAB — SPECIMEN STATUS REPORT

## 2023-06-28 LAB — MICROALBUMIN / CREATININE URINE RATIO
Creatinine, Urine: 136.4 mg/dL
Microalb/Creat Ratio: 7 mg/g{creat} (ref 0–29)
Microalbumin, Urine: 9.4 ug/mL

## 2023-06-28 NOTE — Progress Notes (Signed)
 Your lab work is within acceptable range and there are no concerning findings.   ?

## 2023-06-29 NOTE — Progress Notes (Signed)
 Hi [name], no excess protein in the urine which is great.

## 2023-07-05 DIAGNOSIS — M19042 Primary osteoarthritis, left hand: Secondary | ICD-10-CM | POA: Diagnosis not present

## 2023-07-05 DIAGNOSIS — M0579 Rheumatoid arthritis with rheumatoid factor of multiple sites without organ or systems involvement: Secondary | ICD-10-CM | POA: Diagnosis not present

## 2023-07-05 DIAGNOSIS — Z79899 Other long term (current) drug therapy: Secondary | ICD-10-CM | POA: Diagnosis not present

## 2023-07-05 DIAGNOSIS — M25512 Pain in left shoulder: Secondary | ICD-10-CM | POA: Diagnosis not present

## 2023-07-05 DIAGNOSIS — G8929 Other chronic pain: Secondary | ICD-10-CM | POA: Diagnosis not present

## 2023-07-05 DIAGNOSIS — M19041 Primary osteoarthritis, right hand: Secondary | ICD-10-CM | POA: Diagnosis not present

## 2023-07-13 ENCOUNTER — Other Ambulatory Visit: Payer: Self-pay | Admitting: Family Medicine

## 2023-07-13 DIAGNOSIS — E119 Type 2 diabetes mellitus without complications: Secondary | ICD-10-CM

## 2023-07-20 ENCOUNTER — Encounter: Payer: Self-pay | Admitting: Family Medicine

## 2023-07-20 ENCOUNTER — Other Ambulatory Visit: Payer: Self-pay | Admitting: *Deleted

## 2023-07-20 DIAGNOSIS — E119 Type 2 diabetes mellitus without complications: Secondary | ICD-10-CM

## 2023-07-20 MED ORDER — FREESTYLE LIBRE 2 SENSOR MISC: 3 refills | Status: DC

## 2023-07-20 MED ORDER — FREESTYLE LIBRE 3 SENSOR MISC: 1.0000 | 4 refills | Status: DC

## 2023-07-29 DIAGNOSIS — G8929 Other chronic pain: Secondary | ICD-10-CM | POA: Diagnosis not present

## 2023-07-29 DIAGNOSIS — M0579 Rheumatoid arthritis with rheumatoid factor of multiple sites without organ or systems involvement: Secondary | ICD-10-CM | POA: Diagnosis not present

## 2023-07-29 DIAGNOSIS — M25512 Pain in left shoulder: Secondary | ICD-10-CM | POA: Diagnosis not present

## 2023-07-29 DIAGNOSIS — M75112 Incomplete rotator cuff tear or rupture of left shoulder, not specified as traumatic: Secondary | ICD-10-CM | POA: Diagnosis not present

## 2023-08-02 ENCOUNTER — Encounter (INDEPENDENT_AMBULATORY_CARE_PROVIDER_SITE_OTHER): Admitting: Sports Medicine

## 2023-08-02 DIAGNOSIS — M19012 Primary osteoarthritis, left shoulder: Secondary | ICD-10-CM

## 2023-08-03 NOTE — Telephone Encounter (Signed)

## 2023-08-12 ENCOUNTER — Ambulatory Visit (INDEPENDENT_AMBULATORY_CARE_PROVIDER_SITE_OTHER): Admitting: Sports Medicine

## 2023-08-12 ENCOUNTER — Encounter: Payer: Self-pay | Admitting: Sports Medicine

## 2023-08-12 DIAGNOSIS — M75102 Unspecified rotator cuff tear or rupture of left shoulder, not specified as traumatic: Secondary | ICD-10-CM

## 2023-08-12 MED ORDER — MELOXICAM 15 MG PO TABS: ORAL_TABLET | ORAL | 3 refills | Status: AC

## 2023-08-12 MED ORDER — TRAMADOL HCL 50 MG PO TABS: 50.0000 mg | ORAL_TABLET | Freq: Three times a day (TID) | ORAL | 0 refills | Status: AC | PRN

## 2023-08-12 NOTE — Assessment & Plan Note (Signed)
 Very pleasant 68 year old female with history of rheumatoid arthritis, she does have chronic left shoulder pain. More recently her rheumatologist ordered a shoulder MRI that did end up showing a 2 cm supraspinatus tear. She was referred to me. On exam she has relatively good strength to abduction, external rotation. She does have some impingement signs and moderate pain. We discussed the anatomy and the force coupling function of the rotator cuff, we discussed the ability of her other muscles and cuff tendons to take over the function of the rotator cuff. 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 Considering her need to play high-level tennis I do think we need to consult with Dr. Everardo Pacific. I did however explain it is often possible to get the majority of function back with physical therapy, we will add some physical therapy at [facility], meloxicam, tramadol for breakthrough pain. Return to see me in 6 weeks.

## 2023-08-12 NOTE — Progress Notes (Signed)
    Procedures performed today:    None.  Independent interpretation of notes and tests performed by another provider:   None.  Brief History, Exam, Impression, and Recommendations:    Tear of left supraspinatus tendon Very pleasant 68 year old female with history of rheumatoid arthritis, she does have chronic left shoulder pain. More recently her rheumatologist ordered a shoulder MRI that did end up showing a 2 cm supraspinatus tear. She was referred to me. On exam she has relatively good strength to abduction, external rotation. She does have some impingement signs and moderate pain. We discussed the anatomy and the force coupling function of the rotator cuff, we discussed the ability of her other muscles and cuff tendons to take over the function of the rotator cuff. 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 Considering her need to play high-level tennis I do think we need to consult with Dr. Everardo Pacific. I did however explain it is often possible to get the majority of function back with physical therapy, we will add some physical therapy at [facility], meloxicam, tramadol for breakthrough pain. Return to see me in 6 weeks.    ____________________________________________ Ihor Austin. Benjamin Stain, M.D., ABFM., CAQSM., AME. Primary Care and Sports Medicine [facility]  Adjunct Professor of Family Medicine  [employer]  Restaurant manager, fast food

## 2023-08-25 ENCOUNTER — Other Ambulatory Visit: Payer: Self-pay | Admitting: Family Medicine

## 2023-08-25 DIAGNOSIS — E119 Type 2 diabetes mellitus without complications: Secondary | ICD-10-CM

## 2023-09-01 DIAGNOSIS — S46012A Strain of muscle(s) and tendon(s) of the rotator cuff of left shoulder, initial encounter: Secondary | ICD-10-CM | POA: Diagnosis not present

## 2023-09-02 ENCOUNTER — Other Ambulatory Visit: Payer: Self-pay | Admitting: Orthopaedic Surgery

## 2023-09-02 DIAGNOSIS — G8929 Other chronic pain: Secondary | ICD-10-CM

## 2023-09-07 ENCOUNTER — Telehealth: Payer: Self-pay | Admitting: Family Medicine

## 2023-09-07 NOTE — Telephone Encounter (Signed)
Patient is scheduled for June 17th.

## 2023-09-07 NOTE — Telephone Encounter (Signed)
 Please call pt to schedule pre-op clearance for her Left reverse total shoulder athroplasty

## 2023-09-15 ENCOUNTER — Ambulatory Visit
Admission: RE | Admit: 2023-09-15 | Discharge: 2023-09-15 | Disposition: A | Discharge status: Home / Self Care | Source: Ambulatory Visit | Attending: Orthopaedic Surgery

## 2023-09-15 DIAGNOSIS — M25512 Pain in left shoulder: Secondary | ICD-10-CM | POA: Diagnosis not present

## 2023-09-15 DIAGNOSIS — G8929 Other chronic pain: Secondary | ICD-10-CM

## 2023-09-15 DIAGNOSIS — M19012 Primary osteoarthritis, left shoulder: Secondary | ICD-10-CM | POA: Diagnosis not present

## 2023-09-22 ENCOUNTER — Encounter: Payer: Self-pay | Admitting: *Deleted

## 2023-09-23 ENCOUNTER — Ambulatory Visit: Admitting: Sports Medicine

## 2023-09-27 ENCOUNTER — Ambulatory Visit: Payer: 59 | Admitting: Family Medicine

## 2023-10-03 DIAGNOSIS — E781 Pure hyperglyceridemia: Secondary | ICD-10-CM | POA: Diagnosis not present

## 2023-10-03 DIAGNOSIS — E785 Hyperlipidemia, unspecified: Secondary | ICD-10-CM | POA: Diagnosis not present

## 2023-10-03 DIAGNOSIS — I10 Essential (primary) hypertension: Secondary | ICD-10-CM | POA: Diagnosis not present

## 2023-10-03 DIAGNOSIS — R748 Abnormal levels of other serum enzymes: Secondary | ICD-10-CM | POA: Diagnosis not present

## 2023-10-04 ENCOUNTER — Ambulatory Visit: Payer: Self-pay | Admitting: Cardiology

## 2023-10-04 LAB — LIPID PANEL
Chol/HDL Ratio: 2 ratio (ref 0.0–4.4)
Cholesterol, Total: 111 mg/dL (ref 100–199)
HDL: 55 mg/dL (ref >39)
LDL Chol Calc (NIH): 38 mg/dL (ref 0–99)
Triglycerides: 97 mg/dL (ref 0–149)
VLDL Cholesterol Cal: 18 mg/dL (ref 5–40)

## 2023-10-04 LAB — HEPATIC FUNCTION PANEL
ALT: 25 IU/L (ref 0–32)
AST: 24 IU/L (ref 0–40)
Albumin: 4.4 g/dL (ref 3.9–4.9)
Alkaline Phosphatase: 159 IU/L — ABNORMAL HIGH (ref 44–121)
Bilirubin Total: 0.6 mg/dL (ref 0.0–1.2)
Bilirubin, Direct: 0.23 mg/dL (ref 0.00–0.40)
Total Protein: 6.6 g/dL (ref 6.0–8.5)

## 2023-10-10 LAB — SPECIMEN STATUS REPORT

## 2023-10-10 LAB — GAMMA GT: GGT: 21 IU/L (ref 0–60)

## 2023-10-10 LAB — NUCLEOTIDASE, 5', BLOOD: 5-Nucleotidase: 4 IU/L (ref 0–11)

## 2023-10-17 ENCOUNTER — Ambulatory Visit: Admitting: Family Medicine

## 2023-10-18 ENCOUNTER — Ambulatory Visit: Admitting: Family Medicine

## 2023-10-18 ENCOUNTER — Encounter: Payer: Self-pay | Admitting: Family Medicine

## 2023-10-18 NOTE — Telephone Encounter (Signed)
 Please see mychart message sent by pt and advise.

## 2023-10-25 ENCOUNTER — Ambulatory Visit (INDEPENDENT_AMBULATORY_CARE_PROVIDER_SITE_OTHER): Admitting: Family Medicine

## 2023-10-25 ENCOUNTER — Encounter: Payer: Self-pay | Admitting: Family Medicine

## 2023-10-25 VITALS — BP 130/59 | HR 72 | Ht 64.0 in | Wt 166.0 lb

## 2023-10-25 DIAGNOSIS — I10 Essential (primary) hypertension: Secondary | ICD-10-CM | POA: Diagnosis not present

## 2023-10-25 DIAGNOSIS — R109 Unspecified abdominal pain: Secondary | ICD-10-CM

## 2023-10-25 DIAGNOSIS — Z01818 Encounter for other preprocedural examination: Secondary | ICD-10-CM

## 2023-10-25 DIAGNOSIS — E119 Type 2 diabetes mellitus without complications: Secondary | ICD-10-CM

## 2023-10-25 DIAGNOSIS — Z794 Long term (current) use of insulin: Secondary | ICD-10-CM

## 2023-10-25 LAB — POCT GLYCOSYLATED HEMOGLOBIN (HGB A1C): Hemoglobin A1C: 7.8 % — AB (ref 4.0–5.6)

## 2023-10-25 MED ORDER — TIRZEPATIDE 10 MG/0.5ML ~~LOC~~ SOAJ: 10.0000 mg | SUBCUTANEOUS | 0 refills | Status: DC

## 2023-10-25 NOTE — Assessment & Plan Note (Signed)
 A1c is 7.8 today which is up a little bit from previous of 7.3.  She is on long-acting and short acting insulin .  She is doing well on the Trulicity  4.5 mg.  Add like to see if we could get the [facility] covered it might be a little bit more powerful she does notice that her sugars always look better the first 3 days after her Trulicity  but then they start trending up she does not feel like she is really eating differently.  Not getting great control with the Trulicity  would like to switch to Mounjaro for better efficacy.  Also she has a CGM so just really encouraged her to use it as a learning total to really see how her body responds to different foods and quantity of those foods.  She really does a pretty good job overall with her diet.  She does eat a fair amount of fruit but usually gets berries.

## 2023-10-25 NOTE — Progress Notes (Unsigned)
 Established Patient Office Visit  Subjective  Patient ID: Ann Torres, female    DOB: 20-Jan-1956  Age: 68 y.o. MRN: [id number]  Chief Complaint  Patient presents with   Pre-op Exam    HPI  Here for preop clearnce for 4 left reverse total shoulder arthroplasty.  She will have an interscalene block instead of full anesthesia.  Dr. Raguel office plans on doing the lab work.  They have requested specialty input from rheumatology in regards to her Biologics.  They want to make sure that her sugars are adequately controlled she is on the long-acting and short acting insulin .  She is also been having some right sided intermittent abdominal pain.  She says usually in the mornings nausea will wake her up out of sleep she will have a little bit of right sided abdominal pain and then she will vomit mostly bile but then she feels better this been going on for almost a month.  She has had a cholecystectomy.  {History (Optional):23778}  ROS    Objective:     BP (!) 130/59   Pulse 72   Ht 5' 4 (1.626 m)   Wt 166 lb 0.6 oz (75.3 kg)   SpO2 100%   BMI 28.50 kg/m  {Vitals History (Optional):23777}  Physical Exam Constitutional:      Appearance: Normal appearance.  HENT:     Head: Normocephalic and atraumatic.     Right Ear: Tympanic membrane, ear canal and external ear normal.     Left Ear: Tympanic membrane, ear canal and external ear normal.     Nose: Nose normal.     Mouth/Throat:     Pharynx: Oropharynx is clear.   Eyes:     Extraocular Movements: Extraocular movements intact.     Conjunctiva/sclera: Conjunctivae normal.     Pupils: Pupils are equal, round, and reactive to light.   Neck:     Thyroid : No thyromegaly.   Cardiovascular:     Rate and Rhythm: Normal rate and regular rhythm.  Pulmonary:     Effort: Pulmonary effort is normal.     Breath sounds: Normal breath sounds.  Abdominal:     General: Bowel sounds are normal.     Palpations: Abdomen is soft.      Tenderness: There is abdominal tenderness.     Comments: Mild tenderness in the RUQ.     Musculoskeletal:        General: No swelling.     Cervical back: Neck supple.   Skin:    General: Skin is warm and dry.   Neurological:     Mental Status: She is oriented to person, place, and time.   Psychiatric:        Mood and Affect: Mood normal.        Behavior: Behavior normal.      Results for orders placed or performed in visit on 10/25/23  POCT HgB A1C  Result Value Ref Range   Hemoglobin A1C 7.8 (A) 4.0 - 5.6 %   HbA1c POC (<> result, manual entry)     HbA1c, POC (prediabetic range)     HbA1c, POC (controlled diabetic range)      {Labs (Optional):23779}  The ASCVD Risk score (Arnett DK, et al., 2019) failed to calculate for the following reasons:   The valid total cholesterol range is 130 to 320 mg/dL   Unable to determine if patient is Non-Hispanic African American    Assessment & Plan:   Problem List Items  Addressed This Visit       Cardiovascular and Mediastinum   Essential hypertension - Primary   Relevant Orders   POCT HgB A1C (Completed)   EKG 12-Lead   CMP14+EGFR   TSH   CBC with Differential/Platelet   Lipase     Endocrine   Diabetes mellitus without complication (HCC)   A1c is 7.8 today which is up a little bit from previous of 7.3.  She is on long-acting and short acting insulin .  She is doing well on the Trulicity  4.5 mg.  Add like to see if we could get the [facility] covered it might be a little bit more powerful she does notice that her sugars always look better the first 3 days after her Trulicity  but then they start trending up she does not feel like she is really eating differently.  Not getting great control with the Trulicity  would like to switch to Mounjaro for better efficacy.  Also she has a CGM so just really encouraged her to use it as a learning total to really see how her body responds to different foods and quantity of those foods.  She  really does a pretty good job overall with her diet.  She does eat a fair amount of fruit but usually gets berries.      Relevant Medications   tirzepatide (MOUNJARO) 10 MG/0.5ML Pen   Other Relevant Orders   POCT HgB A1C (Completed)   EKG 12-Lead   AMB Referral VBCI Care Management   CMP14+EGFR   TSH   CBC with Differential/Platelet   Lipase   Other Visit Diagnoses       Preop examination       Relevant Orders   POCT HgB A1C (Completed)   EKG 12-Lead     Right sided abdominal pain       Relevant Orders   CMP14+EGFR   TSH   CBC with Differential/Platelet   Lipase       KG today shows rate of 62 bpm, normal sinus rhythm with no acute ST-T wave changes.  No follow-ups on file.    Dorothyann Byars, MD

## 2023-10-26 ENCOUNTER — Telehealth: Payer: Self-pay | Admitting: *Deleted

## 2023-10-26 ENCOUNTER — Encounter: Payer: Self-pay | Admitting: Family Medicine

## 2023-10-26 DIAGNOSIS — I10 Essential (primary) hypertension: Secondary | ICD-10-CM | POA: Diagnosis not present

## 2023-10-26 DIAGNOSIS — E119 Type 2 diabetes mellitus without complications: Secondary | ICD-10-CM | POA: Diagnosis not present

## 2023-10-26 DIAGNOSIS — R109 Unspecified abdominal pain: Secondary | ICD-10-CM | POA: Diagnosis not present

## 2023-10-26 NOTE — Assessment & Plan Note (Addendum)
 Blood pressure at goal and well-controlled no other cardiac risk factors.

## 2023-10-26 NOTE — Progress Notes (Signed)
 Care Guide Pharmacy Note  10/26/2023 Name: Ann Torres MRN: [id number] DOB: 08/13/1955  Referred By: Alvan Dorothyann JONETTA, MD Reason for referral: Complex Care Management (Outreach to schedule referral with pharmacist ) and Call Attempt #1   Ann Torres is a 68 y.o. year old female who is a primary care patient of Alvan Dorothyann JONETTA, MD.  [name] was referred to the pharmacist for assistance related to: DMII  Successful contact was made with the patient to discuss pharmacy services including being ready for the pharmacist to call at least 5 minutes before the scheduled appointment time and to have medication bottles and any blood pressure readings ready for review. The patient agreed to meet with the pharmacist via telephone visit on 10/27/2023  Thedford Franks, CMA [place]  [facility], [facility] Guide Direct Dial : [phone]  Fax: [phone] Website: [email or web address]

## 2023-10-27 ENCOUNTER — Other Ambulatory Visit

## 2023-10-27 ENCOUNTER — Ambulatory Visit: Payer: Self-pay | Admitting: Family Medicine

## 2023-10-27 LAB — CMP14+EGFR
ALT: 20 IU/L (ref 0–32)
AST: 22 IU/L (ref 0–40)
Albumin: 4.4 g/dL (ref 3.9–4.9)
Alkaline Phosphatase: 150 IU/L — ABNORMAL HIGH (ref 44–121)
BUN/Creatinine Ratio: 21 (ref 12–28)
BUN: 15 mg/dL (ref 8–27)
Bilirubin Total: 0.8 mg/dL (ref 0.0–1.2)
CO2: 20 mmol/L (ref 20–29)
Calcium: 9.3 mg/dL (ref 8.7–10.3)
Chloride: 102 mmol/L (ref 96–106)
Creatinine, Ser: 0.71 mg/dL (ref 0.57–1.00)
Globulin, Total: 2.2 g/dL (ref 1.5–4.5)
Glucose: 200 mg/dL — ABNORMAL HIGH (ref 70–99)
Potassium: 4.5 mmol/L (ref 3.5–5.2)
Sodium: 138 mmol/L (ref 134–144)
Total Protein: 6.6 g/dL (ref 6.0–8.5)
eGFR: 92 mL/min/{1.73_m2} (ref >59)

## 2023-10-27 LAB — CBC WITH DIFFERENTIAL/PLATELET
Basophils Absolute: 0.1 10*3/uL (ref 0.0–0.2)
Basos: 1 %
EOS (ABSOLUTE): 0.2 10*3/uL (ref 0.0–0.4)
Eos: 2 %
Hematocrit: 42.5 % (ref 34.0–46.6)
Hemoglobin: 13.2 g/dL (ref 11.1–15.9)
Immature Grans (Abs): 0.1 10*3/uL (ref 0.0–0.1)
Immature Granulocytes: 1 %
Lymphocytes Absolute: 3.6 10*3/uL — ABNORMAL HIGH (ref 0.7–3.1)
Lymphs: 38 %
MCH: 25.7 pg — ABNORMAL LOW (ref 26.6–33.0)
MCHC: 31.1 g/dL — ABNORMAL LOW (ref 31.5–35.7)
MCV: 83 fL (ref 79–97)
Monocytes Absolute: 0.6 10*3/uL (ref 0.1–0.9)
Monocytes: 7 %
Neutrophils Absolute: 4.8 10*3/uL (ref 1.4–7.0)
Neutrophils: 51 %
Platelets: 268 10*3/uL (ref 150–450)
RBC: 5.14 x10E6/uL (ref 3.77–5.28)
RDW: 13.2 % (ref 11.7–15.4)
WBC: 9.4 10*3/uL (ref 3.4–10.8)

## 2023-10-27 LAB — TSH: TSH: 1.27 u[IU]/mL (ref 0.450–4.500)

## 2023-10-27 LAB — LIPASE: Lipase: 56 U/L (ref 14–72)

## 2023-10-27 NOTE — Progress Notes (Signed)
 Patient outreach for scheduled telephone visit initially unsuccessful, but patient returned my call.  She is having dental work done today that was scheduled last minute, so telephone visit has been rescheduled to tomorrow at 2pm.  Breely Panik A Hillari Zumwalt, PharmD, DPLA

## 2023-10-27 NOTE — Progress Notes (Signed)
 Hi Ann Torres, alkaline phosphatase is still mildly elevated similar to about 3 weeks ago but just a little bit better.  We will call the lab and add a GGT level to see if it could be more liver.  Your blood count overall looks good.  Thyroid  looks perfect.  No sign of pancreatitis.  I would like to consider getting an abdominal ultrasound especially since you have been having that intermittent pain and then throwing up in the morning.  But would you be okay with that?  Please call lab and add GGT.

## 2023-10-28 ENCOUNTER — Other Ambulatory Visit: Payer: Self-pay

## 2023-10-28 ENCOUNTER — Other Ambulatory Visit: Payer: Self-pay | Admitting: Family Medicine

## 2023-10-28 DIAGNOSIS — R748 Abnormal levels of other serum enzymes: Secondary | ICD-10-CM

## 2023-10-28 MED ORDER — TIRZEPATIDE 7.5 MG/0.5ML ~~LOC~~ SOAJ: 7.5000 mg | SUBCUTANEOUS | 0 refills | Status: DC

## 2023-10-28 NOTE — Progress Notes (Unsigned)
 10/28/2023 Name: Ann Torres MRN: [id number] DOB: 06-Jul-1955  Chief Complaint  Patient presents with   Diabetes Management Plan   Ann Torres is a 68 y.o. year old female who presented for a telephone visit.   They were referred to the pharmacist by their PCP for assistance in managing diabetes.   Subjective:  Care Team: Primary Care Provider: Alvan Dorothyann JONETTA, MD ; Next Scheduled Visit: 01/25/24  Medication Access/Adherence  Current Pharmacy:  [facility] - [place], [place] - [address] [place] [address] Phone: [phone] Fax: [phone]  [facility] [address] [place], [place] - [address] [place] [address] Phone: [phone] Fax: [phone]  [facility] [address], [place] - [address] [place] [address] Phone: [phone] Fax: [phone]  -Patient reports affordability concerns with their medications: No  -Patient reports access/transportation concerns to their pharmacy: No  -Patient reports adherence concerns with their medications:  No    Diabetes: Current medications: Trulicity  4.5mg  weekly, Humalog  5-10 units TID before meals -Medications tried in the past: Did not tolerate Farxiga (yeast infections) or metformin  (GI upset) in the past -Current glucose readings: >200 -Using Libre 3 for CGM -Patient endorses healthy lifestyle and frustration with elevated BG readings and A1c -A1c 7.8% on 6/24 -Dr. Alvan has prescribed Mounjaro  for patient to switch to, and insurance has approved coverage of this medication -Patient currently has several months of Trulicity  4.5mg  on hand -Tentative upcoming surgery 6/16 if cleared based on A1c to have procedure  Objective:  Lab Results  Component Value Date   HGBA1C 7.8 (A) 10/25/2023   Lab Results  Component Value Date   CREATININE 0.71  10/26/2023   BUN 15 10/26/2023   NA 138 10/26/2023   K 4.5 10/26/2023   CL 102 10/26/2023   CO2 20 10/26/2023   Medications Reviewed Today     Reviewed by Deanna Channing LABOR, RPH (Pharmacist) on 10/28/23 at 1419  Med List Status: <None>   Medication Order Taking? Sig Documenting Provider Last Dose Status Informant  Abatacept 125 MG/ML SOAJ [id number]  Inject 1 Dose into the skin once a week. Sundays [provider]  Active   albuterol  (VENTOLIN  HFA) 108 (90 Base) MCG/ACT inhaler [id number]  Inhale 2 puffs into the lungs every 6 (six) hours as needed for wheezing or shortness of breath. Alvan Dorothyann JONETTA, MD  Active   aspirin  EC 81 MG tablet [id number]  Take 1 tablet (81 mg total) by mouth daily. Swallow whole. Pietro Redell RAMAN, MD  Active   atorvastatin  (LIPITOR) 20 MG tablet [id number]  Take 1 tablet (20 mg total) by mouth at bedtime. Alvan Dorothyann JONETTA, MD  Active   Calcium -Vitamin D -Vitamin K (VIACTIV CALCIUM  PLUS D) 650-12.5-40 MG-MCG-MCG CHEW [id number]  Chew 2 tablets by mouth in the morning and at bedtime. [provider]  Active   citalopram  (CELEXA ) 20 MG tablet [id number]  Take 1 tablet (20 mg total) by mouth daily. Alvan Dorothyann JONETTA, MD  Active   Continuous Glucose Sensor (FREESTYLE LIBRE 2 SENSOR) MISC [id number]  6 (six) sensors/90 day supply 4 refills per year. DX:E11.9 Fax: [phone] Alvan Dorothyann JONETTA, MD  Active   Continuous Glucose Sensor (FREESTYLE LIBRE 3 SENSOR) [place] [id number] Yes 1 each by Does not apply route every 14 (fourteen) days. Place 1 sensor on the skin every 14 days. Use to check glucose continuously.6 (  six) sensors/90 day supply 4 refills per year. DX: E11.9 Alvan Dorothyann BIRCH, MD  Active   EPINEPHrine  0.3 mg/0.3 mL IJ SOAJ injection [id number]  Inject 0.3 mg into the muscle as needed for anaphylaxis. Alvan Dorothyann BIRCH, MD  Active   estradiol  (ESTRACE  VAGINAL) 0.1 MG/GM vaginal cream [id number]  Place 1 Applicatorful vaginally at  bedtime. For 2 weeks then 3 times a week. Alvan Dorothyann BIRCH, MD  Active   ezetimibe  (ZETIA ) 10 MG tablet [id number]  Take 1 tablet (10 mg total) by mouth daily. Pietro Redell RAMAN, MD  Active   insulin  lispro (HUMALOG ) 100 UNIT/ML injection [id number] Yes Inject 0.05-0.1 mLs (5-10 Units total) into the skin 3 (three) times daily before meals. Alvan Dorothyann BIRCH, MD  Active   meloxicam  (MOBIC ) 15 MG tablet [id number]  One tab PO every 24 hours with a meal for 2 weeks, then once every 24 hours prn pain. Curtis Debby PARAS, MD  Active   omeprazole  (PRILOSEC) 20 MG capsule [id number]  Take 1 capsule (20 mg total) by mouth daily. Alvan Dorothyann BIRCH, MD  Active   ondansetron  (ZOFRAN ) 4 MG tablet [id number]  Take 1 tablet (4 mg total) by mouth every 8 (eight) hours as needed for nausea or vomiting. Alvan Dorothyann BIRCH, MD  Active   Telmisartan -amLODIPine  80-5 MG TABS [id number]  Take 1 tablet by mouth daily. Alvan Dorothyann BIRCH, MD  Active   tirzepatide  (MOUNJARO ) 10 MG/0.5ML Pen [id number]  Inject 10 mg into the skin once a week.  Patient not taking: Reported on 10/28/2023   Alvan Dorothyann BIRCH, MD  Active   traMADol  (ULTRAM ) 50 MG tablet [id number]  Take 1 tablet (50 mg total) by mouth every 8 (eight) hours as needed for moderate pain (pain score 4-6). Curtis Debby PARAS, MD  Active   triamcinolone  cream (KENALOG ) 0.1 % [id number]  Apply 1 Application topically 2 (two) times daily. Antoniette Vermell CROME, PA-C  Active            Assessment/Plan:   Diabetes: -Currently uncontrolled -Patient will go ahead and switch to Mounjaro  now versus after upcoming surgery and/or completing Trulicity  4.5mg  on hand -Will start with Mounjaro  7.5mg  weekly x 4 weeks to verify patient is going to tolerate medication okay -Continue Humalog  with meals for now; hopefully as we are able to increase Mounjaro  we can decrease or even stop Humalog  -Continue use of Libre 3 for CGM  Follow Up Plan: 4 weeks  [name], PharmD, DPLA

## 2023-10-28 NOTE — Progress Notes (Signed)
 Orders Placed This Encounter  Procedures   US  ABDOMEN RUQ W/ELASTOGRAPHY    Standing Status:   Future    Expiration Date:   10/27/2024    Reason for Exam (SYMPTOM  OR DIAGNOSIS REQUIRED):   elevard alk phose    Preferred Imaging Location?:   [facility]

## 2023-10-29 LAB — GAMMA GT: GGT: 21 IU/L (ref 0–60)

## 2023-10-29 LAB — SPECIMEN STATUS REPORT

## 2023-10-31 NOTE — Progress Notes (Signed)
 HI Rilee, the GGT which is that exertional liver number was also normal again just making sure.

## 2023-11-01 ENCOUNTER — Ambulatory Visit

## 2023-11-01 DIAGNOSIS — R748 Abnormal levels of other serum enzymes: Secondary | ICD-10-CM

## 2023-11-06 ENCOUNTER — Ambulatory Visit: Payer: Self-pay | Admitting: Family Medicine

## 2023-11-06 DIAGNOSIS — K760 Fatty (change of) liver, not elsewhere classified: Secondary | ICD-10-CM | POA: Insufficient documentation

## 2023-11-06 DIAGNOSIS — E119 Type 2 diabetes mellitus without complications: Secondary | ICD-10-CM

## 2023-11-06 DIAGNOSIS — Z01818 Encounter for other preprocedural examination: Secondary | ICD-10-CM

## 2023-11-06 NOTE — Progress Notes (Signed)
 Hi Modene, gallbladder and bile duct look good.  Liver just shows fatty liver.  Treatment is just continue to work on healthy diet, regular exercise for 20 minutes 5 days a week, and keeping blood sugars under good control.  Will just continue to monitor.  Portal vein is open which is good.  They did not see any mass.

## 2023-11-07 ENCOUNTER — Ambulatory Visit: Admitting: Family Medicine

## 2023-11-07 DIAGNOSIS — M0579 Rheumatoid arthritis with rheumatoid factor of multiple sites without organ or systems involvement: Secondary | ICD-10-CM | POA: Diagnosis not present

## 2023-11-07 DIAGNOSIS — Z79899 Other long term (current) drug therapy: Secondary | ICD-10-CM | POA: Diagnosis not present

## 2023-11-14 DIAGNOSIS — K760 Fatty (change of) liver, not elsewhere classified: Secondary | ICD-10-CM | POA: Diagnosis not present

## 2023-11-14 DIAGNOSIS — Z01818 Encounter for other preprocedural examination: Secondary | ICD-10-CM | POA: Diagnosis not present

## 2023-11-14 DIAGNOSIS — E119 Type 2 diabetes mellitus without complications: Secondary | ICD-10-CM | POA: Diagnosis not present

## 2023-11-14 NOTE — Addendum Note (Signed)
 Addended by: FREYA BASCOM CROME on: 11/14/2023 08:31 AM   Modules accepted: Orders

## 2023-11-15 ENCOUNTER — Other Ambulatory Visit (INDEPENDENT_AMBULATORY_CARE_PROVIDER_SITE_OTHER): Admitting: *Deleted

## 2023-11-15 DIAGNOSIS — Z794 Long term (current) use of insulin: Secondary | ICD-10-CM | POA: Diagnosis not present

## 2023-11-15 DIAGNOSIS — E119 Type 2 diabetes mellitus without complications: Secondary | ICD-10-CM

## 2023-11-15 LAB — CBC WITH DIFFERENTIAL/PLATELET
Basophils Absolute: 0.1 x10E3/uL (ref 0.0–0.2)
Basos: 1 %
EOS (ABSOLUTE): 0.2 x10E3/uL (ref 0.0–0.4)
Eos: 2 %
Hematocrit: 41.3 % (ref 34.0–46.6)
Hemoglobin: 12.8 g/dL (ref 11.1–15.9)
Immature Grans (Abs): 0 x10E3/uL (ref 0.0–0.1)
Immature Granulocytes: 0 %
Lymphocytes Absolute: 3.5 x10E3/uL — ABNORMAL HIGH (ref 0.7–3.1)
Lymphs: 42 %
MCH: 25.8 pg — ABNORMAL LOW (ref 26.6–33.0)
MCHC: 31 g/dL — ABNORMAL LOW (ref 31.5–35.7)
MCV: 83 fL (ref 79–97)
Monocytes Absolute: 0.6 x10E3/uL (ref 0.1–0.9)
Monocytes: 7 %
Neutrophils Absolute: 3.9 x10E3/uL (ref 1.4–7.0)
Neutrophils: 48 %
Platelets: 246 x10E3/uL (ref 150–450)
RBC: 4.97 x10E6/uL (ref 3.77–5.28)
RDW: 13.7 % (ref 11.7–15.4)
WBC: 8.3 x10E3/uL (ref 3.4–10.8)

## 2023-11-15 LAB — POCT GLYCOSYLATED HEMOGLOBIN (HGB A1C): Hemoglobin A1C: 7.5 % — AB (ref 4.0–5.6)

## 2023-11-15 LAB — CMP14+EGFR
ALT: 19 IU/L (ref 0–32)
AST: 24 IU/L (ref 0–40)
Albumin: 4.3 g/dL (ref 3.9–4.9)
Alkaline Phosphatase: 125 IU/L — ABNORMAL HIGH (ref 44–121)
BUN/Creatinine Ratio: 19 (ref 12–28)
BUN: 14 mg/dL (ref 8–27)
Bilirubin Total: 0.6 mg/dL (ref 0.0–1.2)
CO2: 18 mmol/L — ABNORMAL LOW (ref 20–29)
Calcium: 9.1 mg/dL (ref 8.7–10.3)
Chloride: 105 mmol/L (ref 96–106)
Creatinine, Ser: 0.72 mg/dL (ref 0.57–1.00)
Globulin, Total: 2.3 g/dL (ref 1.5–4.5)
Glucose: 169 mg/dL — ABNORMAL HIGH (ref 70–99)
Potassium: 4.5 mmol/L (ref 3.5–5.2)
Sodium: 141 mmol/L (ref 134–144)
Total Protein: 6.6 g/dL (ref 6.0–8.5)
eGFR: 90 mL/min/1.73 (ref >59)

## 2023-11-15 NOTE — Progress Notes (Signed)
 Hi Esparanza, hemoglobin stable and looks good before surgery.  Metabolic panel is okay glucose was 169.  Alk phos is still elevated but almost back to normal which is fantastic so it already looks better compared to 2 weeks ago.  I think just getting the sugars down has made a big difference.  I am not sure why her A1c is not entered I know we ended up doing a point-of-care so I will make sure that that gets entered today.

## 2023-11-16 ENCOUNTER — Telehealth: Payer: Self-pay

## 2023-11-16 NOTE — Progress Notes (Signed)
   11/16/2023  Patient ID: Ann Torres, female   DOB: 06-24-1955, 68 y.o.   MRN: [id number]  Incoming call from patient with concern regarding stopping Mounjaro  7.5mg  prior to upcoming surgery scheduled for 7/30.  Surgery informed patient they will not operate if blood sugar is high the morning of her procedure, but they did no quantify high.  Patient has had 3 doses of Mounjaro  7.5mg  weekly after changing over from Trulicity  4.5mg  and FBG has been well controlled <130.  She does not use long acting insulin  and cannot take Mounjaro  the week of her surgery, so she is concerned BG will be too high for them to operate.  Patient will have last dose of Mounjaro  on the 21st before surgery on the 30th.  Based on how well current FBG readings are and that she will be fasting since midnight, I anticipate BG the morning of surgery will  be acceptable for surgery to carry out procedure.  [name], PharmD, DPLA

## 2023-11-21 ENCOUNTER — Other Ambulatory Visit: Payer: Self-pay | Admitting: Family Medicine

## 2023-11-25 ENCOUNTER — Other Ambulatory Visit: Payer: Self-pay

## 2023-11-25 ENCOUNTER — Other Ambulatory Visit: Payer: Self-pay | Admitting: Family Medicine

## 2023-11-25 MED ORDER — FREESTYLE LIBRE 3 PLUS SENSOR MISC: 4 refills | Status: AC

## 2023-11-25 MED ORDER — TIRZEPATIDE 7.5 MG/0.5ML ~~LOC~~ SOAJ: 7.5000 mg | SUBCUTANEOUS | 0 refills | Status: DC

## 2023-11-25 NOTE — Progress Notes (Signed)
   11/25/2023  Patient ID: Ann Torres, female   DOB: 30-May-1955, 68 y.o.   MRN: [id number]  Subjective/Objective: Telephone visit to follow-up on management of diabetes   Diabetes: Current medications: Mounjaro  7.5mg  weekly -Medications tried in the past: Did not tolerate Farxiga (yeast infections) or metformin  (GI upset) in the past -Using [place] 3 for CGM, and [employer] is needing a new order for [place] 3+ sensors, as Libre 3 is no longer being produced -Patient has had 4 doses of Mounjaro  7.5mg  weekly after changing over from Trulicity  4.5mg  and is tolerating well with no adverse side effects -Has not needed Humalog  with meals since starting Mounjaro  -She does endorse low readings during the night but also states she wears sensor where pressure may be being applied, which can cause false lows -Patient states that BG average over the last 30 days has been 139, FBG is averaging 125, and post-prandial will go up to 180 but comes back down after an hour or two -Feels s/sx of hypoglycemia when BG <115, likely due to being used to previous readings in the 300's -Endorses decreased appetite with change from Trulicity  to Mounjaro  -Upcoming surgical procedure next Wednesday, 7/30; so patient will not take Mounjaro  dose on Monday as she usually would -A1c 7.8% on 6/24  Assessment/Plan:  Diabetes: -I recommend staying at Mounjaro  7.5mg  weekly another 4 weeks until we verify low readings overnight are due to pressure being placed on sensor and not actual lows -Patient will use fingerstick to verify next low reading overnight -Sending refills for Libre 3+ to [employer] and Mounjaro  7.5mg  to [employer] (as requested by patient) -Sess PCP again 9/24 and will be due for A1c  Follow-up:  4 weeks  Ann Torres, PharmD, DPLA

## 2023-11-30 DIAGNOSIS — M19012 Primary osteoarthritis, left shoulder: Secondary | ICD-10-CM | POA: Diagnosis not present

## 2023-11-30 DIAGNOSIS — G8918 Other acute postprocedural pain: Secondary | ICD-10-CM | POA: Diagnosis not present

## 2023-11-30 DIAGNOSIS — M75102 Unspecified rotator cuff tear or rupture of left shoulder, not specified as traumatic: Secondary | ICD-10-CM | POA: Diagnosis not present

## 2023-11-30 DIAGNOSIS — M25712 Osteophyte, left shoulder: Secondary | ICD-10-CM | POA: Diagnosis not present

## 2023-12-06 DIAGNOSIS — M25612 Stiffness of left shoulder, not elsewhere classified: Secondary | ICD-10-CM | POA: Diagnosis not present

## 2023-12-06 DIAGNOSIS — M6281 Muscle weakness (generalized): Secondary | ICD-10-CM | POA: Diagnosis not present

## 2023-12-06 DIAGNOSIS — M25512 Pain in left shoulder: Secondary | ICD-10-CM | POA: Diagnosis not present

## 2023-12-08 DIAGNOSIS — M25512 Pain in left shoulder: Secondary | ICD-10-CM | POA: Diagnosis not present

## 2023-12-08 DIAGNOSIS — M25612 Stiffness of left shoulder, not elsewhere classified: Secondary | ICD-10-CM | POA: Diagnosis not present

## 2023-12-08 DIAGNOSIS — M6281 Muscle weakness (generalized): Secondary | ICD-10-CM | POA: Diagnosis not present

## 2023-12-09 DIAGNOSIS — M19012 Primary osteoarthritis, left shoulder: Secondary | ICD-10-CM | POA: Diagnosis not present

## 2023-12-13 DIAGNOSIS — M25612 Stiffness of left shoulder, not elsewhere classified: Secondary | ICD-10-CM | POA: Diagnosis not present

## 2023-12-13 DIAGNOSIS — M6281 Muscle weakness (generalized): Secondary | ICD-10-CM | POA: Diagnosis not present

## 2023-12-13 DIAGNOSIS — M25512 Pain in left shoulder: Secondary | ICD-10-CM | POA: Diagnosis not present

## 2023-12-16 DIAGNOSIS — M25612 Stiffness of left shoulder, not elsewhere classified: Secondary | ICD-10-CM | POA: Diagnosis not present

## 2023-12-16 DIAGNOSIS — M25512 Pain in left shoulder: Secondary | ICD-10-CM | POA: Diagnosis not present

## 2023-12-16 DIAGNOSIS — M6281 Muscle weakness (generalized): Secondary | ICD-10-CM | POA: Diagnosis not present

## 2023-12-20 DIAGNOSIS — M19012 Primary osteoarthritis, left shoulder: Secondary | ICD-10-CM | POA: Diagnosis not present

## 2023-12-21 DIAGNOSIS — M25612 Stiffness of left shoulder, not elsewhere classified: Secondary | ICD-10-CM | POA: Diagnosis not present

## 2023-12-21 DIAGNOSIS — M6281 Muscle weakness (generalized): Secondary | ICD-10-CM | POA: Diagnosis not present

## 2023-12-21 DIAGNOSIS — M25512 Pain in left shoulder: Secondary | ICD-10-CM | POA: Diagnosis not present

## 2023-12-23 ENCOUNTER — Other Ambulatory Visit: Payer: Self-pay

## 2023-12-23 DIAGNOSIS — M25512 Pain in left shoulder: Secondary | ICD-10-CM | POA: Diagnosis not present

## 2023-12-23 DIAGNOSIS — M25612 Stiffness of left shoulder, not elsewhere classified: Secondary | ICD-10-CM | POA: Diagnosis not present

## 2023-12-23 DIAGNOSIS — M6281 Muscle weakness (generalized): Secondary | ICD-10-CM | POA: Diagnosis not present

## 2023-12-26 ENCOUNTER — Other Ambulatory Visit: Payer: Self-pay

## 2023-12-26 DIAGNOSIS — E119 Type 2 diabetes mellitus without complications: Secondary | ICD-10-CM

## 2023-12-26 MED ORDER — TIRZEPATIDE 7.5 MG/0.5ML ~~LOC~~ SOAJ: 7.5000 mg | SUBCUTANEOUS | 1 refills | Status: DC

## 2023-12-26 NOTE — Progress Notes (Unsigned)
   12/26/2023  Patient ID: Ann Torres, female   DOB: [birth year], 68 y.o.   MRN: [id number]  Subjective/Objective: Telephone visit to follow-up on management of diabetes   Diabetes: Current medications: Mounjaro  7.5mg  weekly -Medications tried in the past: Did not tolerate Farxiga (yeast infections) or metformin  (GI upset) in the past -Using [place] 3 for CGM, -Patient has had 8 doses of Mounjaro  7.5mg  weekly after changing over from Trulicity  4.5mg  and is tolerating well with no adverse side effects -Has not needed Humalog  with meals since starting Mounjaro  -She does endorse a couple of low readings soon after her shoulder surgery 4 weeks ago, but states she was not eating much during this time and believes that led to some hypoglycemia -Patient states that FBG is averaging 120-130, with post-prandial not going higher than 180.  Libre reader shows she is in range of 70-180 92% of the time -Feels s/sx of hypoglycemia when BG <115, likely due to being used to previous readings in the 300's -Prefers to stay at Mounjaro  7.5mg  weekly another 4 weeks to prevent s/sx of hypoglycemia while her appetite remains decreased -Endorses decreased appetite with change from Trulicity  to Mounjaro  -A1c 7.8% on 6/24  Assessment/Plan:  Diabetes: -I recommend staying at Mounjaro  7.5mg  weekly another 4 weeks  -Sess PCP again 9/24 and will be due for A1c; if >7%, I recommend increasing Mounjaro  to 10mg  weekly  Follow-up:  2 months  [name], PharmD, DPLA

## 2023-12-28 DIAGNOSIS — M25612 Stiffness of left shoulder, not elsewhere classified: Secondary | ICD-10-CM | POA: Diagnosis not present

## 2023-12-28 DIAGNOSIS — M25512 Pain in left shoulder: Secondary | ICD-10-CM | POA: Diagnosis not present

## 2023-12-28 DIAGNOSIS — M6281 Muscle weakness (generalized): Secondary | ICD-10-CM | POA: Diagnosis not present

## 2023-12-30 DIAGNOSIS — M19012 Primary osteoarthritis, left shoulder: Secondary | ICD-10-CM | POA: Diagnosis not present

## 2024-01-03 ENCOUNTER — Encounter: Payer: Self-pay | Admitting: Sports Medicine

## 2024-01-03 DIAGNOSIS — M25612 Stiffness of left shoulder, not elsewhere classified: Secondary | ICD-10-CM | POA: Diagnosis not present

## 2024-01-03 DIAGNOSIS — M25512 Pain in left shoulder: Secondary | ICD-10-CM | POA: Diagnosis not present

## 2024-01-03 DIAGNOSIS — M6281 Muscle weakness (generalized): Secondary | ICD-10-CM | POA: Diagnosis not present

## 2024-01-06 DIAGNOSIS — M6281 Muscle weakness (generalized): Secondary | ICD-10-CM | POA: Diagnosis not present

## 2024-01-06 DIAGNOSIS — M25512 Pain in left shoulder: Secondary | ICD-10-CM | POA: Diagnosis not present

## 2024-01-06 DIAGNOSIS — M25612 Stiffness of left shoulder, not elsewhere classified: Secondary | ICD-10-CM | POA: Diagnosis not present

## 2024-01-12 DIAGNOSIS — M19012 Primary osteoarthritis, left shoulder: Secondary | ICD-10-CM | POA: Diagnosis not present

## 2024-01-16 NOTE — Progress Notes (Signed)
 HPI: FU CAD. Previous evaluation at [facility]: Nuclear study April 2023 showed ejection fraction 78% with small reversible perfusion abnormality in the lateral wall suggestive of ischemia. Coronary CTA May 2023 showed calcium  score 72.9 which was 66 percentile and mild nonobstructive coronary disease in the mid LAD, circumflex and distal right coronary artery.  Since last seen she has occasional dizziness with standing but no chest pain, dyspnea or syncope.  Current Outpatient Medications  Medication Sig Dispense Refill   Abatacept 125 MG/ML SOAJ Inject 1 Dose into the skin once a week. Sundays     albuterol  (VENTOLIN  HFA) 108 (90 Base) MCG/ACT inhaler Inhale 2 puffs into the lungs every 6 (six) hours as needed for wheezing or shortness of breath. 8.5 g 2   aspirin  EC 81 MG tablet Take 1 tablet (81 mg total) by mouth daily. Swallow whole.     atorvastatin  (LIPITOR) 20 MG tablet Take 1 tablet (20 mg total) by mouth at bedtime. 90 tablet 3   Calcium -Vitamin D -Vitamin K (VIACTIV CALCIUM  PLUS D) 650-12.5-40 MG-MCG-MCG CHEW Chew 2 tablets by mouth in the morning and at bedtime.     citalopram  (CELEXA ) 20 MG tablet Take 1 tablet (20 mg total) by mouth daily. 90 tablet 1   Continuous Glucose Sensor (FREESTYLE LIBRE 3 PLUS SENSOR) MISC Change sensor every 15 days. 6 each 4   EPINEPHrine  0.3 mg/0.3 mL IJ SOAJ injection Inject 0.3 mg into the muscle as needed for anaphylaxis. 1 each 1   estradiol  (ESTRACE  VAGINAL) 0.1 MG/GM vaginal cream Place 1 Applicatorful vaginally at bedtime. For 2 weeks then 3 times a week. 42.5 g 3   ezetimibe  (ZETIA ) 10 MG tablet Take 1 tablet (10 mg total) by mouth daily. 90 tablet 3   meloxicam  (MOBIC ) 15 MG tablet One tab PO every 24 hours with a meal for 2 weeks, then once every 24 hours prn pain. 30 tablet 3   omeprazole  (PRILOSEC) 20 MG capsule Take 1 capsule (20 mg total) by mouth daily. 90 capsule 3   ondansetron  (ZOFRAN ) 4 MG tablet Take 1 tablet (4 mg total) by mouth  every 8 (eight) hours as needed for nausea or vomiting. 20 tablet 1   Telmisartan -amLODIPine  80-5 MG TABS Take 1 tablet by mouth daily. 90 tablet 3   tirzepatide  (MOUNJARO ) 7.5 MG/0.5ML Pen Inject 7.5 mg into the skin once a week. Switching from Trulicity  4.5mg . 2 mL 1   traMADol  (ULTRAM ) 50 MG tablet Take 1 tablet (50 mg total) by mouth every 8 (eight) hours as needed for moderate pain (pain score 4-6). 21 tablet 0   triamcinolone  cream (KENALOG ) 0.1 % Apply 1 Application topically 2 (two) times daily. 80 g 0   No current facility-administered medications for this visit.     Past Medical History:  Diagnosis Date   Chronic rheumatic arthritis (HCC)    Diabetes mellitus without complication (HCC)    Hyperlipidemia    Hypertension     Past Surgical History:  Procedure Laterality Date   BACK SURGERY     BILATERAL OOPHORECTOMY     BREAST BIOPSY     in pts 30s   BREAST CYST EXCISION     Pt unsure which breast & unable to find a scar   CESAREAN SECTION     x 3   CHOLECYSTECTOMY     TOTAL ABDOMINAL HYSTERECTOMY     TUBAL LIGATION      Social History   Socioeconomic History   Marital status:  Divorced    Spouse name: Not on file   Number of children: 3   Years of education: 2 yr college   Highest education level: Associate degree: occupational, Scientist, product/process development, or vocational program  Occupational History   Occupation: retired  Tobacco Use   Smoking status: Former    Current packs/day: 0.00    Types: Cigarettes    Quit date: 08/01/2017    Years since quitting: 6.4   Smokeless tobacco: Never  Substance and Sexual Activity   Alcohol use: No   Drug use: No   Sexual activity: Never  Other Topics Concern   Not on file  Social History Narrative   Lives alone. She has three children. She enjoys playing tennis, sewing, painting stones and water aerobics.    Social Drivers of Corporate investment banker Strain: Low Risk  (10/23/2023)   Overall Financial Resource Strain (CARDIA)     Difficulty of Paying Living Expenses: Not very hard  Food Insecurity: No Food Insecurity (10/23/2023)   Hunger Vital Sign    Worried About Running Out of Food in the Last Year: Never true    Ran Out of Food in the Last Year: Never true  Transportation Needs: No Transportation Needs (10/23/2023)   PRAPARE - Administrator, Civil Service (Medical): No    Lack of Transportation (Non-Medical): No  Physical Activity: Sufficiently Active (10/23/2023)   Exercise Vital Sign    Days of Exercise per Week: 6 days    Minutes of Exercise per Session: 50 min  Stress: Stress Concern Present (10/23/2023)   [employer] of Occupational Health - Occupational Stress Questionnaire    Feeling of Stress: Rather much  Social Connections: Socially Isolated (10/23/2023)   Social Connection and Isolation Panel    Frequency of Communication with Friends and Family: Three times a week    Frequency of Social Gatherings with Friends and Family: More than three times a week    Attends Religious Services: Never    Database administrator or Organizations: No    Attends Engineer, structural: Not on file    Marital Status: Divorced  Intimate Partner Violence: Patient Declined (05/31/2023)   Received from [facility]   HITS    Over the last 12 months how often did your partner physically hurt you?: Patient declined    Over the last 12 months how often did your partner insult you or talk down to you?: Patient declined    Over the last 12 months how often did your partner threaten you with physical harm?: Patient declined    Over the last 12 months how often did your partner scream or curse at you?: Patient declined    Family History  Problem Relation Age of Onset   Colon cancer Mother    Heart attack Father 55    ROS: no fevers or chills, productive cough, hemoptysis, dysphasia, odynophagia, melena, hematochezia, dysuria, hematuria, rash, seizure activity, orthopnea, PND, pedal edema,  claudication. Remaining systems are negative.  Physical Exam: Well-developed well-nourished in no acute distress.  Skin is warm and dry.  HEENT is normal.  Neck is supple.  Chest is clear to auscultation with normal expansion.  Cardiovascular exam is regular rate and rhythm.  Abdominal exam nontender or distended. No masses palpated. Extremities show no edema. neuro grossly intact   A/P  1 chest pain-no recent symptoms.  2 coronary artery disease-mild on previous CTA.  Continue aspirin  and statin.  3 hyperlipidemia-continue statin.  4  hypertension-blood pressure is controlled.  However she is having some dizziness with standing.  Will discontinue amlodipine  and continue telmisartan .  Follow blood pressure and adjust medications as needed.  Redell Shallow, MD

## 2024-01-23 ENCOUNTER — Encounter: Payer: Self-pay | Admitting: Cardiology

## 2024-01-23 ENCOUNTER — Ambulatory Visit (INDEPENDENT_AMBULATORY_CARE_PROVIDER_SITE_OTHER): Admitting: Cardiology

## 2024-01-23 VITALS — BP 106/72 | HR 69 | Ht 64.0 in | Wt 162.0 lb

## 2024-01-23 DIAGNOSIS — I10 Essential (primary) hypertension: Secondary | ICD-10-CM

## 2024-01-23 DIAGNOSIS — R072 Precordial pain: Secondary | ICD-10-CM

## 2024-01-23 DIAGNOSIS — E7800 Pure hypercholesterolemia, unspecified: Secondary | ICD-10-CM

## 2024-01-23 MED ORDER — TELMISARTAN 80 MG PO TABS: 80.0000 mg | ORAL_TABLET | Freq: Every day | ORAL | 3 refills | Status: AC

## 2024-01-23 NOTE — Patient Instructions (Signed)
 Medication Instructions:   STOP TELMIKSARTAN/hydrochlorothiazide   START TELMISARTAN  80 MG ONCE DAILY  *If you need a refill on your cardiac medications before your next appointment, please call your pharmacy*   Follow-Up: At [facility], you and your health needs are our priority.  As part of our continuing mission to provide you with exceptional heart care, our providers are all part of one team.  This team includes your primary Cardiologist (physician) and Advanced Practice Providers or APPs (Physician Assistants and Nurse Practitioners) who all work together to provide you with the care you need, when you need it.  Your next appointment:   12 month(s)  Provider:   Redell Shallow, MD

## 2024-01-25 ENCOUNTER — Ambulatory Visit (INDEPENDENT_AMBULATORY_CARE_PROVIDER_SITE_OTHER): Admitting: Family Medicine

## 2024-01-25 ENCOUNTER — Encounter: Payer: Self-pay | Admitting: Family Medicine

## 2024-01-25 VITALS — BP 130/57 | HR 70 | Ht 64.0 in | Wt 162.0 lb

## 2024-01-25 DIAGNOSIS — E119 Type 2 diabetes mellitus without complications: Secondary | ICD-10-CM | POA: Diagnosis not present

## 2024-01-25 DIAGNOSIS — I10 Essential (primary) hypertension: Secondary | ICD-10-CM

## 2024-01-25 DIAGNOSIS — Z23 Encounter for immunization: Secondary | ICD-10-CM | POA: Diagnosis not present

## 2024-01-25 MED ORDER — TIRZEPATIDE 7.5 MG/0.5ML ~~LOC~~ SOAJ: 7.5000 mg | SUBCUTANEOUS | 0 refills | Status: AC

## 2024-01-25 NOTE — Progress Notes (Signed)
 Established Patient Office Visit  Subjective  Patient ID: Ann Torres, female    DOB: [birth year]  Age: 68 y.o. MRN: [id number]  Chief Complaint  Patient presents with   Diabetes    A1c 7.1%   Hypertension    HPI   Discussed the use of AI scribe software for clinical note transcription with the patient, who gave verbal consent to proceed.  History of Present Illness Ann Torres is a 68 year old female who presents with severe shoulder pain following recent shoulder surgery.  Postoperative shoulder pain - Severe, excruciating shoulder pain following recent shoulder surgery - Initially managed pain well with assistance from her daughter administering medications - Pain intensified after removal from sling at four weeks post-surgery - Pain has persisted for one week after sling removal, described as 'excruciating' despite high pain tolerance - Currently on a two-week rest period and has resumed use of the sling without pain relief - Pain is particularly severe at night, significantly disrupting sleep - Currently taking Tylenol  and Robaxin  (muscle relaxer) for pain management - Restarted physical therapy due to loss of mobility - Frustrated by setback in recovery, as she was previously making good progress - Desires to return to activity and travel, specifically to [place]  for the fall colors  Glycemic control - In target glucose range 84% of the time over the last 90 days - Hemoglobin A1c improved from 7.5 to 7.1 - No recent need for insulin  administration - Physical activity primarily consists of walking her dog  Cardiovascular and medication management - Amlodipine  recently stopped by Cardiology  - No chest pain or shortness of breath  Preventive care - Due for an eye exam     ROS    Objective:     BP (!) 130/57   Pulse 70   Ht 5' 4 (1.626 m)   Wt 162 lb (73.5 kg)   SpO2 100%   BMI 27.81 kg/m    Physical Exam Vitals and nursing note reviewed.   Constitutional:      Appearance: Normal appearance.  HENT:     Head: Normocephalic and atraumatic.  Eyes:     Conjunctiva/sclera: Conjunctivae normal.  Cardiovascular:     Rate and Rhythm: Normal rate and regular rhythm.  Pulmonary:     Effort: Pulmonary effort is normal.     Breath sounds: Normal breath sounds.  Skin:    General: Skin is warm and dry.  Neurological:     Mental Status: She is alert.  Psychiatric:        Mood and Affect: Mood normal.      No results found for any visits on 01/25/24.    The ASCVD Risk score (Arnett DK, et al., 2019) failed to calculate for the following reasons:   The valid total cholesterol range is 130 to 320 mg/dL   Unable to determine if patient is Non-Hispanic African American    Assessment & Plan:   Problem List Items Addressed This Visit       Cardiovascular and Mediastinum   Essential hypertension - Primary   BP at goal. Continue to monitor.         Endocrine   Diabetes mellitus without complication (HCC)   Other Visit Diagnoses       Encounter for immunization       Relevant Orders   Flu vaccine HIGH DOSE PF(Fluzone Trivalent) (Completed)      Assessment and Plan Assessment & Plan Acromioclavicular fracture and chronic pain, left  shoulder, status post left shoulder joint replacement with impaired mobility Severe left shoulder pain post-replacement, likely due to acromioclavicular fracture, causing impaired mobility. Pain unrelieved by Tylenol  or Robaxin . Surgeon advised against stronger medications for assessment. - Continue two-week rest period and sling use. - Restart physical therapy for mobility. - Consider short-term nighttime pain medication via MyChart.  Type 2 diabetes mellitus Diabetes well-controlled. A1c improved to 7.1%. In target range with CGM 84% of the time. No insulin  needed. - Continue current diabetes management. - Monitor blood glucose and A1c. - Adjust treatment if glucose control  worsens.  Impaired vision Worsening vision affecting driving. Due for eye exam. - Encouraged to schedule eye exam with [facility] for December or January.  General Health Maintenance Due for second shingles vaccine. Acceptable to delay until shoulder improves. COVID and flu vaccines received. - Receive second shingles vaccine by year-end when shoulder improves. - Get vaccine at pharmacy for Medicare coverage.   Return in about 4 months (around 05/26/2024) for Diabetes follow-up.    Dorothyann Byars, MD

## 2024-01-25 NOTE — Assessment & Plan Note (Addendum)
BP at goal. Continue to monitor. 

## 2024-01-26 DIAGNOSIS — M19012 Primary osteoarthritis, left shoulder: Secondary | ICD-10-CM | POA: Diagnosis not present

## 2024-02-02 ENCOUNTER — Other Ambulatory Visit: Payer: Self-pay | Admitting: Family Medicine

## 2024-02-02 DIAGNOSIS — M19012 Primary osteoarthritis, left shoulder: Secondary | ICD-10-CM | POA: Diagnosis not present

## 2024-02-02 DIAGNOSIS — F418 Other specified anxiety disorders: Secondary | ICD-10-CM

## 2024-02-02 DIAGNOSIS — Z96612 Presence of left artificial shoulder joint: Secondary | ICD-10-CM | POA: Diagnosis not present

## 2024-02-24 ENCOUNTER — Other Ambulatory Visit: Payer: Self-pay

## 2024-02-24 DIAGNOSIS — M6281 Muscle weakness (generalized): Secondary | ICD-10-CM | POA: Diagnosis not present

## 2024-02-24 DIAGNOSIS — E119 Type 2 diabetes mellitus without complications: Secondary | ICD-10-CM

## 2024-02-24 DIAGNOSIS — M25612 Stiffness of left shoulder, not elsewhere classified: Secondary | ICD-10-CM | POA: Diagnosis not present

## 2024-02-24 DIAGNOSIS — E785 Hyperlipidemia, unspecified: Secondary | ICD-10-CM

## 2024-02-24 DIAGNOSIS — M25512 Pain in left shoulder: Secondary | ICD-10-CM | POA: Diagnosis not present

## 2024-02-24 DIAGNOSIS — I10 Essential (primary) hypertension: Secondary | ICD-10-CM

## 2024-02-24 NOTE — Progress Notes (Signed)
   02/24/2024  Patient ID: Ann Torres, female   DOB: January 08, 1956, 68 y.o.   MRN: [id number]  Subjective/Objective: Telephone visit to follow-up on management of diabetes   Diabetes: Current medications: Mounjaro  7.5mg  weekly -Medications tried in the past: Did not tolerate Farxiga (yeast infections) or metformin  (GI upset) in the past -Using [place] 3 for CGM and is in target range 85% of the time; FBG averaging 110 -Does endorse occasional lows overnight if she forgets to have  snack before bedtime -A1c 7.1% 9/24, down from 7.8% previously -ACEi/ARB for cardiorenal protection:  telmisartan  80mg  daily- cardiology recently changed to this from telmisartan /amlodipine  due to some symptomatic hypotension (BP around 90/60 with lightheadedness).  Patient does monitor home BP, and states this is now averaging 11/78 with no s/sx of hypotension or hypertension -Statin for ASCVD risk reduction:  atorvastatin  20mg  daily; last LDL was 38 -UACR of 7 earlier this year  Assessment/Plan:  Diabetes: -A1c not at goal of <7%, but this has improved -BP, LDL, and UACR at goal -Continue current regimen at this time  Follow-up:  3 months  Ann Torres, PharmD, DPLA

## 2024-02-29 DIAGNOSIS — M25512 Pain in left shoulder: Secondary | ICD-10-CM | POA: Diagnosis not present

## 2024-02-29 DIAGNOSIS — M25612 Stiffness of left shoulder, not elsewhere classified: Secondary | ICD-10-CM | POA: Diagnosis not present

## 2024-02-29 DIAGNOSIS — M6281 Muscle weakness (generalized): Secondary | ICD-10-CM | POA: Diagnosis not present

## 2024-03-27 ENCOUNTER — Encounter: Payer: Self-pay | Admitting: Family Medicine

## 2024-05-20 ENCOUNTER — Other Ambulatory Visit: Payer: Self-pay | Admitting: Cardiology

## 2024-05-20 DIAGNOSIS — E781 Pure hyperglyceridemia: Secondary | ICD-10-CM

## 2024-05-23 NOTE — Telephone Encounter (Signed)
 Lipid Panel done on 10/03/23

## 2024-05-24 ENCOUNTER — Ambulatory Visit: Admitting: Family Medicine

## 2024-05-25 ENCOUNTER — Other Ambulatory Visit

## 2024-06-04 ENCOUNTER — Ambulatory Visit: Admitting: Family Medicine

## 2024-06-05 ENCOUNTER — Ambulatory Visit: Admitting: Family Medicine

## 2024-06-05 NOTE — Progress Notes (Unsigned)
" ° °  06/06/24  Patient ID: Ann Torres, female   DOB: 11-12-1955, 69 y.o.   MRN: [id number]  Subjective/Objective: Telephone visit to follow-up on management of diabetes   Diabetes: Current medications: Mounjaro  7.5mg  weekly -Medications tried in the past: Did not tolerate Farxiga (yeast infections) or metformin  (GI upset) in the past -Using [place] 3 for CGM and is in target range 91% of the time -Does endorse recent low alarms overnight, and when she does a fingerstick readings will be 20-25 units higher than what Herlene is reporting -Patient does report wearing Libre sensor on side she sleeps on at night -ACEi/ARB for cardiorenal protection:  telmisartan  80mg  daily- cardiology changed to this from telmisartan /amlodipine  due to some symptomatic hypotension (BP around 90/60 with lightheadedness).  Patient does monitor home BP, and states this is now within normal limits with no s/sx of hypotension or hypertension -Statin for ASCVD risk reduction:  atorvastatin  20mg  daily -UACR of 7 September of 2025  Lab Results  Component Value Date   HGBA1C 7.5 (A) 11/15/2023   HGBA1C 7.8 (A) 10/25/2023   HGBA1C 7.3 (A) 06/27/2023      Component Value Date/Time   NA 141 11/14/2023 0840   K 4.5 11/14/2023 0840   CL 105 11/14/2023 0840   CO2 18 (L) 11/14/2023 0840   GLUCOSE 169 (H) 11/14/2023 0840   GLUCOSE 125 (H) 06/22/2022 0844   BUN 14 11/14/2023 0840   CREATININE 0.72 11/14/2023 0840   CREATININE 0.67 06/22/2022 0844   CALCIUM  9.1 11/14/2023 0840   PROT 6.6 11/14/2023 0840   ALBUMIN 4.3 11/14/2023 0840   AST 24 11/14/2023 0840   ALT 19 11/14/2023 0840   ALKPHOS 125 (H) 11/14/2023 0840   BILITOT 0.6 11/14/2023 0840   EGFR 90 11/14/2023 0840   GFRNONAA 91 08/04/2020 0845      Component Value Date/Time   CHOL 111 10/03/2023 0816   TRIG 97 10/03/2023 0816   HDL 55 10/03/2023 0816   CHOLHDL 2.0 10/03/2023 0816   CHOLHDL 2.3 10/30/2021 0000   LDLCALC 38 10/03/2023 0816   LDLCALC 41  10/30/2021 0000   LABVLDL 18 10/03/2023 0816     Assessment/Plan:  Diabetes: -A1c not at goal of <7%, but home BG reflects good control -BP at goal of <130/80 -LDL at goal of <70 -UACR at goal -Continue current regimen at this time -Continue Libre 3+ for CGM- advised patient to try sensor on other arm (side she does not sleep on) to see if this prevents false lows overnight -PCP again 3/4  Follow-up:  6 months  [name], PharmD, DPLA    "

## 2024-06-06 ENCOUNTER — Other Ambulatory Visit: Payer: Self-pay

## 2024-06-06 DIAGNOSIS — E785 Hyperlipidemia, unspecified: Secondary | ICD-10-CM

## 2024-06-06 DIAGNOSIS — I10 Essential (primary) hypertension: Secondary | ICD-10-CM

## 2024-06-06 DIAGNOSIS — E119 Type 2 diabetes mellitus without complications: Secondary | ICD-10-CM

## 2024-07-04 ENCOUNTER — Ambulatory Visit: Admitting: Family Medicine

## 2024-12-04 ENCOUNTER — Other Ambulatory Visit
